# Patient Record
Sex: Female | Born: 1950
Health system: Southern US, Community
[De-identification: ages and names within clinical notes are randomized; demographics above are authoritative.]

## PROBLEM LIST (undated history)

## (undated) DIAGNOSIS — E039 Hypothyroidism, unspecified: Secondary | ICD-10-CM

## (undated) DIAGNOSIS — M5136 Other intervertebral disc degeneration, lumbar region: Secondary | ICD-10-CM

## (undated) DIAGNOSIS — I10 Essential (primary) hypertension: Secondary | ICD-10-CM

## (undated) DIAGNOSIS — K219 Gastro-esophageal reflux disease without esophagitis: Secondary | ICD-10-CM

## (undated) DIAGNOSIS — E079 Disorder of thyroid, unspecified: Secondary | ICD-10-CM

## (undated) DIAGNOSIS — T7840XA Allergy, unspecified, initial encounter: Secondary | ICD-10-CM

## (undated) DIAGNOSIS — M51369 Other intervertebral disc degeneration, lumbar region without mention of lumbar back pain or lower extremity pain: Secondary | ICD-10-CM

## (undated) DIAGNOSIS — IMO0002 Reserved for concepts with insufficient information to code with codable children: Secondary | ICD-10-CM

## (undated) DIAGNOSIS — J45909 Unspecified asthma, uncomplicated: Secondary | ICD-10-CM

## (undated) HISTORY — DX: Reserved for concepts with insufficient information to code with codable children: IMO0002

## (undated) HISTORY — PX: DILATION AND CURETTAGE OF UTERUS: SHX78

## (undated) HISTORY — DX: Allergy, unspecified, initial encounter: T78.40XA

## (undated) HISTORY — PX: ABDOMINAL HYSTERECTOMY: SHX81

---

## 2000-09-16 ENCOUNTER — Other Ambulatory Visit: Admission: RE | Admit: 2000-09-16 | Discharge: 2000-09-16 | Payer: Self-pay | Admitting: Obstetrics and Gynecology

## 2000-09-30 ENCOUNTER — Encounter: Payer: Self-pay | Admitting: Obstetrics and Gynecology

## 2000-09-30 ENCOUNTER — Ambulatory Visit (HOSPITAL_COMMUNITY): Admission: RE | Admit: 2000-09-30 | Discharge: 2000-09-30 | Payer: Self-pay | Admitting: Obstetrics and Gynecology

## 2000-12-17 ENCOUNTER — Encounter (INDEPENDENT_AMBULATORY_CARE_PROVIDER_SITE_OTHER): Payer: Self-pay

## 2000-12-17 ENCOUNTER — Observation Stay (HOSPITAL_COMMUNITY): Admission: RE | Admit: 2000-12-17 | Discharge: 2000-12-18 | Payer: Self-pay | Admitting: Obstetrics and Gynecology

## 2003-10-18 ENCOUNTER — Ambulatory Visit (HOSPITAL_COMMUNITY): Admission: RE | Admit: 2003-10-18 | Discharge: 2003-10-18 | Payer: Self-pay | Admitting: Family Medicine

## 2004-04-25 ENCOUNTER — Ambulatory Visit: Payer: Self-pay | Admitting: Family Medicine

## 2004-11-30 ENCOUNTER — Ambulatory Visit: Payer: Self-pay | Admitting: Family Medicine

## 2005-07-12 ENCOUNTER — Ambulatory Visit: Payer: Self-pay | Admitting: Family Medicine

## 2005-07-24 ENCOUNTER — Ambulatory Visit: Payer: Self-pay | Admitting: Family Medicine

## 2005-08-28 ENCOUNTER — Ambulatory Visit: Payer: Self-pay | Admitting: Family Medicine

## 2005-09-06 ENCOUNTER — Ambulatory Visit: Payer: Self-pay | Admitting: Family Medicine

## 2006-01-22 ENCOUNTER — Ambulatory Visit: Payer: Self-pay | Admitting: Family Medicine

## 2006-02-18 ENCOUNTER — Ambulatory Visit: Payer: Self-pay | Admitting: Family Medicine

## 2006-03-05 ENCOUNTER — Ambulatory Visit: Payer: Self-pay | Admitting: Family Medicine

## 2006-07-02 ENCOUNTER — Ambulatory Visit: Payer: Self-pay | Admitting: Family Medicine

## 2010-06-23 NOTE — Op Note (Signed)
[facility]  Patient:    Diane Grant, Diane Grant Visit Number: [id number] MRN: [id number]          Service Type: GYN Location: 9300 [id number] Attending Physician:  Leonard Schwartz Dictated by:   Diane Grant, M.D. Proc. Date: 12/17/00 Admit Date:  12/17/2000                             Operative Report  PREOPERATIVE DIAGNOSES:       1. Fibroid uterus.                               2. Menorrhagia.                               3. Dysmenorrhea.                               4. Dyspareunia.  POSTOPERATIVE DIAGNOSES:      1. Fibroid uterus.                               2. Menorrhagia.                               3. Dysmenorrhea.                               4. Dyspareunia.  PROCEDURE:                    1. Vaginal hysterectomy.                               2. Bilateral salpingo-oophorectomy.                               3. Cystoscopy.  SURGEON:                      Diane Grant, M.D.  FIRST ASSISTANT:              Elmira J. Lowell Guitar, PA-C  ANESTHESIA:                   General.  DISPOSITION:                  Ms. Diane Grant is a 60 year old female para 3-0-0-3 who presents with the above mentioned diagnoses.  She understands the indications for her procedure and she accepts the risks of, but not limited to, anesthetic complications, bleeding, infections, and possible damage to the surrounding organs.  FINDINGS:                     The uterus was upper limits normal size.  There were two small fibroids noted in the fundus of the uterus.  The fallopian tubes and the ovaries appeared normal.  Cystoscopy was performed at the end of our procedure.  The patient was noted to have dye that extruded from both ureteral orifices.  The bladder was inspected and there  was no evidence of damage.  PROCEDURE:                    The patient was taken to the operating room where a general anesthetic was given.  The patients abdomen, perineum,  and vagina were prepped with multiple layers of Betadine.  A Foley catheter was placed in the bladder.  Examination under anesthesia was performed.  The patient was sterilely draped.  A weighted speculum was placed in the posterior vagina.  The cervix was injected with 40 cc of a diluted solution of Pitressin and saline.  A circumferential incision was made around the cervix and the mucosa was advanced both anteriorly and posteriorly.  The anterior cul-de-sac was sharply entered.  The posterior cul-de-sac was sharply entered. Alternating from right to the left the uterosacral ligaments, paracervical tissues, parametrial tissues, and uterine arteries were clamped, cut, sutured, and tied securely.  The uterus was inverted through the posterior colpotomy. The upper pedicles were clamped and cut.  The uterus was removed from the operative field.  The upper pedicles were then carefully inspected.  The left ovary was brought into the operative field as was the left fallopian tube. The left infundibulopelvic ligament was identified and then clamped.  The fallopian tube and the ovary were cut and removed from the operative field. The left infundibulopelvic ligament was secured using a free tie and then a suture ligature.  An identical procedure was carried out on the opposite side. Hemostasis was achieved in the pelvis using figure-of-eight sutures.  The sutures attached to the uterosacral ligament were then brought out through the vaginal angles and tied securely.  A McCall culdoplasty suture was placed in the posterior cul-de-sac incorporating the uterosacral ligaments bilaterally and the posterior peritoneum.  A final check was made for hemostasis and hemostasis was noted to be adequate in the pelvis.  The vaginal cuff was closed using figure-of-eight sutures incorporating the anterior vaginal mucosa, the anterior peritoneum, the posterior peritoneum, and then the posterior vaginal mucosa.  The  McCall culdoplasty suture was tied securely and the apex of the vagina was noted to elevate into the mid pelvis.  Vicryl 0 was the suture material used throughout the procedure.  Sponge, needle, and instrument counts were correct on two occasions.  The patient was noted to drain clear yellow urine.  The estimated blood loss for the procedure was 150 cc.  We then gave the patient indigo carmine dye.  The diagnostic cystoscope was placed in the bladder and blue dye was noted to come from both ureteral orifices.  The dome of the bladder appeared normal.  The Foley catheter was then reinserted.  The patient was awakened from her general anesthetic and taken to the recovery room in stable condition. Dictated by:   Diane Grant, M.D. Attending Physician:  Leonard Schwartz DD:  12/17/00 TD:  12/17/00 Job: [id number]

## 2010-06-23 NOTE — Discharge Summary (Signed)
[facility]  Patient:    Diane Grant, Diane Grant Visit Number: [id number] MRN: [id number]          Service Type: GYN Location: 9300 [id number] Attending Physician:  Leonard Schwartz Dictated by:   Henreitta Leber, P.A.-C Admit Date:  12/17/2000 Discharge Date: 12/18/2000                             Discharge Summary  DISCHARGE DIAGNOSES: 1. Fibroid uterus. 2. Menorrhagia. 3. Dysmenorrhea. 4. Dyspareunia. 5. Hypothyroidism. 6. Anemia.  OPERATION:  On the date of admission the patient underwent a total vaginal hysterectomy with a bilateral salpingo-oophorectomy, followed by cystoscopy. The patient tolerated all procedures well.  HISTORY OF PRESENT ILLNESS:  Diane Grant is a 60 year old female, para 3-0-0-3, who presents for vaginal hysterectomy because of dysmenorrhea, menorrhagia, and fibroids.  Please see the patients dictated history and physical examination for details.  PHYSICAL EXAMINATION:  VITAL SIGNS:  Weight 151 pounds.  GENERAL:  Within normal limits.  PELVIC:  EGBUS is within normal limits.  Vagina is normal except for pelvic relaxation.  Uterus is 10 to 12 weeks size, irregular, and firm.  Adnexa without masses.  Rectovaginal examination confirms.  HOSPITAL COURSE:  On the date of admission the patient underwent the afore mentioned procedures, tolerating them all well.  Postoperative course was marked by a spike in temperature on postoperative day #1 to 100.6 degrees Fahrenheit orally, however, the patient quickly defervesced.  By the afternoon of postoperative day #1, the patient had resumed bowel and bladder function, and was deemed ready for discharge home.  Postoperative hemoglobin 10.5 (preoperative hemoglobin 12.8).  DISCHARGE MEDICATIONS: 1. Vicodin one or two tablets q.4-6h. p.r.n. pain. 2. Iron 325 mg one tablet b.i.d. x 6 weeks. 3. Ibuprofen 600 mg one tablet with food q.6h. x 5 days, then p.r.n. 4. Phenergan  12.5 mg one tablet q.i.d. p.r.n. for nausea. 5. Stool softeners 100 mg b.i.d. until bowel movements are regular. 6. Synthroid 100 mcg q.d.  DISCHARGE INSTRUCTIONS: 1. The patient was given a copy of [facility] Gynecology    postoperative instruction sheet. 2. She was further advised to avoid driving for 2 weeks, heavy lifting for 4    weeks, and intercourse for 6 weeks.  FOLLOWUP:  The patient is to call [facility] for a 6 week postoperative examination with Dr. Kirkland Hun.  FINAL PATHOLOGY: 1. Uterus:  Nonspecific chronic erosive endocervicitis, benign proliferative    endometrium associated with benign endometrial polyps (two), and benign    uterine leiomyoma. 2. Right ovary and fallopian tube:  Ovary and fallopian tube with no    pathologic abnormalities identified. 3. Left ovary and fallopian tube:  Tube and ovary with few fibrous adhesions. Dictated by:   Henreitta Leber, P.A.-C Attending Physician:  Leonard Schwartz DD:  12/31/00 TD:  12/31/00 Job: [id number]

## 2010-06-23 NOTE — H&P (Signed)
[facility]  Patient:    Diane Grant, Diane Grant Visit Number: [id number] MRN: [id number]          Service Type: Attending:  Janine Limbo, M.D. Dictated by:   Janine Limbo, M.D. Adm. Date:  12/17/00                           History and Physical  HISTORY OF PRESENT ILLNESS:   Ms. Romeo Apple is a 60 year old female, para 3-0-0-3, who presents for a vaginal hysterectomy because of dysmenorrhea, menorrhagia, and fibroids.  An endometrial biopsy was performed and it showed benign endometrial tissue.  Her most recent Pap smear was within normal limits.  Gonorrhea, chlamydia, and urine cultures were negative.  An ultrasound showed normal ovaries.  The uterus had two fibroids noted.  The patient has had a tubal ligation in the past.  She has tried hormonal management, as well as nonsteroidal anti-inflammatory agents.  These have not managed her discomfort.  She wants to proceed with definitive therapy.  PAST MEDICAL HISTORY:         The patient has a history of hypothyroidism and she currently takes Synthroid 100 mcg each day.  She had a broken leg as a child, but has no sequelae from this.  She has been told that her cholesterol was slightly high.  OBSTETRICAL HISTORY:          The patient has had three vaginal deliveries at term.  SOCIAL HISTORY:               The patient denies cigarette use, alcohol use, and recreational drug use.  DRUG ALLERGIES:               No known drug allergies.  REVIEW OF SYSTEMS:            The patient does complain of dyspareunia.  She has pelvic pressure symptoms and urinary incontinence.  FAMILY HISTORY:               Noncontributory.  PHYSICAL EXAMINATION:         Weight is 151 pounds.  HEENT:                        Within normal limits.  CHEST:                        Clear.  HEART:                        Regular rate and rhythm.  BREASTS:                      Without masses.  ABDOMEN:                       Nontender.  EXTREMITIES:                  Within normal limits.  NEUROLOGIC EXAMINATION:       Grossly normal.  PELVIC EXAMINATION:           External genitalia are normal.  Vagina is normal, except for pelvic relaxation.  Uterus is 10-12 week size, irregular, and firm.  Adnexa no masses and rectovaginal examination confirms.  ASSESSMENT:                   1. Fibroid uterus.  2. Menorrhagia.                               3. Dysmenorrhea.                               4. Dyspareunia.                               5. Hypothyroidism.  PLAN:                         The patient will undergo a vaginal hysterectomy. She understands the indications for her procedure and she accepts the risks of, but not limited to, anesthetic complications, bleeding, infection, and possible damage to the surrounding organs.  We discussed the merit of bilateral oophorectomy.  The patient declined.  We also discussed surgical repair of urinary incontinence and the patient declined. Dictated by:   Janine Limbo, M.D. Attending:  Janine Limbo, M.D. DD:  12/16/00 TD:  12/16/00 Job: [id number]

## 2013-06-26 DIAGNOSIS — E079 Disorder of thyroid, unspecified: Secondary | ICD-10-CM | POA: Insufficient documentation

## 2013-06-26 DIAGNOSIS — I10 Essential (primary) hypertension: Secondary | ICD-10-CM | POA: Insufficient documentation

## 2013-06-26 DIAGNOSIS — E039 Hypothyroidism, unspecified: Secondary | ICD-10-CM | POA: Insufficient documentation

## 2013-10-07 ENCOUNTER — Other Ambulatory Visit: Payer: Self-pay

## 2013-10-07 DIAGNOSIS — Z1231 Encounter for screening mammogram for malignant neoplasm of breast: Secondary | ICD-10-CM

## 2013-10-22 ENCOUNTER — Ambulatory Visit
Admission: RE | Admit: 2013-10-22 | Discharge: 2013-10-22 | Disposition: A | Discharge status: Home / Self Care | Payer: BC Managed Care – PPO | Source: Ambulatory Visit

## 2013-10-22 DIAGNOSIS — Z1231 Encounter for screening mammogram for malignant neoplasm of breast: Secondary | ICD-10-CM

## 2014-05-06 ENCOUNTER — Encounter (HOSPITAL_COMMUNITY): Payer: Self-pay | Admitting: *Deleted

## 2014-05-06 ENCOUNTER — Emergency Department (HOSPITAL_COMMUNITY)
Admission: EM | Admit: 2014-05-06 | Discharge: 2014-05-06 | Disposition: A | Discharge status: Home / Self Care | Payer: BLUE CROSS/BLUE SHIELD | Attending: Emergency Medicine | Admitting: Emergency Medicine

## 2014-05-06 ENCOUNTER — Emergency Department (HOSPITAL_COMMUNITY): Payer: BLUE CROSS/BLUE SHIELD

## 2014-05-06 DIAGNOSIS — I10 Essential (primary) hypertension: Secondary | ICD-10-CM | POA: Insufficient documentation

## 2014-05-06 DIAGNOSIS — Z79899 Other long term (current) drug therapy: Secondary | ICD-10-CM | POA: Insufficient documentation

## 2014-05-06 DIAGNOSIS — R2243 Localized swelling, mass and lump, lower limb, bilateral: Secondary | ICD-10-CM | POA: Insufficient documentation

## 2014-05-06 DIAGNOSIS — E039 Hypothyroidism, unspecified: Secondary | ICD-10-CM | POA: Diagnosis not present

## 2014-05-06 DIAGNOSIS — R0789 Other chest pain: Secondary | ICD-10-CM | POA: Insufficient documentation

## 2014-05-06 DIAGNOSIS — R079 Chest pain, unspecified: Secondary | ICD-10-CM | POA: Diagnosis present

## 2014-05-06 DIAGNOSIS — Z8739 Personal history of other diseases of the musculoskeletal system and connective tissue: Secondary | ICD-10-CM | POA: Insufficient documentation

## 2014-05-06 HISTORY — DX: Other intervertebral disc degeneration, lumbar region without mention of lumbar back pain or lower extremity pain: M51.369

## 2014-05-06 HISTORY — DX: Disorder of thyroid, unspecified: E07.9

## 2014-05-06 HISTORY — DX: Other intervertebral disc degeneration, lumbar region: M51.36

## 2014-05-06 HISTORY — DX: Essential (primary) hypertension: I10

## 2014-05-06 LAB — CBC
HEMATOCRIT: 42.9 % (ref 36.0–46.0)
HEMOGLOBIN: 15 g/dL (ref 12.0–15.0)
MCH: 31.1 pg (ref 26.0–34.0)
MCHC: 35 g/dL (ref 30.0–36.0)
MCV: 89 fL (ref 78.0–100.0)
Platelets: 271 10*3/uL (ref 150–400)
RBC: 4.82 MIL/uL (ref 3.87–5.11)
RDW: 12.6 % (ref 11.5–15.5)
WBC: 9.4 10*3/uL (ref 4.0–10.5)

## 2014-05-06 LAB — COMPREHENSIVE METABOLIC PANEL
ALK PHOS: 70 U/L (ref 39–117)
ALT: 20 U/L (ref 0–35)
AST: 28 U/L (ref 0–37)
Albumin: 4.1 g/dL (ref 3.5–5.2)
Anion gap: 12 (ref 5–15)
BUN: 9 mg/dL (ref 6–23)
CO2: 25 mmol/L (ref 19–32)
Calcium: 9.8 mg/dL (ref 8.4–10.5)
Chloride: 103 mmol/L (ref 96–112)
Creatinine, Ser: 0.69 mg/dL (ref 0.50–1.10)
GLUCOSE: 91 mg/dL (ref 70–99)
POTASSIUM: 3.9 mmol/L (ref 3.5–5.1)
Sodium: 140 mmol/L (ref 135–145)
Total Bilirubin: 0.5 mg/dL (ref 0.3–1.2)
Total Protein: 7.4 g/dL (ref 6.0–8.3)

## 2014-05-06 LAB — BRAIN NATRIURETIC PEPTIDE: B Natriuretic Peptide: 41.2 pg/mL (ref 0.0–100.0)

## 2014-05-06 LAB — I-STAT TROPONIN, ED: Troponin i, poc: 0 ng/mL (ref 0.00–0.08)

## 2014-05-06 NOTE — ED Provider Notes (Signed)
CSN: [id number]     Arrival date & time 05/06/14  1608 History   First MD Initiated Contact with Patient 05/06/14 1721     Chief Complaint  Patient presents with  . Chest Pain   Diane Grant is a 65 y.o. female with a history of hypertension, degenerative disc disease and hypothyroidism who presents the emergency department complaining of sharp left-sided chest pain that lasted seconds several hours ago. Now she reports having an uncomfortable feeling in her chest, and denies pain. The patient reports she was standing around 11 AM when she had sharp chest pain in the left side of her chest that resolved within seconds. She reports that since she just has an uncomfortable feeling in her chest. She denies any shortness of breath today or associated with this episode. Currently she rates her pain at 0 out of 10. She reports lots of belching today. The patient reports some bilateral ankle edema for many years that has not worsened or changed. The patient reports she came to the emergency department this evening because her primary care provider nurse encouraged her to do so. Patient denies having any pain on exertion. The patient denies history of MI. The patient does report a history of a brother with an MI in his 51s. The patient denies fevers, chills, cough, shortness of breath, palpitations, abdominal pain, nausea, vomiting, recent surgeries, exogenous estrogen use, smoking, or long travel. The patient denies personal or family history of DVTs or PEs. The patient denies personal or family history of blood clotting disorders such as factor V Leiden, protein C or S deficiency.  (Consider location/radiation/quality/duration/timing/severity/associated sxs/prior Treatment) HPI  Past Medical History  Diagnosis Date  . Thyroid disease     hypothyroidism  . Hypertension   . Degenerative disc disease, lumbar    Past Surgical History  Procedure Laterality Date  . Abdominal hysterectomy     No  family history on file. History  Substance Use Topics  . Smoking status: Never Smoker   . Smokeless tobacco: Not on file  . Alcohol Use: No   OB History    No data available     Review of Systems  Constitutional: Negative for fever and chills.  HENT: Negative for congestion, ear pain and sore throat.   Eyes: Negative for pain and visual disturbance.  Respiratory: Negative for cough, shortness of breath and wheezing.   Cardiovascular: Positive for chest pain. Negative for palpitations and leg swelling.  Gastrointestinal: Negative for nausea, vomiting, abdominal pain and diarrhea.  Genitourinary: Negative for dysuria.  Musculoskeletal: Negative for back pain and neck pain.  Skin: Negative for rash.  Neurological: Negative for dizziness, weakness, light-headedness, numbness and headaches.      Allergies  Review of patient's allergies indicates no known allergies.  Home Medications   Prior to Admission medications   Medication Sig Start Date End Date Taking? Authorizing Provider  levothyroxine (SYNTHROID, LEVOTHROID) 75 MCG tablet Take 75 mcg by mouth daily before breakfast.   Yes Historical Provider, MD  losartan-hydrochlorothiazide (HYZAAR) 100-25 MG per tablet Take 1 tablet by mouth daily.   Yes Historical Provider, MD   BP 121/74 mmHg  Pulse 90  Temp(Src) 98.3 F (36.8 C) (Oral)  Resp 24  Ht 5\' 3"  (1.6 m)  Wt 169 lb (76.658 kg)  BMI 29.94 kg/m2  SpO2 100% Physical Exam  Constitutional: She is oriented to person, place, and time. She appears well-developed and well-nourished. No distress.  Nontoxic appearing.  HENT:  Head: Normocephalic  and atraumatic.  Right Ear: External ear normal.  Left Ear: External ear normal.  Mouth/Throat: Oropharynx is clear and moist. No oropharyngeal exudate.  Eyes: Conjunctivae are normal. Pupils are equal, round, and reactive to light. Right eye exhibits no discharge. Left eye exhibits no discharge.  Neck: Neck supple. No JVD present.  No tracheal deviation present.  Cardiovascular: Normal rate, regular rhythm, normal heart sounds and intact distal pulses.  Exam reveals no gallop and no friction rub.   No murmur heard. Bilateral radial, posterior tibialis and dorsalis pedis pulses are intact.   Pulmonary/Chest: Effort normal and breath sounds normal. No respiratory distress. She has no wheezes. She has no rales. She exhibits no tenderness.  Abdominal: Soft. Bowel sounds are normal. She exhibits no distension. There is no tenderness.  Musculoskeletal: She exhibits edema. She exhibits no tenderness.  Very mild bilateral ankle edema. No calf edema or tenderness.  Lymphadenopathy:    She has no cervical adenopathy.  Neurological: She is alert and oriented to person, place, and time. Coordination normal.  Skin: Skin is warm and dry. No rash noted. She is not diaphoretic. No erythema. No pallor.  Psychiatric: She has a normal mood and affect. Her behavior is normal.  Nursing note and vitals reviewed.   ED Course  Procedures (including critical care time) Labs Review Labs Reviewed  CBC  BRAIN NATRIURETIC PEPTIDE  COMPREHENSIVE METABOLIC PANEL  I-STAT TROPOININ, ED    Imaging Review Dg Chest 2 View  05/06/2014   CLINICAL DATA:  64 year old female with left-sided chest pain and posterior left shoulder pain with some associated shortness of breath for 1 day.  EXAM: CHEST  2 VIEW  COMPARISON:  Chest x-ray 03/24/2009.  FINDINGS: Lung volumes are normal. No consolidative airspace disease. No pleural effusions. No pneumothorax. No pulmonary nodule or mass noted. Pulmonary vasculature and the cardiomediastinal silhouette are within normal limits.  IMPRESSION: No radiographic evidence of acute cardiopulmonary disease.   Electronically Signed   By: Vinnie Langton M.D.   On: 05/06/2014 17:08     EKG Interpretation   Date/Time:  Thursday May 06 2014 16:29:10 EDT Ventricular Rate:  84 PR Interval:  150 QRS Duration: 78 QT  Interval:  360 QTC Calculation: 425 R Axis:   55 Text Interpretation:  Normal sinus rhythm with sinus arrhythmia Normal ECG  No previous tracing Confirmed by ZACKOWSKI  MD, SCOTT ([id number]) on 05/06/2014  7:04:15 PM      Filed Vitals:   05/06/14 1630 05/06/14 1800 05/06/14 1815 05/06/14 1830  BP:  103/71 107/73 121/74  Pulse:  78 77 90  Temp:      TempSrc:      Resp:  19 10 24   Height: 5\' 3"  (1.6 m)     Weight: 169 lb (76.658 kg)     SpO2:  98% 98% 100%     MDM   Meds given in ED:  Medications - No data to display  New Prescriptions   No medications on file    Final diagnoses:  Atypical chest pain   This is a 64 y.o. female with a history of hypertension, degenerative disc disease and hypothyroidism who presents the emergency department complaining of sharp left-sided chest pain that lasted seconds several hours ago. Now she reports having an uncomfortable feeling in her chest, and denies pain she denies any shortness of breath. Patient has chronic ankle edema that is not worsened or changed. Patient is afebrile and nontoxic appearing. She has a HEART score of 2.  Patient is to be discharged with recommendation to follow up with PCP in regards to today's hospital visit. Chest pain is not likely of cardiac or pulmonary etiology d/t presentation, VSS, no tracheal deviation, no JVD or new murmur, RRR, breath sounds equal bilaterally, EKG without acute abnormalities, negative troponin, and negative CXR. Pt has been advised to return to the ED if CP becomes exertional, associated with diaphoresis or nausea, radiates to left jaw/arm, worsens or becomes concerning in any way. Pt appears reliable for follow up and is agreeable to discharge. I advised the patient to follow-up with their primary care provider this week. I advised the patient to return to the emergency department with new or worsening symptoms or new concerns. The patient verbalized understanding and agreement with plan.     This patient was discussed with Dr. Rogene Houston who agrees with assessment and plan.   Waynetta Pean, PA-C 05/06/14 1915  Fredia Sorrow, MD 05/11/14 [phone]

## 2014-05-06 NOTE — ED Notes (Addendum)
Pt states pain from L scapula to L shoulder and pain sharp pain behind L breast that is sharp and takes her breath away.  States hx of chronic back pain, but this does not feel the same.  Pt also c/o LE swelling and sob (which she attributed to allergies).

## 2014-05-06 NOTE — ED Notes (Signed)
Pt reports she was standing talking to her husband this afternoon when she had a sudden sharp pain right behind her left breast.  Pt reports the pain took her breath away and lasted aprox. 1 minute.  Pt denies any n/v/d, dizziness or diaphoresis.  Pt denies any pain at this time.

## 2014-05-06 NOTE — Discharge Instructions (Signed)

## 2015-06-15 ENCOUNTER — Other Ambulatory Visit (HOSPITAL_COMMUNITY): Payer: Self-pay | Admitting: Physician Assistant

## 2015-06-15 DIAGNOSIS — Z1231 Encounter for screening mammogram for malignant neoplasm of breast: Secondary | ICD-10-CM

## 2015-09-16 ENCOUNTER — Ambulatory Visit (HOSPITAL_COMMUNITY)
Admission: RE | Admit: 2015-09-16 | Discharge: 2015-09-16 | Disposition: A | Discharge status: Home / Self Care | Payer: Medicare Other | Source: Ambulatory Visit | Attending: Physician Assistant | Admitting: Physician Assistant

## 2015-09-16 DIAGNOSIS — Z1231 Encounter for screening mammogram for malignant neoplasm of breast: Secondary | ICD-10-CM | POA: Insufficient documentation

## 2015-10-26 ENCOUNTER — Ambulatory Visit (HOSPITAL_COMMUNITY)
Admission: RE | Admit: 2015-10-26 | Discharge: 2015-10-26 | Disposition: A | Discharge status: Home / Self Care | Payer: Medicare Other | Source: Ambulatory Visit | Attending: Family Medicine | Admitting: Family Medicine

## 2015-10-26 ENCOUNTER — Other Ambulatory Visit (HOSPITAL_COMMUNITY): Payer: Self-pay | Admitting: Family Medicine

## 2015-10-26 DIAGNOSIS — M12811 Other specific arthropathies, not elsewhere classified, right shoulder: Secondary | ICD-10-CM | POA: Diagnosis not present

## 2015-10-26 DIAGNOSIS — M719 Bursopathy, unspecified: Secondary | ICD-10-CM

## 2015-11-15 ENCOUNTER — Other Ambulatory Visit (HOSPITAL_COMMUNITY): Payer: Self-pay | Admitting: Family Medicine

## 2015-11-15 DIAGNOSIS — M719 Bursopathy, unspecified: Secondary | ICD-10-CM

## 2015-11-21 ENCOUNTER — Ambulatory Visit (HOSPITAL_COMMUNITY)
Admission: RE | Admit: 2015-11-21 | Discharge: 2015-11-21 | Disposition: A | Discharge status: Home / Self Care | Payer: Medicare Other | Source: Ambulatory Visit | Attending: Family Medicine | Admitting: Family Medicine

## 2015-11-21 DIAGNOSIS — M719 Bursopathy, unspecified: Secondary | ICD-10-CM | POA: Diagnosis present

## 2015-11-21 DIAGNOSIS — M62521 Muscle wasting and atrophy, not elsewhere classified, right upper arm: Secondary | ICD-10-CM | POA: Diagnosis not present

## 2015-11-21 DIAGNOSIS — M75111 Incomplete rotator cuff tear or rupture of right shoulder, not specified as traumatic: Secondary | ICD-10-CM | POA: Diagnosis not present

## 2015-11-30 HISTORY — PX: ROTATOR CUFF REPAIR: SHX139

## 2016-03-28 ENCOUNTER — Other Ambulatory Visit (HOSPITAL_COMMUNITY): Payer: Self-pay | Admitting: Physician Assistant

## 2016-03-28 DIAGNOSIS — Z780 Asymptomatic menopausal state: Secondary | ICD-10-CM

## 2016-04-04 ENCOUNTER — Ambulatory Visit (HOSPITAL_COMMUNITY)
Admission: RE | Admit: 2016-04-04 | Discharge: 2016-04-04 | Disposition: A | Discharge status: Home / Self Care | Payer: Medicare Other | Source: Ambulatory Visit | Attending: Physician Assistant | Admitting: Physician Assistant

## 2016-04-04 DIAGNOSIS — Z780 Asymptomatic menopausal state: Secondary | ICD-10-CM | POA: Insufficient documentation

## 2017-03-01 NOTE — Progress Notes (Signed)
Subjective: GU:[id number] care, hypothyroidism HPI: Diane Grant is a 67 y.o. female presenting to clinic today for:  1. Hypothyroidism Patient reports that she was diagnosed with hypothyroidism about 15 years ago.  She reports that this was discovered on blood labs.  She denies h/o thyroid surgery or exposure to radiation.  No known medications that may have decreased thyroid function.  She reports compliance with synthroid.  She reports energy is fair.  She notes occasional constipation.  She reports that over the last 3-6 months she has been having changes in swallowing and deepening of voice.  She is concerned that this may be related to her thyroid.  Family history is negative for thyroid disorders and negative for thyroid cancer.  2.  Chronic low back pain Patient reports that she has degenerative disc disease in the lumbar spine.  She notes that this was found on MRI previously.  She actually has been seen by orthopedist in the past who did an injection of her back.  She notes that this did not help.  She is gone through physical therapy as well.  She reports that she intermittently uses meloxicam 7.5 mg and Robaxin 500 mg for flares.  She notes that she seldom has a flare but when she does it usually severe.  Denies saddle anesthesia, fecal incontinence or urinary retention.  Occasionally feels like the left lower extremity is weaker than the right, she notes that this is where the predominant pain tends to radiate.  Denies falls or previous injury.  Past Medical History:  Diagnosis Date  . Degenerative disc disease, lumbar   . Hypertension   . Thyroid disease    hypothyroidism   Past Surgical History:  Procedure Laterality Date  . ABDOMINAL HYSTERECTOMY    . ROTATOR CUFF REPAIR  11/30/2015   Right shoulder   Social History   Socioeconomic History  . Marital status: Married    Spouse name: Not on file  . Number of children: Not on file  . Years of education: Not on file   . Highest education level: Not on file  Social Needs  . Financial resource strain: Not on file  . Food insecurity - worry: Not on file  . Food insecurity - inability: Not on file  . Transportation needs - medical: Not on file  . Transportation needs - non-medical: Not on file  Occupational History  . Not on file  Tobacco Use  . Smoking status: Never Smoker  . Smokeless tobacco: Never Used  Substance and Sexual Activity  . Alcohol use: No  . Drug use: No  . Sexual activity: Not on file  Other Topics Concern  . Not on file  Social History Narrative  . Not on file   Current Meds  Medication Sig  . levothyroxine (SYNTHROID, LEVOTHROID) 75 MCG tablet Take 75 mcg by mouth daily before breakfast.  . losartan-hydrochlorothiazide (HYZAAR) 100-25 MG per tablet Take 1 tablet by mouth daily.   Family History  Problem Relation Age of Onset  . Arthritis Mother   . Heart attack Father   . Asthma Sister   . Asthma Maternal Grandmother   . Heart attack Maternal Grandmother    Allergies  Allergen Reactions  . Anesthesia S-I-40 [Propofol]     Per patient need to be careful when giving this    ROS: Per HPI  Objective: Office vital signs reviewed. BP 125/76   Pulse 73   Temp (!) 97.1 F (36.2 C) (Oral)   Ht  5\' 3"  (1.6 m)   Wt 162 lb (73.5 kg)   BMI 28.70 kg/m   Physical Examination:  General: Awake, alert, well nourished, No acute distress HEENT: Normal    Neck: No masses palpated. No lymphadenopathy; thyroid not enlarged.  No palpable nodules.    Eyes: PERRLA, extraocular movement in tact, sclera white, no exophthalmos    Throat: moist mucus membranes, no erythema, no tonsillar exudate.  Airway is patent.  No visible masses. Cardio: regular rate and rhythm, S1S2 heard, no murmurs appreciated Pulm: clear to auscultation bilaterally, no wheezes, rhonchi or rales; normal work of breathing on room air Extremities: warm, well perfused, No edema, cyanosis or clubbing; + 2 pulses  bilaterally MSK: Normal gait and normal station Skin: dry; intact; no rashes or lesions; normal temperature Neuro: No focal neurologic deficits.  No tremor noted.  Assessment/ Plan: 67 y.o. female   1. Acquired hypothyroidism Given globus sensation and change in voice, will obtain an ultrasound of the thyroid.  May need to consider a CT of the neck.  Check TSH.  For now, continue current dose of Synthroid.  Will consider referring to ear nose and throat versus endocrinology pending ultrasound results.  Strict return precautions reviewed with the patient.  She was good understanding. - TSH - US THYROID; Future  2. Establishing care with new doctor, encounter for We will need to obtain colonoscopy results from GI provider at some point.  She notes history of polyps.  3. Essential hypertension Well-controlled.  No refills needed today.  4. Globus sensation See above - US THYROID; Future  5. Change in voice See above - US THYROID; Future  6. Degenerative disc disease, lumbar Not currently in flare.  She has Robaxin and meloxicam at home if needed.  I did recommend that she consider X strength Tylenol for maintenance rather than depending on muscle relaxer and NSAID if possible.   [name], DO [place] [phone]

## 2017-03-08 ENCOUNTER — Encounter: Payer: Self-pay | Admitting: Family Medicine

## 2017-03-08 ENCOUNTER — Ambulatory Visit (INDEPENDENT_AMBULATORY_CARE_PROVIDER_SITE_OTHER): Payer: Medicare Other | Admitting: Family Medicine

## 2017-03-08 VITALS — BP 125/76 | HR 73 | Temp 97.1°F | Ht 63.0 in | Wt 162.0 lb

## 2017-03-08 DIAGNOSIS — R0989 Other specified symptoms and signs involving the circulatory and respiratory systems: Secondary | ICD-10-CM

## 2017-03-08 DIAGNOSIS — Z7689 Persons encountering health services in other specified circumstances: Secondary | ICD-10-CM

## 2017-03-08 DIAGNOSIS — I10 Essential (primary) hypertension: Secondary | ICD-10-CM

## 2017-03-08 DIAGNOSIS — E039 Hypothyroidism, unspecified: Secondary | ICD-10-CM

## 2017-03-08 DIAGNOSIS — F458 Other somatoform disorders: Secondary | ICD-10-CM

## 2017-03-08 DIAGNOSIS — M5136 Other intervertebral disc degeneration, lumbar region: Secondary | ICD-10-CM

## 2017-03-08 DIAGNOSIS — R499 Unspecified voice and resonance disorder: Secondary | ICD-10-CM | POA: Diagnosis not present

## 2017-03-08 NOTE — Patient Instructions (Addendum)
It was a pleasure seeing you today, Diane Grant.  Please schedule an Annual Wellness Visit.  This will be due 03/28/2017.  You are up to date on labs except thyroid testing until 06/13/2017.  We can repeat your cholesterol, etc at that time.  If you have not had colon cancer screening, please do so.  Schedule your mammogram up front when you check out.  I have ordered an ultrasound of your thyroid.  You will be contacted to schedule this.

## 2017-03-09 LAB — TSH: TSH: 4.16 u[IU]/mL (ref 0.450–4.500)

## 2017-03-13 ENCOUNTER — Ambulatory Visit (HOSPITAL_COMMUNITY)
Admission: RE | Admit: 2017-03-13 | Discharge: 2017-03-13 | Disposition: A | Discharge status: Home / Self Care | Payer: Medicare Other | Source: Ambulatory Visit | Attending: Family Medicine | Admitting: Family Medicine

## 2017-03-13 DIAGNOSIS — F458 Other somatoform disorders: Secondary | ICD-10-CM | POA: Insufficient documentation

## 2017-03-13 DIAGNOSIS — R0989 Other specified symptoms and signs involving the circulatory and respiratory systems: Secondary | ICD-10-CM

## 2017-03-13 DIAGNOSIS — R499 Unspecified voice and resonance disorder: Secondary | ICD-10-CM | POA: Diagnosis not present

## 2017-03-13 DIAGNOSIS — E039 Hypothyroidism, unspecified: Secondary | ICD-10-CM | POA: Insufficient documentation

## 2017-03-13 DIAGNOSIS — E034 Atrophy of thyroid (acquired): Secondary | ICD-10-CM | POA: Diagnosis not present

## 2017-04-01 ENCOUNTER — Encounter: Payer: Self-pay | Admitting: Family Medicine

## 2017-04-01 DIAGNOSIS — Z1231 Encounter for screening mammogram for malignant neoplasm of breast: Secondary | ICD-10-CM | POA: Diagnosis not present

## 2017-04-10 ENCOUNTER — Other Ambulatory Visit: Payer: Self-pay | Admitting: *Deleted

## 2017-04-10 MED ORDER — LEVOTHYROXINE SODIUM 75 MCG PO TABS: 75.0000 ug | ORAL_TABLET | Freq: Every day | ORAL | 3 refills | Status: DC

## 2017-07-18 ENCOUNTER — Other Ambulatory Visit: Payer: Self-pay | Admitting: *Deleted

## 2017-07-18 MED ORDER — LOSARTAN POTASSIUM-HCTZ 100-25 MG PO TABS: 1.0000 | ORAL_TABLET | Freq: Every day | ORAL | 0 refills | Status: DC

## 2017-07-25 ENCOUNTER — Other Ambulatory Visit: Payer: Self-pay

## 2017-07-25 MED ORDER — METHOCARBAMOL 500 MG PO TABS: 500.0000 mg | ORAL_TABLET | Freq: Four times a day (QID) | ORAL | 0 refills | Status: DC | PRN

## 2017-07-25 NOTE — Telephone Encounter (Signed)
Last seen 2/19  Dr Darnell Level

## 2017-09-26 ENCOUNTER — Ambulatory Visit: Payer: Medicare Other | Admitting: Family Medicine

## 2017-10-30 ENCOUNTER — Telehealth: Payer: Self-pay | Admitting: Family Medicine

## 2017-10-30 ENCOUNTER — Ambulatory Visit (INDEPENDENT_AMBULATORY_CARE_PROVIDER_SITE_OTHER): Payer: Medicare Other

## 2017-10-30 ENCOUNTER — Ambulatory Visit (INDEPENDENT_AMBULATORY_CARE_PROVIDER_SITE_OTHER): Payer: Medicare Other | Admitting: Family Medicine

## 2017-10-30 ENCOUNTER — Encounter: Payer: Self-pay | Admitting: Family Medicine

## 2017-10-30 VITALS — BP 110/81 | HR 66 | Temp 97.8°F | Ht 63.0 in | Wt 159.0 lb

## 2017-10-30 DIAGNOSIS — G8929 Other chronic pain: Secondary | ICD-10-CM

## 2017-10-30 DIAGNOSIS — M545 Low back pain: Secondary | ICD-10-CM | POA: Diagnosis not present

## 2017-10-30 DIAGNOSIS — M5136 Other intervertebral disc degeneration, lumbar region: Secondary | ICD-10-CM

## 2017-10-30 DIAGNOSIS — M25512 Pain in left shoulder: Secondary | ICD-10-CM

## 2017-10-30 DIAGNOSIS — M19012 Primary osteoarthritis, left shoulder: Secondary | ICD-10-CM | POA: Diagnosis not present

## 2017-10-30 MED ORDER — PREDNISONE 10 MG (21) PO TBPK: ORAL_TABLET | ORAL | 0 refills | Status: DC

## 2017-10-30 NOTE — Telephone Encounter (Signed)
Pt aware of both xray results

## 2017-10-30 NOTE — Progress Notes (Signed)
Subjective: CC: Back pain PCP: [name], DO PYK:DXIPJAS Diane Grant is a 67 y.o. female presenting to clinic today for:  1.  Chronic low back pain Patient with reports of degenerative disc disease in the lumbar spine.  She was last seen for this issue back in February.  She has been using intermittent meloxicam and Robaxin for flares.  Flares occur typically rarely but she has been having quite a bit of difficulty with low back pain recently.  She has had occasional left lower extremity weakness compared to the right and cites that this is where the pain tends to radiate.  She is been using her meloxicam and Robaxin but states that this has not been helping as much it has previously.  She has had a history of back injection x1 but reports that it was not especially helpful.  2.  Left shoulder pain Patient reports this is been ongoing for several months but she has been trying to ignore it.  She has been taking meloxicam as above with little improvement in symptoms.  She does report some weakness, particularly with lifting.  Pain also seems to be worse with lifting.  Is constantly achy but occasionally sharp.  She does report intermittent numbness and tingling in the pinky and ring finger on the left hand as well.  Denies any preceding injury but states that symptoms feel very similar to when she injured her rotator cuff and required surgical repair on the right previously.  She was being seen by Raliegh Ip for this.   ROS: Per HPI  Allergies  Allergen Reactions  . Anesthesia S-I-40 [Propofol]     Per patient need to be careful when giving this   Past Medical History:  Diagnosis Date  . Degenerative disc disease, lumbar   . Hypertension   . Thyroid disease    hypothyroidism    Current Outpatient Medications:  .  albuterol (PROVENTIL HFA;VENTOLIN HFA) 108 (90 Base) MCG/ACT inhaler, Inhale into the lungs every 4 (four) hours as needed for wheezing or shortness of breath.,  Disp: , Rfl:  .  hydrOXYzine (ATARAX/VISTARIL) 25 MG tablet, Take 25 mg by mouth 3 (three) times daily as needed., Disp: , Rfl:  .  levothyroxine (SYNTHROID, LEVOTHROID) 75 MCG tablet, Take 1 tablet (75 mcg total) by mouth daily before breakfast., Disp: 90 tablet, Rfl: 3 .  losartan-hydrochlorothiazide (HYZAAR) 100-25 MG tablet, Take 1 tablet by mouth daily., Disp: 90 tablet, Rfl: 0 .  meclizine (ANTIVERT) 25 MG tablet, Take 25 mg by mouth 3 (three) times daily as needed for dizziness., Disp: , Rfl:  .  meloxicam (MOBIC) 7.5 MG tablet, Take 7.5 mg by mouth 2 (two) times daily as needed for pain., Disp: , Rfl:  .  methocarbamol (ROBAXIN) 500 MG tablet, Take 1 tablet (500 mg total) by mouth 4 (four) times daily as needed for muscle spasms., Disp: 40 tablet, Rfl: 0 .  omeprazole (PRILOSEC) 20 MG capsule, Take by mouth., Disp: , Rfl:  Social History   Socioeconomic History  . Marital status: Married    Spouse name: Not on file  . Number of children: Not on file  . Years of education: Not on file  . Highest education level: Not on file  Occupational History  . Not on file  Social Needs  . Financial resource strain: Not on file  . Food insecurity:    Worry: Not on file    Inability: Not on file  . Transportation needs:  Medical: Not on file    Non-medical: Not on file  Tobacco Use  . Smoking status: Never Smoker  . Smokeless tobacco: Never Used  Substance and Sexual Activity  . Alcohol use: No  . Drug use: No  . Sexual activity: Not on file  Lifestyle  . Physical activity:    Days per week: Not on file    Minutes per session: Not on file  . Stress: Not on file  Relationships  . Social connections:    Talks on phone: Not on file    Gets together: Not on file    Attends religious service: Not on file    Active member of club or organization: Not on file    Attends meetings of clubs or organizations: Not on file    Relationship status: Not on file  . Intimate partner violence:      Fear of current or ex partner: Not on file    Emotionally abused: Not on file    Physically abused: Not on file    Forced sexual activity: Not on file  Other Topics Concern  . Not on file  Social History Narrative  . Not on file   Family History  Problem Relation Age of Onset  . Arthritis Mother   . Heart attack Father   . Asthma Sister   . Asthma Maternal Grandmother   . Heart attack Maternal Grandmother   . Heart disease Sister   . Healthy Daughter   . Healthy Daughter   . Healthy Daughter   . Healthy Daughter   . Healthy Daughter   . Thyroid cancer Neg Hx   . Colon cancer Neg Hx   . Breast cancer Neg Hx   . Prostate cancer Neg Hx   . Ovarian cancer Neg Hx     Objective: Office vital signs reviewed. BP 110/81   Pulse 66   Temp 97.8 F (36.6 C) (Oral)   Ht 5\' 3"  (1.6 m)   Wt 159 lb (72.1 kg)   BMI 28.17 kg/m   Physical Examination:  General: Awake, alert, well nourished, No acute distress Extremities: warm, well perfused, No edema, cyanosis or clubbing; +2 pulses bilaterally MSK: normal gait and station  Left shoulder: Patient has fairly preserved active range of motion except for in external rotation of the shoulder.  She has about a 5 to 10 degree loss.  She does have tenderness to palpation to the entire rotator cuff.  There are no palpable deformities or abnormalities.  She has pain with Hawkins test and with empty can.  Lumbar spine: Active range of motion is preserved.  She has no midline tenderness palpation but she has quite exquisite paraspinal muscle tenderness to palpation, particularly over the lumbosacral junction and along the SI joints bilaterally.  There are no palpable bony abnormalities in these regions. Skin: dry; intact; no rashes or lesions Neuro: 4/5 UE Strength.  UE and LE light touch sensation grossly intact  No results found.  Assessment/ Plan: 67 y.o. female   1. Degenerative disc disease, lumbar We will obtain lumbar imaging to  further evaluate lumbar spine.  She will likely need repeat MRI at some point, I believe her last one was sometime in 2008.  I placed a referral back to Raliegh Ip for further evaluation and management since her symptoms seem to be progressing beyond conservative therapies at this time.  I have gone ahead and placed her on a steroid Dosepak to see if this might help relieve  some of her discomfort.  She may continue the methocarbamol if she finds it helpful. - DG Lumbar Spine 2-3 Views; Future - Ambulatory referral to Orthopedic Surgery  2. Chronic left shoulder pain I suspect that she has rotator cuff injury given her exam.  Per her request, we have obtained an x-ray of the shoulder to look for arthritic changes.  Her exam was notable for positive Hawkins and positive empty can.  She would probably be better evaluated under ultrasound or MRI.  I have placed a referral back to her orthopedist as above.  Hopefully they can provide a little bit more insight as to what is going on with her shoulder. - DG Shoulder Left; Future - Ambulatory referral to Orthopedic Surgery   Orders Placed This Encounter  Procedures  . DG Lumbar Spine 2-3 Views    Standing Status:   Future    Number of Occurrences:   1    Standing Expiration Date:   12/30/2018    Order Specific Question:   Reason for Exam (SYMPTOM  OR DIAGNOSIS REQUIRED)    Answer:   back pain    Order Specific Question:   Preferred imaging location?    Answer:   Internal  . DG Shoulder Left    Standing Status:   Future    Number of Occurrences:   1    Standing Expiration Date:   12/31/2018    Order Specific Question:   Reason for Exam (SYMPTOM  OR DIAGNOSIS REQUIRED)    Answer:   several months left shoulder pain.    Order Specific Question:   Preferred imaging location?    Answer:   Internal    Order Specific Question:   Radiology Contrast Protocol - do NOT remove file path    Answer:    [email or web address].pdf  . Ambulatory referral to Orthopedic Surgery    Referral Priority:   Routine    Referral Type:   Surgical    Referral Reason:   Specialty Services Required    Requested Specialty:   Orthopedic Surgery    Number of Visits Requested:   1   Meds ordered this encounter  Medications  . predniSONE (STERAPRED UNI-PAK 21 TAB) 10 MG (21) TBPK tablet    Sig: As directed x 6 days    Dispense:  21 tablet    Refill:  0     Macy Lingenfelter Windell Moulding, DO [place] [phone]

## 2017-10-30 NOTE — Patient Instructions (Addendum)
I have sent in a prednisone Dosepak.  You may start this today.  You may continue to use your muscle relaxer, the methocarbamol, for low back pain while taking the prednisone if you would like.  Do not use the meloxicam while using prednisone.  Make sure that you take the prednisone with food and plenty of water.  I have placed a referral back to Raliegh Ip for your shoulder and back.  I will call you with results of your x-rays later on today. Back Pain, Adult Back pain is very common. The pain often gets better over time. The cause of back pain is usually not dangerous. Most people can learn to manage their back pain on their own. Follow these instructions at home: Watch your back pain for any changes. The following actions may help to lessen any pain you are feeling:  Stay active. Start with short walks on flat ground if you can. Try to walk farther each day.  Exercise regularly as told by your doctor. Exercise helps your back heal faster. It also helps avoid future injury by keeping your muscles strong and flexible.  Do not sit, drive, or stand in one place for more than 30 minutes.  Do not stay in bed. Resting more than 1-2 days can slow down your recovery.  Be careful when you bend or lift an object. Use good form when lifting: ? Bend at your knees. ? Keep the object close to your body. ? Do not twist.  Sleep on a firm mattress. Lie on your side, and bend your knees. If you lie on your back, put a pillow under your knees.  Take medicines only as told by your doctor.  Put ice on the injured area. ? Put ice in a plastic bag. ? Place a towel between your skin and the bag. ? Leave the ice on for 20 minutes, 2-3 times a day for the first 2-3 days. After that, you can switch between ice and heat packs.  Avoid feeling anxious or stressed. Find good ways to deal with stress, such as exercise.  Maintain a healthy weight. Extra weight puts stress on your back.  Contact a doctor  if:  You have pain that does not go away with rest or medicine.  You have worsening pain that goes down into your legs or buttocks.  You have pain that does not get better in one week.  You have pain at night.  You lose weight.  You have a fever or chills. Get help right away if:  You cannot control when you poop (bowel movement) or pee (urinate).  Your arms or legs feel weak.  Your arms or legs lose feeling (numbness).  You feel sick to your stomach (nauseous) or throw up (vomit).  You have belly (abdominal) pain.  You feel like you may pass out (faint). This information is not intended to replace advice given to you by your health care provider. Make sure you discuss any questions you have with your health care provider. Document Released: 07/11/2007 Document Revised: 06/30/2015 Document Reviewed: 05/26/2013 Elsevier Interactive Patient Education  [employer].

## 2017-12-02 ENCOUNTER — Other Ambulatory Visit: Payer: Self-pay

## 2017-12-02 MED ORDER — MELOXICAM 7.5 MG PO TABS: 7.5000 mg | ORAL_TABLET | Freq: Two times a day (BID) | ORAL | 0 refills | Status: DC | PRN

## 2017-12-04 ENCOUNTER — Ambulatory Visit (INDEPENDENT_AMBULATORY_CARE_PROVIDER_SITE_OTHER): Payer: Medicare Other

## 2017-12-04 DIAGNOSIS — Z23 Encounter for immunization: Secondary | ICD-10-CM | POA: Diagnosis not present

## 2017-12-19 DIAGNOSIS — M545 Low back pain: Secondary | ICD-10-CM | POA: Diagnosis not present

## 2017-12-26 DIAGNOSIS — M545 Low back pain: Secondary | ICD-10-CM | POA: Diagnosis not present

## 2017-12-30 DIAGNOSIS — M545 Low back pain: Secondary | ICD-10-CM | POA: Diagnosis not present

## 2018-01-13 ENCOUNTER — Telehealth: Payer: Self-pay

## 2018-01-13 ENCOUNTER — Other Ambulatory Visit: Payer: Self-pay | Admitting: Family Medicine

## 2018-01-13 MED ORDER — HYDROCHLOROTHIAZIDE 25 MG PO TABS: 25.0000 mg | ORAL_TABLET | Freq: Every day | ORAL | 3 refills | Status: DC

## 2018-01-13 MED ORDER — LOSARTAN POTASSIUM 100 MG PO TABS: 100.0000 mg | ORAL_TABLET | Freq: Every day | ORAL | 3 refills | Status: DC

## 2018-01-13 NOTE — Telephone Encounter (Signed)
Yes, will send in separately

## 2018-01-13 NOTE — Telephone Encounter (Signed)
Losartan HCTZ on back order   Can you split in two RX's?

## 2018-01-14 ENCOUNTER — Telehealth: Payer: Self-pay | Admitting: *Deleted

## 2018-01-14 NOTE — Telephone Encounter (Signed)
Fax from [facility] 100-25 mg is on backorder If appropriate, please send in new Rx for separate Losartan 100 mg QD & HCTZ 25 mg QD

## 2018-01-14 NOTE — Telephone Encounter (Signed)
This was already done yesterday 

## 2018-03-17 ENCOUNTER — Encounter: Payer: Self-pay | Admitting: Physician Assistant

## 2018-03-17 ENCOUNTER — Ambulatory Visit (INDEPENDENT_AMBULATORY_CARE_PROVIDER_SITE_OTHER): Payer: Medicare Other | Admitting: Physician Assistant

## 2018-03-17 ENCOUNTER — Other Ambulatory Visit: Payer: Self-pay | Admitting: Family Medicine

## 2018-03-17 VITALS — BP 109/78 | HR 91 | Temp 98.0°F | Ht 63.0 in | Wt 156.8 lb

## 2018-03-17 DIAGNOSIS — J0110 Acute frontal sinusitis, unspecified: Secondary | ICD-10-CM | POA: Diagnosis not present

## 2018-03-17 DIAGNOSIS — Z1211 Encounter for screening for malignant neoplasm of colon: Secondary | ICD-10-CM

## 2018-03-17 DIAGNOSIS — R059 Cough, unspecified: Secondary | ICD-10-CM

## 2018-03-17 DIAGNOSIS — R05 Cough: Secondary | ICD-10-CM

## 2018-03-17 DIAGNOSIS — R42 Dizziness and giddiness: Secondary | ICD-10-CM | POA: Diagnosis not present

## 2018-03-17 DIAGNOSIS — M791 Myalgia, unspecified site: Secondary | ICD-10-CM | POA: Diagnosis not present

## 2018-03-17 DIAGNOSIS — J111 Influenza due to unidentified influenza virus with other respiratory manifestations: Secondary | ICD-10-CM

## 2018-03-17 LAB — VERITOR FLU A/B WAIVED
INFLUENZA B: NEGATIVE
Influenza A: POSITIVE — AB

## 2018-03-17 MED ORDER — MECLIZINE HCL 25 MG PO TABS: 25.0000 mg | ORAL_TABLET | Freq: Three times a day (TID) | ORAL | 0 refills | Status: DC | PRN

## 2018-03-17 MED ORDER — AMOXICILLIN 500 MG PO CAPS: 500.0000 mg | ORAL_CAPSULE | Freq: Three times a day (TID) | ORAL | 0 refills | Status: DC

## 2018-03-17 NOTE — Progress Notes (Addendum)
BP 109/78   Pulse 91   Temp 98 F (36.7 C) (Oral)   Ht 5\' 3"  (1.6 m)   Wt 156 lb 12.8 oz (71.1 kg)   BMI 27.78 kg/m    Subjective:    Patient ID: Diane Grant, female    DOB: 1950-12-16, 68 y.o.   MRN: [id number]  HPI: Diane Grant is a 68 y.o. female presenting on 03/17/2018 for Chills; Urticaria; Cough; Generalized Body Aches; and Dizziness  This patient has had many days of sore throat and postnasal drainage, headache at times and sinus pressure. There is copious drainage at times. Denies any fever at this time. There has been a history of sinus infections in the past.  There is cough at night. It has become more prevalent in recent days.  Also with vertigo symptoms, dizziness when turning and standing. Has history of vertigo and had old bottles of meclizine. Has nausea without vomiting.  Past Medical History:  Diagnosis Date  . Degenerative disc disease, lumbar   . Hypertension   . Thyroid disease    hypothyroidism   Relevant past medical, surgical, family and social history reviewed and updated as indicated. Interim medical history since our last visit reviewed. Allergies and medications reviewed and updated. DATA REVIEWED: CHART IN EPIC  Family History reviewed for pertinent findings.  Review of Systems  Constitutional: Positive for chills and fatigue. Negative for activity change, appetite change and fever.  HENT: Positive for congestion, postnasal drip, sinus pressure, sinus pain and sore throat.   Eyes: Negative.   Respiratory: Positive for cough. Negative for shortness of breath and wheezing.   Cardiovascular: Negative.  Negative for chest pain, palpitations and leg swelling.  Gastrointestinal: Negative.   Genitourinary: Negative.   Musculoskeletal: Negative.   Skin: Negative.   Neurological: Positive for dizziness and headaches.    Allergies as of 03/17/2018      Reactions   Anesthesia S-i-40 [propofol]    Per patient need to be careful when  giving this      Medication List       Accurate as of March 17, 2018  9:52 AM. Always use your most recent med list.        albuterol 108 (90 Base) MCG/ACT inhaler Commonly known as:  PROVENTIL HFA;VENTOLIN HFA Inhale into the lungs every 4 (four) hours as needed for wheezing or shortness of breath.   amoxicillin 500 MG capsule Commonly known as:  AMOXIL Take 1 capsule (500 mg total) by mouth 3 (three) times daily.   hydrochlorothiazide 25 MG tablet Commonly known as:  HYDRODIURIL Take 1 tablet (25 mg total) by mouth daily.   levothyroxine 75 MCG tablet Commonly known as:  SYNTHROID, LEVOTHROID Take 1 tablet (75 mcg total) by mouth daily before breakfast.   losartan 100 MG tablet Commonly known as:  COZAAR Take 1 tablet (100 mg total) by mouth daily.   meclizine 25 MG tablet Commonly known as:  ANTIVERT Take 1 tablet (25 mg total) by mouth 3 (three) times daily as needed for dizziness.   meloxicam 7.5 MG tablet Commonly known as:  MOBIC Take 1 tablet (7.5 mg total) by mouth 2 (two) times daily as needed for pain.   methocarbamol 500 MG tablet Commonly known as:  ROBAXIN Take 1 tablet (500 mg total) by mouth 4 (four) times daily as needed for muscle spasms.          Objective:    BP 109/78   Pulse 91  Temp 98 F (36.7 C) (Oral)   Ht 5\' 3"  (1.6 m)   Wt 156 lb 12.8 oz (71.1 kg)   BMI 27.78 kg/m   Allergies  Allergen Reactions  . Anesthesia S-I-40 [Propofol]     Per patient need to be careful when giving this    Wt Readings from Last 3 Encounters:  03/17/18 156 lb 12.8 oz (71.1 kg)  10/30/17 159 lb (72.1 kg)  03/08/17 162 lb (73.5 kg)    Physical Exam Constitutional:      Appearance: She is well-developed.  HENT:     Head: Normocephalic and atraumatic.     Right Ear: Tympanic membrane and external ear normal. No middle ear effusion.     Left Ear: Tympanic membrane and external ear normal.  No middle ear effusion.     Nose: Mucosal edema and  rhinorrhea present.     Right Sinus: No maxillary sinus tenderness.     Left Sinus: No maxillary sinus tenderness.     Mouth/Throat:     Pharynx: Uvula midline. Posterior oropharyngeal erythema present.  Eyes:     General:        Right eye: No discharge.        Left eye: No discharge.     Conjunctiva/sclera: Conjunctivae normal.     Pupils: Pupils are equal, round, and reactive to light.  Neck:     Musculoskeletal: Normal range of motion.  Cardiovascular:     Rate and Rhythm: Normal rate and regular rhythm.     Heart sounds: Normal heart sounds.  Pulmonary:     Effort: Pulmonary effort is normal. No respiratory distress.     Breath sounds: Normal breath sounds. No wheezing.  Abdominal:     Palpations: Abdomen is soft.  Lymphadenopathy:     Cervical: No cervical adenopathy.  Skin:    General: Skin is warm and dry.  Neurological:     Mental Status: She is alert and oriented to person, place, and time.     Results for orders placed or performed in visit on 03/08/17  TSH  Result Value Ref Range   TSH 4.160 0.450 - 4.500 uIU/mL      Assessment & Plan:   1. Cough - Veritor Flu A/B Waived  2. Myalgia - Veritor Flu A/B Waived  3. Vertigo - meclizine (ANTIVERT) 25 MG tablet; Take 1 tablet (25 mg total) by mouth 3 (three) times daily as needed for dizziness.  Dispense: 30 tablet; Refill: 0  4. Acute non-recurrent frontal sinusitis - amoxicillin (AMOXIL) 500 MG capsule; Take 1 capsule (500 mg total) by mouth 3 (three) times daily.  Dispense: 30 capsule; Refill: 0  5. Influenza supportive care Past window of tamiflu treatment   Continue all other maintenance medications as listed above.  Follow up plan: No follow-ups on file.  Educational handout given for [place] PA-C [place] [address]  [place], [place] [address] [phone]   03/17/2018, 9:53 AM

## 2018-03-17 NOTE — Patient Instructions (Addendum)
In a few days you may receive a survey in the mail or online from [employer] regarding your visit with us today. Please take a moment to fill this out. Your feedback is very important to our whole office. It can help us better understand your needs as well as improve your experience and satisfaction. Thank you for taking your time to complete it. We care about you.  Maire Govan, PA-C  

## 2018-03-18 ENCOUNTER — Telehealth: Payer: Self-pay | Admitting: Family Medicine

## 2018-03-18 ENCOUNTER — Other Ambulatory Visit: Payer: Self-pay | Admitting: Physician Assistant

## 2018-03-18 MED ORDER — HYDROXYZINE HCL 10 MG PO TABS: 10.0000 mg | ORAL_TABLET | Freq: Three times a day (TID) | ORAL | 0 refills | Status: DC | PRN

## 2018-03-18 NOTE — Telephone Encounter (Signed)
Patient seen [facility] yesterday - please advise

## 2018-03-18 NOTE — Telephone Encounter (Signed)
sent 

## 2018-03-18 NOTE — Telephone Encounter (Signed)
Is she able to take prednisone?

## 2018-03-18 NOTE — Telephone Encounter (Signed)
Patient states she does not remember ever taking prednisone. States she has been on hydroxyzine and it helps with her hives.

## 2018-03-18 NOTE — Telephone Encounter (Signed)
Patient aware and verbalizes understanding. 

## 2018-04-07 ENCOUNTER — Encounter: Payer: Self-pay | Admitting: Nurse Practitioner

## 2018-04-07 ENCOUNTER — Ambulatory Visit (INDEPENDENT_AMBULATORY_CARE_PROVIDER_SITE_OTHER): Payer: Medicare Other | Admitting: Nurse Practitioner

## 2018-04-07 VITALS — BP 115/71 | HR 74 | Temp 97.2°F | Ht 63.0 in | Wt 158.0 lb

## 2018-04-07 DIAGNOSIS — R0602 Shortness of breath: Secondary | ICD-10-CM | POA: Diagnosis not present

## 2018-04-07 MED ORDER — AZITHROMYCIN 250 MG PO TABS: ORAL_TABLET | ORAL | 0 refills | Status: DC

## 2018-04-07 NOTE — Patient Instructions (Signed)

## 2018-04-07 NOTE — Addendum Note (Signed)
Addended by: Chevis Pretty on: 04/07/2018 06:11 PM   Modules accepted: Orders

## 2018-04-07 NOTE — Progress Notes (Signed)
   Subjective:    Patient ID: Diane Grant, female    DOB: 07-01-1950, 68 y.o.   MRN: [id number]   Chief Complaint: Chills (slight cough, wheezing ) and Hoarse   HPI Patient comes in c/o chills every since she was diagnosed with flu on 03/17/18.she says she is having SOB and feels like hard to take a deep breath.    Review of Systems  Constitutional: Positive for chills and fatigue. Negative for fever.  HENT: Positive for congestion. Negative for sinus pain and sore throat.   Respiratory: Negative for cough.   Cardiovascular: Negative.   Genitourinary: Negative.   Musculoskeletal: Negative for myalgias.  Neurological: Positive for dizziness. Negative for headaches.  Psychiatric/Behavioral: Negative.   All other systems reviewed and are negative.      Objective:   Physical Exam Constitutional:      General: She is in acute distress (mild).     Appearance: She is normal weight.  HENT:     Right Ear: Tympanic membrane, ear canal and external ear normal.     Left Ear: Tympanic membrane, ear canal and external ear normal.     Nose: Nose normal.  Neurological:     Mental Status: She is alert.    BP 115/71 (BP Location: Right Arm)   Pulse 74   Temp (!) 97.2 F (36.2 C) (Oral)   Ht 5\' 3"  (1.6 m)   Wt 158 lb (71.7 kg)   SpO2 100%   BMI 27.99 kg/m   ekg- nsr      Assessment & Plan:  LAURYN LIZARDI in today with chief complaint of Chills (slight cough, wheezing ) and Hoarse   1. SOB (shortness of breath) 1. Take meds as prescribed 2. Use a cool mist humidifier especially during the winter months and when heat has been humid. 3. Use saline nose sprays frequently 4. Saline irrigations of the nose can be very helpful if done frequently.  * 4X daily for 1 week*  * Use of a nettie pot can be helpful with this. Follow directions with this* 5. Drink plenty of fluids 6. Keep thermostat turn down low 7.For any cough or congestion  Use plain Mucinex- regular  strength or max strength is fine   * Children- consult with Pharmacist for dosing 8. For fever or aces or pains- take tylenol or ibuprofen appropriate for age and weight.  * for fevers greater than 101 orally you may alternate ibuprofen and tylenol every  3 hours.    - EKG 12-Lead - azithromycin (ZITHROMAX Z-PAK) 250 MG tablet; As directed  Dispense: 6 tablet; Refill: 0  Mary-Margaret Hassell Done, FNP

## 2018-04-09 ENCOUNTER — Telehealth: Payer: Self-pay | Admitting: Family Medicine

## 2018-04-09 DIAGNOSIS — D709 Neutropenia, unspecified: Secondary | ICD-10-CM

## 2018-04-09 LAB — CMP14+EGFR
ALT: 17 IU/L (ref 0–32)
AST: 27 IU/L (ref 0–40)
Albumin/Globulin Ratio: 1.7 (ref 1.2–2.2)
Albumin: 4.5 g/dL (ref 3.8–4.8)
Alkaline Phosphatase: 80 IU/L (ref 39–117)
BUN/Creatinine Ratio: 14 (ref 12–28)
BUN: 10 mg/dL (ref 8–27)
Bilirubin Total: 0.2 mg/dL (ref 0.0–1.2)
CALCIUM: 9.8 mg/dL (ref 8.7–10.3)
CO2: 23 mmol/L (ref 20–29)
Chloride: 101 mmol/L (ref 96–106)
Creatinine, Ser: 0.69 mg/dL (ref 0.57–1.00)
GFR calc Af Amer: 104 mL/min/{1.73_m2} (ref >59)
GFR calc non Af Amer: 90 mL/min/{1.73_m2} (ref >59)
Globulin, Total: 2.7 g/dL (ref 1.5–4.5)
Glucose: 83 mg/dL (ref 65–99)
Potassium: 4.5 mmol/L (ref 3.5–5.2)
Sodium: 140 mmol/L (ref 134–144)
Total Protein: 7.2 g/dL (ref 6.0–8.5)

## 2018-04-09 LAB — CBC WITH DIFFERENTIAL/PLATELET
Basophils Absolute: 0.1 10*3/uL (ref 0.0–0.2)
Basos: 1 %
EOS (ABSOLUTE): 0.1 10*3/uL (ref 0.0–0.4)
Eos: 2 %
Hematocrit: 42 % (ref 34.0–46.6)
Hemoglobin: 14.9 g/dL (ref 11.1–15.9)
Lymphocytes Absolute: 4.1 10*3/uL — ABNORMAL HIGH (ref 0.7–3.1)
Lymphs: 78 %
MCH: 30.2 pg (ref 26.6–33.0)
MCHC: 35.5 g/dL (ref 31.5–35.7)
MCV: 85 fL (ref 79–97)
MONOS ABS: 0.9 10*3/uL (ref 0.1–0.9)
Monocytes: 17 %
NEUTROS PCT: 2 %
NRBC: 2 % — ABNORMAL HIGH (ref 0–0)
Neutrophils Absolute: 0.1 10*3/uL — CL (ref 1.4–7.0)
Platelets: 284 10*3/uL (ref 150–450)
RBC: 4.94 x10E6/uL (ref 3.77–5.28)
RDW: 12.7 % (ref 11.7–15.4)
WBC: 5.3 10*3/uL (ref 3.4–10.8)

## 2018-04-09 NOTE — Telephone Encounter (Signed)
Pt aware and cbc order placed

## 2018-04-09 NOTE — Addendum Note (Signed)
Addended byCarrolyn Leigh on: 04/09/2018 09:59 AM   Modules accepted: Orders

## 2018-04-09 NOTE — Telephone Encounter (Signed)
Patient seen by MM yesterday.  Labs obtained which showed neutropenia.  WBC was overall normal.  I would like to repeat her CBC w/ diff in a few days.  If persistently abnormal, recommend referral to Hematology for further evaluation.  Diane Grant M. Lajuana Ripple, [facility]

## 2018-04-11 ENCOUNTER — Other Ambulatory Visit: Payer: Medicare Other

## 2018-04-11 DIAGNOSIS — D709 Neutropenia, unspecified: Secondary | ICD-10-CM | POA: Diagnosis not present

## 2018-04-12 LAB — CBC WITH DIFFERENTIAL/PLATELET
Basophils Absolute: 0 10*3/uL (ref 0.0–0.2)
Basos: 0 %
EOS (ABSOLUTE): 0.2 10*3/uL (ref 0.0–0.4)
EOS: 2 %
HEMATOCRIT: 41.3 % (ref 34.0–46.6)
Hemoglobin: 14.4 g/dL (ref 11.1–15.9)
Immature Grans (Abs): 0 10*3/uL (ref 0.0–0.1)
Immature Granulocytes: 0 %
Lymphocytes Absolute: 2.7 10*3/uL (ref 0.7–3.1)
Lymphs: 28 %
MCH: 29.8 pg (ref 26.6–33.0)
MCHC: 34.9 g/dL (ref 31.5–35.7)
MCV: 85 fL (ref 79–97)
Monocytes Absolute: 0.7 10*3/uL (ref 0.1–0.9)
Monocytes: 7 %
Neutrophils Absolute: 5.9 10*3/uL (ref 1.4–7.0)
Neutrophils: 63 %
Platelets: 298 10*3/uL (ref 150–450)
RBC: 4.84 x10E6/uL (ref 3.77–5.28)
RDW: 12.7 % (ref 11.7–15.4)
WBC: 9.5 10*3/uL (ref 3.4–10.8)

## 2018-04-13 ENCOUNTER — Other Ambulatory Visit: Payer: Self-pay | Admitting: Family Medicine

## 2018-04-14 NOTE — Telephone Encounter (Signed)
Please make sure patient has appt scheduled for full physical with fasting labs. Synthroid sent to pharmacy

## 2018-04-14 NOTE — Telephone Encounter (Signed)
Last thyroid 2/19

## 2018-04-30 ENCOUNTER — Ambulatory Visit: Payer: Medicare Other | Admitting: *Deleted

## 2018-05-24 ENCOUNTER — Telehealth: Payer: Self-pay | Admitting: Family Medicine

## 2018-05-24 DIAGNOSIS — J9801 Acute bronchospasm: Secondary | ICD-10-CM | POA: Diagnosis not present

## 2018-05-24 DIAGNOSIS — J9809 Other diseases of bronchus, not elsewhere classified: Secondary | ICD-10-CM

## 2018-05-24 MED ORDER — ALBUTEROL SULFATE HFA 108 (90 BASE) MCG/ACT IN AERS: 2.0000 | INHALATION_SPRAY | Freq: Four times a day (QID) | RESPIRATORY_TRACT | 0 refills | Status: DC | PRN

## 2018-05-24 MED ORDER — BUDESONIDE-FORMOTEROL FUMARATE 80-4.5 MCG/ACT IN AERO: 2.0000 | INHALATION_SPRAY | Freq: Two times a day (BID) | RESPIRATORY_TRACT | 2 refills | Status: DC

## 2018-05-24 NOTE — Telephone Encounter (Signed)
Telephone visit  Subjective: CC: shortness of breath PCP: [name], DO [id number] Diane Grant is a 68 y.o. female calls for telephone consult today. Patient provides verbal consent for consult held via phone.  Location of patient: home Location of provider: [facility] Others present for call: none  1. Shortness of breath Patient reports a several week history of intermittent shortness of breath that seems to be triggered by pollen.  She reports some dyspnea on exertion when these episodes occur.  Denies any hemoptysis, fevers.  She reports that shortness of breath is relieved by the albuterol that was prescribed last month but she has since run out of it.  She is using it pretty regularly during the pollen season.  She is also using a daily antihistamine but does not find this especially helpful.  She was treated with antibiotics x2 over the last couple of months.  ROS: Per HPI  Allergies  Allergen Reactions  . Anesthesia S-I-40 [Propofol]     Per patient need to be careful when giving this   Past Medical History:  Diagnosis Date  . Degenerative disc disease, lumbar   . Hypertension   . Thyroid disease    hypothyroidism    Current Outpatient Medications:  .  albuterol (VENTOLIN HFA) 108 (90 Base) MCG/ACT inhaler, Inhale 2 puffs into the lungs every 6 (six) hours as needed for wheezing or shortness of breath., Disp: 1 Inhaler, Rfl: 0 .  budesonide-formoterol (SYMBICORT) 80-4.5 MCG/ACT inhaler, Inhale 2 puffs into the lungs 2 (two) times a day. (rinse mouth after use), Disp: 1 Inhaler, Rfl: 2 .  hydrochlorothiazide (HYDRODIURIL) 25 MG tablet, Take 1 tablet (25 mg total) by mouth daily., Disp: 90 tablet, Rfl: 3 .  hydrOXYzine (ATARAX/VISTARIL) 10 MG tablet, Take 1 tablet (10 mg total) by mouth 3 (three) times daily as needed for itching., Disp: 40 tablet, Rfl: 0 .  levothyroxine (SYNTHROID, LEVOTHROID) 75 MCG tablet, TAKE 1 TABLET BY MOUTH ONCE DAILY BEFORE BREAKFAST, Disp: 90  tablet, Rfl: 0 .  losartan (COZAAR) 100 MG tablet, Take 1 tablet (100 mg total) by mouth daily., Disp: 90 tablet, Rfl: 3 .  meclizine (ANTIVERT) 25 MG tablet, Take 1 tablet (25 mg total) by mouth 3 (three) times daily as needed for dizziness. (Patient not taking: Reported on 04/07/2018), Disp: 30 tablet, Rfl: 0 .  meloxicam (MOBIC) 7.5 MG tablet, Take 1 tablet (7.5 mg total) by mouth 2 (two) times daily as needed for pain. (Patient not taking: Reported on 04/07/2018), Disp: 180 tablet, Rfl: 0 .  methocarbamol (ROBAXIN) 500 MG tablet, Take 1 tablet (500 mg total) by mouth 4 (four) times daily as needed for muscle spasms. (Patient not taking: Reported on 04/07/2018), Disp: 40 tablet, Rfl: 0  Assessment/ Plan: 68 y.o. female   1. Bronchospastic airway disease Likely triggered by allergies.  At this time, she is not reporting any red flag symptoms or signs but low threshold to perform x-ray and pulmonary function testing if ongoing symptoms despite use of daily inhaled corticosteroid.  I discussed with her to rinse her mouth out after each use.  I have also sent a renewal of the rescue inhaler to the pharmacy.  If she has persistent symptoms I have advised her to seek reevaluation.  She voiced good understanding will follow-up PRN - budesonide-formoterol (SYMBICORT) 80-4.5 MCG/ACT inhaler; Inhale 2 puffs into the lungs 2 (two) times a day. (rinse mouth after use)  Dispense: 1 Inhaler; Refill: 2 - albuterol (VENTOLIN HFA) 108 (90 Base)  MCG/ACT inhaler; Inhale 2 puffs into the lungs every 6 (six) hours as needed for wheezing or shortness of breath.  Dispense: 1 Inhaler; Refill: 0   Start time: 3:18pm End time: 3:25pm  Total time spent on patient care (including telephone call/ virtual visit): 15 minutes  [place], [place] [phone]

## 2018-05-26 ENCOUNTER — Telehealth: Payer: Self-pay | Admitting: *Deleted

## 2018-05-26 NOTE — Telephone Encounter (Signed)
Patient states she is concerned about using Symbicort because she read on the package insert that it is for asthma and COPD, and she has never been diagnosed with these.  Advised her that according to Dr. Marjean Donna note from 05/23/2018 that pollen was causing asthma like symptoms and that the Symbicort and Albuterol would help with these.  Patient sounded short of breath on the phone.  Advised start inhalers today - 2 puffs of the symbicort twice daily and 2 puffs of albuterol in between if needed.  Advised her if she felt her shortness of breath was worsening or she felt it was an emergency she should call 911 - patient agreeable.

## 2018-05-26 NOTE — Telephone Encounter (Signed)
Agreed -

## 2018-05-28 ENCOUNTER — Ambulatory Visit
Admission: RE | Admit: 2018-05-28 | Discharge: 2018-05-28 | Disposition: A | Discharge status: Home / Self Care | Payer: Medicare Other | Source: Ambulatory Visit | Attending: Family Medicine | Admitting: Family Medicine

## 2018-05-28 ENCOUNTER — Other Ambulatory Visit: Payer: Self-pay

## 2018-05-28 ENCOUNTER — Telehealth: Payer: Self-pay | Admitting: Family Medicine

## 2018-05-28 ENCOUNTER — Ambulatory Visit (INDEPENDENT_AMBULATORY_CARE_PROVIDER_SITE_OTHER): Payer: Medicare Other | Admitting: Family Medicine

## 2018-05-28 DIAGNOSIS — R0789 Other chest pain: Secondary | ICD-10-CM | POA: Diagnosis not present

## 2018-05-28 DIAGNOSIS — R0609 Other forms of dyspnea: Secondary | ICD-10-CM

## 2018-05-28 DIAGNOSIS — R002 Palpitations: Secondary | ICD-10-CM | POA: Diagnosis not present

## 2018-05-28 DIAGNOSIS — R079 Chest pain, unspecified: Secondary | ICD-10-CM | POA: Diagnosis not present

## 2018-05-28 NOTE — Telephone Encounter (Signed)
Please advise on CXR question.

## 2018-05-28 NOTE — Progress Notes (Addendum)
Telephone visit  Subjective: CC: dyspnea on exertion PCP: [name], DO Diane Grant is a 68 y.o. female calls for telephone consult today. Patient provides verbal consent for consult held via phone.  Location of patient: home Location of provider: [facility] Others present for call: husband  1. Dyspnea on exertion Patient reports ongoing dyspnea with exertion.  She notes that she has shortness of breath with things like going to the mailbox.  Symptoms started after she was diagnosed with a flulike illness a few months ago.  She has been seen twice in office for cough and shortness of breath.  She had an EKG that showed some flattening in V2 and V3 of T waves in March.  She now reports some left-sided chest pressure during dyspneic spells.  She "overall does not feel right."  During her last visit 05/24/2018, she thought shortness of breath was triggered by pollen.  She endorses no infectious symptoms and notes that the shortness of breath was relieved by albuterol but had run out.  She was prescribed Symbicort and albuterol but states that she has not had a great deal of change in her breathing.  She reports heart palpitations during episodes   ROS: Per HPI  Allergies  Allergen Reactions  . Anesthesia S-I-40 [Propofol]     Per patient need to be careful when giving this   Past Medical History:  Diagnosis Date  . Degenerative disc disease, lumbar   . Hypertension   . Thyroid disease    hypothyroidism    Current Outpatient Medications:  .  albuterol (VENTOLIN HFA) 108 (90 Base) MCG/ACT inhaler, Inhale 2 puffs into the lungs every 6 (six) hours as needed for wheezing or shortness of breath., Disp: 1 Inhaler, Rfl: 0 .  budesonide-formoterol (SYMBICORT) 80-4.5 MCG/ACT inhaler, Inhale 2 puffs into the lungs 2 (two) times a day. (rinse mouth after use), Disp: 1 Inhaler, Rfl: 2 .  hydrochlorothiazide (HYDRODIURIL) 25 MG tablet, Take 1 tablet (25 mg total) by mouth daily., Disp:  90 tablet, Rfl: 3 .  hydrOXYzine (ATARAX/VISTARIL) 10 MG tablet, Take 1 tablet (10 mg total) by mouth 3 (three) times daily as needed for itching., Disp: 40 tablet, Rfl: 0 .  levothyroxine (SYNTHROID, LEVOTHROID) 75 MCG tablet, TAKE 1 TABLET BY MOUTH ONCE DAILY BEFORE BREAKFAST, Disp: 90 tablet, Rfl: 0 .  losartan (COZAAR) 100 MG tablet, Take 1 tablet (100 mg total) by mouth daily., Disp: 90 tablet, Rfl: 3 .  meclizine (ANTIVERT) 25 MG tablet, Take 1 tablet (25 mg total) by mouth 3 (three) times daily as needed for dizziness. (Patient not taking: Reported on 04/07/2018), Disp: 30 tablet, Rfl: 0 .  meloxicam (MOBIC) 7.5 MG tablet, Take 1 tablet (7.5 mg total) by mouth 2 (two) times daily as needed for pain. (Patient not taking: Reported on 04/07/2018), Disp: 180 tablet, Rfl: 0 .  methocarbamol (ROBAXIN) 500 MG tablet, Take 1 tablet (500 mg total) by mouth 4 (four) times daily as needed for muscle spasms. (Patient not taking: Reported on 04/07/2018), Disp: 40 tablet, Rfl: 0  Assessment/ Plan: 68 y.o. female   1. Dyspnea on exertion I reviewed her March 2020 EKG which upon personal review showed flattening of T waves in leads V2 and V3 I have recommended that she consider being seen in the emergency department to rule out ischemic changes.  She did not seem amenable to this at this time.  She would be agreeable to seeing a cardiologist and I placed a referral for evaluation  and further work-up.  We discussed that if symptoms were to worsen for any reason that she is to seek immediate medical attention emergency department as this could be life-threatening.  She was good understanding. - Ambulatory referral to Cardiology  2. Atypical chest pain - Ambulatory referral to Cardiology  3. Heart palpitations - Ambulatory referral to Cardiology  Orders Placed This Encounter  Procedures  . DG Chest 2 View  . Ambulatory referral to Cardiology    Start time: 8:08a End time: 8:13a  Total time spent on  patient care (including telephone call/ virtual visit): 15 minutes  [place], [place] [phone]

## 2018-05-28 NOTE — Telephone Encounter (Signed)
Pt instructed to go to GI and call from her car for registration and further instructions; Contacted [facility]) to let them know she was on her way.

## 2018-05-28 NOTE — Addendum Note (Signed)
Addended by: [name] on: 05/28/2018 11:12 AM   Modules accepted: Orders

## 2018-05-28 NOTE — Telephone Encounter (Signed)
I can order this and Diane Grant will let her know where she can go.

## 2018-05-28 NOTE — Telephone Encounter (Signed)
Please review

## 2018-06-02 ENCOUNTER — Ambulatory Visit (INDEPENDENT_AMBULATORY_CARE_PROVIDER_SITE_OTHER): Payer: Medicare Other | Admitting: *Deleted

## 2018-06-02 ENCOUNTER — Other Ambulatory Visit: Payer: Self-pay

## 2018-06-02 DIAGNOSIS — Z0000 Encounter for general adult medical examination without abnormal findings: Secondary | ICD-10-CM

## 2018-06-02 DIAGNOSIS — Z1211 Encounter for screening for malignant neoplasm of colon: Secondary | ICD-10-CM

## 2018-06-02 NOTE — Progress Notes (Addendum)
MEDICARE ANNUAL WELLNESS VISIT  06/02/2018  Telephone Visit Disclaimer This Medicare AWV was conducted by telephone due to national recommendations for restrictions regarding the COVID-19 Pandemic (e.g. social distancing).  I verified, using two identifiers, that I am speaking with Diane Grant or their authorized healthcare agent. I discussed the limitations, risks, security, and privacy concerns of performing an evaluation and management service by telephone and the potential availability of an in-person appointment in the future. The patient expressed understanding and agreed to proceed.   Subjective:   Diane Grant is a 68 y.o. female patient of [name], DO who had a Medicare Annual Wellness Visit today via telephone. She lives at home with her husband Jeneen Rinks and they have 5 adult daughters. 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  She enjoys singing in the choir at her church. She does not have any pets in the home. Diane Grant states that she is still really sob, and experiencing flutters. Patient states Dr. Lajuana Ripple placed referral for cardiology on 04/22. Patient has been checking bp daily since 4/22.   BP readings 4/22 136/76  p 75 4/23  98/68 P 62 4/24 102/50 P 69  4/25 100/61 P 74 4/26 91/61 P 61 4/27 96/67 P 65    Patient Care Team: [name], DO as PCP - General ([place])  Hospital Utilization Over the Past 12 Months: # of hospitalizations or ER visits: 0 # of surgeries: 0  Review of Systems    Patient reports that her overall health is worse compared to last year.  Review of Systems Positive For: Respiratory ROS: positive for - shortness of breath Cardiovascular ROS: positive for - Flutter  Muscle cramps in left leg.   All other systems negative.      Current Medications (verified) Allergies as of 06/02/2018      Reactions   Anesthesia S-i-40 [propofol]    Per patient need to be  careful when giving this      Medication List       Accurate as of June 02, 2018  9:26 AM. Always use your most recent med list.        albuterol 108 (90 Base) MCG/ACT inhaler Commonly known as:  VENTOLIN HFA Inhale 2 puffs into the lungs every 6 (six) hours as needed for wheezing or shortness of breath.   budesonide-formoterol 80-4.5 MCG/ACT inhaler Commonly known as:  Symbicort Inhale 2 puffs into the lungs 2 (two) times a day. (rinse mouth after use)   hydrochlorothiazide 25 MG tablet Commonly known as:  HYDRODIURIL Take 1 tablet (25 mg total) by mouth daily.   hydrOXYzine 10 MG tablet Commonly known as:  ATARAX/VISTARIL Take 1 tablet (10 mg total) by mouth 3 (three) times daily as needed for itching.   levothyroxine 75 MCG tablet Commonly known as:  SYNTHROID TAKE 1 TABLET BY MOUTH ONCE DAILY BEFORE BREAKFAST   losartan 100 MG tablet Commonly known as:  COZAAR Take 1 tablet (100 mg total) by mouth daily.   meclizine 25 MG tablet Commonly known as:  ANTIVERT Take 1 tablet (25 mg total) by mouth 3 (three) times daily as needed for dizziness.   meloxicam 7.5 MG tablet Commonly known as:  MOBIC Take 1 tablet (7.5 mg total) by mouth 2 (two) times daily as needed for pain.   methocarbamol 500 MG tablet Commonly known as:  ROBAXIN Take 1 tablet (500 mg total) by mouth 4 (  four) times daily as needed for muscle spasms.   omeprazole 20 MG capsule Commonly known as:  PRILOSEC Take 20 mg by mouth as needed.       Allergies (verified) Anesthesia s-i-40 [propofol]   History (reviewed): Past Medical History:  Diagnosis Date  . Degenerative disc disease, lumbar   . Hypertension   . Thyroid disease    hypothyroidism   Past Surgical History:  Procedure Laterality Date  . ABDOMINAL HYSTERECTOMY    . ROTATOR CUFF REPAIR  11/30/2015   Right shoulder   Family History  Problem Relation Age of Onset  . Arthritis Mother   . Heart attack Father   . Heart disease  Father   . Asthma Sister   . Heart disease Brother   . Asthma Maternal Grandmother   . Heart attack Maternal Grandmother   . Heart disease Sister   . Cancer Brother        liver  . Healthy Daughter   . Healthy Daughter   . Healthy Daughter   . Healthy Daughter   . Healthy Daughter   . Thyroid cancer Neg Hx   . Colon cancer Neg Hx   . Breast cancer Neg Hx   . Prostate cancer Neg Hx   . Ovarian cancer Neg Hx    Social History   Socioeconomic History  . Marital status: Married    Spouse name: Jeneen Rinks  . Number of children: 5  . Years of education: 20  . Highest education level: High school graduate  Occupational History  . Not on file  Social Needs  . Financial resource strain: Not hard at all  . Food insecurity:    Worry: Never true    Inability: Never true  . Transportation needs:    Medical: No    Non-medical: No  Tobacco Use  . Smoking status: Never Smoker  . Smokeless tobacco: Never Used  Substance and Sexual Activity  . Alcohol use: No  . Drug use: No  . Sexual activity: Not on file  Lifestyle  . Physical activity:    Days per week: 3 days    Minutes per session: 30 min  . Stress: Not at all  Relationships  . Social connections:    Talks on phone: More than three times a week    Gets together: More than three times a week    Attends religious service: More than 4 times per year    Active member of club or organization: Yes    Attends meetings of clubs or organizations: More than 4 times per year    Relationship status: Married  Other Topics Concern  . Not on file  Social History Narrative  . Not on file    Activities of Daily Living In your present state of health, do you have any difficulty performing the following activities: 06/02/2018  Hearing? Y  Comment Patient feels like there is a decrease in left ear  Vision? N  Comment Reading glasses   Difficulty concentrating or making decisions? N  Walking or climbing stairs? N  Dressing or bathing?  N  Doing errands, shopping? N  Preparing Food and eating ? N  Using the Toilet? N  In the past six months, have you accidently leaked urine? Y  Comment Patient states she does have urine leakage at times   Do you have problems with loss of bowel control? N  Managing your Medications? N  Managing your Finances? N  Housekeeping or managing your Housekeeping? N  Some recent data might be hidden     Exercise Current Exercise Habits: Home exercise routine, Type of exercise: walking, Time (Minutes): 30, Frequency (Times/Week): 4, Weekly Exercise (Minutes/Week): 120, Intensity: Mild, Exercise limited by: respiratory conditions(s)  Diet Patient reports consuming 2 meals a day and 2 snacks a day Patient reports that primary diet is: Low Sodium  Depression Screen PHQ 2/9 Scores 06/02/2018 04/07/2018 03/17/2018 10/30/2017 03/08/2017  PHQ - 2 Score 0 0 0 0 0     Fall Risk Fall Risk  06/02/2018 04/07/2018 03/17/2018 10/30/2017 03/08/2017  Falls in the past year? 0 0 0 No No     Objective:      Last 3 BP Readings Last Weight Last BMI  BP Readings from Last 3 Encounters:  04/07/18 115/71  03/17/18 109/78  10/30/17 110/81    Wt Readings from Last 3 Encounters:  04/07/18 158 lb (71.7 kg)  03/17/18 156 lb 12.8 oz (71.1 kg)  10/30/17 159 lb (72.1 kg)      BMI Readings from Last 1 Encounters:  04/07/18 27.99 kg/m    *Unable to obtain current vital signs, weight, and BMI due to telephone visit type  Diane Grant seemed alert and oriented and participated appropriately during our telephone visit.  Advanced Directives 06/02/2018  Does Patient Have a Medical Advance Directive? No  Would patient like information on creating a medical advance directive? Yes (MAU/Ambulatory/Procedural Areas - Information given)    Hearing/Vision  . Diane Grant did not seem to have difficulty with hearing/understanding during the telephone conversation . Reports that she has not had a formal eye exam by an eye  care professional within the past year . Reports that she has not had a forma hearing evaluation within the past year *Unable to fully assess hearing and vision during telephone visit type  Cognitive Function: 6 CIT Screening  6CIT Screen 06/02/2018  What Year? 0 points  What month? 0 points  What time? 0 points  Count back from 20 0 points  Months in reverse 4 points  Repeat phrase 0 points  Total Score 4   (Normal:0-7, Significant for Dysfunction: >8) Normal Cognitive Function Screening: Yes   Immunizations and Health Maintenance Immunization History  Administered Date(s) Administered  . Influenza, High Dose Seasonal PF 11/15/2015, 02/24/2017, 12/04/2017  . Influenza,inj,Quad PF,6+ Mos 02/19/2017  . Pneumococcal Polysaccharide-23 03/28/2016  . Tdap 06/15/2015   Health Maintenance Due  Topic Date Due  . COLONOSCOPY  06/16/2000  . PNA vac Low Risk Adult (2 of 2 - PCV13) 03/28/2017   Health Maintenance  Topic Date Due  . COLONOSCOPY  06/16/2000  . PNA vac Low Risk Adult (2 of 2 - PCV13) 03/28/2017  . INFLUENZA VACCINE  09/06/2018  . MAMMOGRAM  04/02/2019  . TETANUS/TDAP  06/14/2025  . DEXA SCAN  Completed  . Hepatitis C Screening  Completed       Assessment:   This is a routine wellness examination for Diane Grant.    Plan:   Patient to keep appointment with Cardiology Patient aware if symptoms of SOB or Flutter get worse to immediately call 911.  Patient will follow up with Dr. Lajuana Ripple as planned. Patient will get pneumonia vaccine when she is able to come into office.    Goals    . awv goals     06/02/2018 AWV Goal: Keep All Scheduled Appointments  Over the next year, patient will attend all scheduled appointments with their PCP and any specialists that they see.  06/02/2018 AWV Goal: Exercise for General Health   Patient will verbalize understanding of the benefits of increased physical activity:  Exercising regularly is important. It will  improve your overall fitness, flexibility, and endurance.  Regular exercise also will improve your overall health. It can help you control your weight, reduce stress, and improve your bone density.  Over the next year, patient will increase physical activity as tolerated with a goal of at least 150 minutes of moderate physical activity per week.   You can tell that you are exercising at a moderate intensity if your heart starts beating faster and you start breathing faster but can still hold a conversation.  Moderate-intensity exercise ideas include:  Walking 1 mile (1.6 km) in about 15 minutes  Biking  Hiking  Golfing  Dancing  Water aerobics  Patient will verbalize understanding of everyday activities that increase physical activity by providing examples like the following: ? Yard work, such as: ? Pushing a Conservation officer, nature ? Raking and bagging leaves ? Washing your car ? Pushing a stroller ? Shoveling snow ? Gardening ? Washing windows or floors  Patient will be able to explain general safety guidelines for exercising:   Before you start a new exercise program, talk with your health care provider.  Do not exercise so much that you hurt yourself, feel dizzy, or get very short of breath.  Wear comfortable clothes and wear shoes with good support.  Drink plenty of water while you exercise to prevent dehydration or heat stroke.  Work out until your breathing and your heartbeat get faster.           Personalized Health Maintenance & Screening Recommendations  Pneumococcal vaccine  Colorectal cancer screening Advanced directives: has NO advanced directive  - add't info requested. Referral to SW: no  Lung Cancer Screening: Low Dose CT Chest recommended if Age 103-80 years, 30 pack-year currently smoking OR have quit w/in 15years. Patient does not qualify. Hepatitis C Screening recommended: yes HIV Screening recommended: yes  Referrals & Orders Placed: Referral placed  for colonoscopy     I have personally reviewed and noted the following in the patient's chart:   . Medical and social history . Use of alcohol, tobacco or illicit drugs  . Current medications and supplements . Functional ability and status . Nutritional status . Physical activity . Advanced directives . List of other physicians . Hospitalizations, surgeries, and ER visits in previous 12 months . Vitals . Screenings to include cognitive, depression, and falls . Referrals and appointments  In addition, I have reviewed and discussed with Diane Grant certain preventive protocols, quality metrics, and best practice recommendations. A written personalized care plan for preventive services as well as general preventive health recommendations is available and can be mailed to the patient at her request.       Lynnea Ferrier, LPN  2/70/7867  I have reviewed and agree with the above AWV documentation.   Evelina Dun, FNP

## 2018-06-02 NOTE — Patient Instructions (Signed)
  Diane Grant , Thank you for taking time to come for your Medicare Wellness Visit. I appreciate your ongoing commitment to your health goals. Please review the following plan we discussed and let me know if I can assist you in the future.   These are the goals we discussed: Goals    . awv goals     06/02/2018 AWV Goal: Keep All Scheduled Appointments  Over the next year, patient will attend all scheduled appointments with their PCP and any specialists that they see.  06/02/2018 AWV Goal: Exercise for General Health   Patient will verbalize understanding of the benefits of increased physical activity:  Exercising regularly is important. It will improve your overall fitness, flexibility, and endurance.  Regular exercise also will improve your overall health. It can help you control your weight, reduce stress, and improve your bone density.  Over the next year, patient will increase physical activity as tolerated with a goal of at least 150 minutes of moderate physical activity per week.   You can tell that you are exercising at a moderate intensity if your heart starts beating faster and you start breathing faster but can still hold a conversation.  Moderate-intensity exercise ideas include:  Walking 1 mile (1.6 km) in about 15 minutes  Biking  Hiking  Golfing  Dancing  Water aerobics  Patient will verbalize understanding of everyday activities that increase physical activity by providing examples like the following: ? Yard work, such as: ? Pushing a Conservation officer, nature ? Raking and bagging leaves ? Washing your car ? Pushing a stroller ? Shoveling snow ? Gardening ? Washing windows or floors  Patient will be able to explain general safety guidelines for exercising:   Before you start a new exercise program, talk with your health care provider.  Do not exercise so much that you hurt yourself, feel dizzy, or get very short of breath.  Wear comfortable clothes and wear shoes  with good support.  Drink plenty of water while you exercise to prevent dehydration or heat stroke.  Work out until your breathing and your heartbeat get faster.        This is a list of the screening recommended for you and due dates:  Health Maintenance  Topic Date Due  . Colon Cancer Screening  06/16/2000  . Pneumonia vaccines (2 of 2 - PCV13) 03/28/2017  . Flu Shot  09/06/2018  . Mammogram  04/02/2019  . Tetanus Vaccine  06/14/2025  . DEXA scan (bone density measurement)  Completed  .  Hepatitis C: One time screening is recommended by Center for Disease Control  (CDC) for  adults born from [birth year] through 1965.   Completed

## 2018-06-05 ENCOUNTER — Telehealth: Payer: Self-pay | Admitting: Cardiology

## 2018-06-05 NOTE — Telephone Encounter (Signed)
Patient gave consent ° ° °YOUR CARDIOLOGY TEAM HAS ARRANGED FOR AN E-VISIT FOR YOUR APPOINTMENT - PLEASE REVIEW IMPORTANT INFORMATION BELOW SEVERAL DAYS PRIOR TO YOUR APPOINTMENT ° °Due to the recent COVID-19 pandemic, we are transitioning in-person office visits to tele-medicine visits in an effort to decrease unnecessary exposure to our patients and staff. Medicare and most insurances are covering these visits without a copay needed. We also encourage you to sign up for MyChart if you have not already done so. You will need a smartphone if possible. For patients that do not have this, we can still complete the visit using a regular telephone but do prefer a smartphone to enable video when possible. You may have a close family member that lives with you that can help. If possible, we also ask that you have a blood pressure cuff and scale at home to measure your blood pressure, heart rate and weight prior to your scheduled appointment. Patients with clinical needs that need an in-person evaluation and testing will still be able to come to the office if absolutely necessary. If you have any questions, feel free to call our office. ° ° ° °IF YOU HAVE A SMARTPHONE, PLEASE DOWNLOAD THE WEBEX APP TO YOUR SMARTPHONE ° °- If Apple, go to App Store and type in WebEx in the search bar. Download [employer] Webex Meetings, the blue/green circle. The app is free but as with any other app download, your phone may require you to verify saved payment information or Apple password. You do NOT have to create a WebEx account. ° °- If Android, go to Google Play Store and type in WebEx in the search bar. Download [employer] Webex Meetings, the blue/green circle. The app is free but as with any other app download, your phone may require you to verify saved payment information or Android password. You do NOT have to create a WebEx account. ° °It is very helpful to have this downloaded before your visit. ° ° ° °2-3 DAYS BEFORE YOUR  APPOINTMENT ° °You will receive a telephone call from one of our [facility] team members - your caller ID may say "Unknown caller." If this is a video visit, we will confirm that you have been able to download the WebEx app. We will remind you check your blood pressure, heart rate and weight prior to your scheduled appointment. If you have an Apple Watch or Kardia, please upload any pertinent ECG strips the day before or morning of your appointment to MyChart. Our staff will also make sure you have reviewed the consent and agree to move forward with your scheduled tele-health visit.  ° ° ° °THE DAY OF YOUR APPOINTMENT ° °Approximately 15 minutes prior to your scheduled appointment, you will receive a telephone call from one of [facility] team - your caller ID may say "Unknown caller."  Our staff will confirm medications, vital signs for the day and any symptoms you may be experiencing. Please have this information available prior to the time of visit start. It may also be helpful for you to have a pad of paper and pen handy for any instructions given during your visit. They will also walk you through joining the WebEx smartphone meeting if this is a video visit. ° ° ° °CONSENT FOR TELE-HEALTH VISIT - PLEASE REVIEW ° °I hereby voluntarily request, consent and authorize [facility] and its employed or contracted physicians, physician assistants, nurse practitioners or other licensed health care professionals (the Practitioner), to provide me with telemedicine health   care services (the “Services") as deemed necessary by the treating Practitioner. I acknowledge and consent to receive the Services by the Practitioner via telemedicine. I understand that the telemedicine visit will involve communicating with the Practitioner through live audiovisual communication technology and the disclosure of certain medical information by electronic transmission. I acknowledge that I have been given the opportunity to request an  in-person assessment or other available alternative prior to the telemedicine visit and am voluntarily participating in the telemedicine visit. ° °I understand that I have the right to withhold or withdraw my consent to the use of telemedicine in the course of my care at any time, without affecting my right to future care or treatment, and that the Practitioner or I may terminate the telemedicine visit at any time. I understand that I have the right to inspect all information obtained and/or recorded in the course of the telemedicine visit and may receive copies of available information for a reasonable fee.  I understand that some of the potential risks of receiving the Services via telemedicine include:  °• Delay or interruption in medical evaluation due to technological equipment failure or disruption; °• Information transmitted may not be sufficient (e.g. poor resolution of images) to allow for appropriate medical decision making by the Practitioner; and/or  °• In rare instances, security protocols could fail, causing a breach of personal health information. ° °Furthermore, I acknowledge that it is my responsibility to provide information about my medical history, conditions and care that is complete and accurate to the best of my ability. I acknowledge that Practitioner's advice, recommendations, and/or decision may be based on factors not within their control, such as incomplete or inaccurate data provided by me or distortions of diagnostic images or specimens that may result from electronic transmissions. I understand that the practice of medicine is not an exact science and that Practitioner makes no warranties or guarantees regarding treatment outcomes. I acknowledge that I will receive a copy of this consent concurrently upon execution via email to the email address I last provided but may also request a printed copy by calling the office of [facility].   ° °I understand that my insurance will be  billed for this visit.  ° °I have read or had this consent read to me. °• I understand the contents of this consent, which adequately explains the benefits and risks of the Services being provided via telemedicine.  °• I have been provided ample opportunity to ask questions regarding this consent and the Services and have had my questions answered to my satisfaction. °• I give my informed consent for the services to be provided through the use of telemedicine in my medical care ° °By participating in this telemedicine visit I agree to the above. ° °

## 2018-06-15 NOTE — Progress Notes (Signed)
Virtual Visit via Telephone Note   This visit type was conducted due to national recommendations for restrictions regarding the COVID-19 Pandemic (e.g. social distancing) in an effort to limit this patient's exposure and mitigate transmission in our community.  Due to her co-morbid illnesses, this patient is at least at moderate risk for complications without adequate follow up.  This format is felt to be most appropriate for this patient at this time.  The patient did not have access to video technology/had technical difficulties with video requiring transitioning to audio format only (telephone).  All issues noted in this document were discussed and addressed.  No physical exam could be performed with this format.  Please refer to the patient's chart for her  consent to telehealth for [facility].   Date:  06/16/2018   ID:  Diane Grant, DOB 1950-12-06, MRN [id number]  Patient Location: Home Provider Location: Home  PCP:  [name], DO  Cardiologist:  Minus Breeding, MD  Electrophysiologist:  None   Evaluation Performed:  New Patient Evaluation  Chief Complaint:  SOB  History of Present Illness:    Diane Grant is a 68 y.o. female who was referred by [name], DO for evaluation of dyspnea and chest pain.    The patient has no past cardiac history other than treadmill years ago.  Last summer she had an episode of acute shortness of breath.  This lasted for a couple of minutes.  She was getting out of the car and she was outside.  She just felt like she could not breathe.  It passed after a few minutes but it was very frightening to her.  She had a similar episode this summer.  She says that in was diagnosed as the flu.  She has a lot of cough and wheezing and shortness of breath with this.  She does not think she ever actually recovered.  She has less breath to do activities such as minimal housework than she had before.  She is being treated for  bronchospasm with Symbicort for the last month or so and she has not really noticed a difference.  She has a little leg swelling but her weights have been stable.  She is not describing new PND or orthopnea.  She is not describing palpitations, presyncope or syncope.  She is having some occasional left-sided chest discomfort that is under her left breast ever since her flu.  This is sharp and 2 out of 10 in intensity.  It is kind of just there probably waxing and waning.  She cannot make it come on.  Does not radiate.  Is not associated with other symptoms..  He has no fevers or chills.  However, she has somewhat of a nonproductive cough.  Apparently she and her husband have just been tested for coronavirus because of the continuing complaints.  This was at the insistence of her daughters.  I do note that she had a chest x-ray last month that was unremarkable.  The patient does not have symptoms concerning for COVID-19 infection (fever, chills, cough, or new shortness of breath).    Past Medical History:  Diagnosis Date   Degenerative disc disease, lumbar    Hypertension    Thyroid disease    hypothyroidism   Past Surgical History:  Procedure Laterality Date   ABDOMINAL HYSTERECTOMY     ROTATOR CUFF REPAIR  11/30/2015   Right shoulder     Prior to Admission medications   Medication  Sig Start Date End Date Taking? Authorizing Provider  albuterol (VENTOLIN HFA) 108 (90 Base) MCG/ACT inhaler Inhale 2 puffs into the lungs every 6 (six) hours as needed for wheezing or shortness of breath. 05/24/18  Yes Gottschalk, Ashly M, DO  budesonide-formoterol (SYMBICORT) 80-4.5 MCG/ACT inhaler Inhale 2 puffs into the lungs 2 (two) times a day. (rinse mouth after use) 05/24/18  Yes Gottschalk, Ashly M, DO  hydrochlorothiazide (HYDRODIURIL) 25 MG tablet Take 1 tablet (25 mg total) by mouth daily. 01/13/18  Yes Ronnie Doss M, DO  hydrOXYzine (ATARAX/VISTARIL) 10 MG tablet Take 1 tablet (10 mg total) by  mouth 3 (three) times daily as needed for itching. 03/18/18  Yes Terald Sleeper, PA-C  levothyroxine (SYNTHROID, LEVOTHROID) 75 MCG tablet TAKE 1 TABLET BY MOUTH ONCE DAILY BEFORE BREAKFAST 04/14/18  Yes Gottschalk, Ashly M, DO  losartan (COZAAR) 100 MG tablet Take 1 tablet (100 mg total) by mouth daily. 01/13/18  Yes Gottschalk, Leatrice Jewels M, DO  meclizine (ANTIVERT) 25 MG tablet Take 1 tablet (25 mg total) by mouth 3 (three) times daily as needed for dizziness. 03/17/18  Yes Terald Sleeper, PA-C  meloxicam (MOBIC) 7.5 MG tablet Take 1 tablet (7.5 mg total) by mouth 2 (two) times daily as needed for pain. 12/02/17  Yes Gottschalk, Leatrice Jewels M, DO  methocarbamol (ROBAXIN) 500 MG tablet Take 1 tablet (500 mg total) by mouth 4 (four) times daily as needed for muscle spasms. 07/25/17  Yes Gottschalk, Ashly M, DO  omeprazole (PRILOSEC) 20 MG capsule Take 20 mg by mouth as needed.   Yes [provider]     Allergies:   Anesthesia s-i-40 [propofol]   Social History   Tobacco Use   Smoking status: Never Smoker   Smokeless tobacco: Never Used  Substance Use Topics   Alcohol use: No   Drug use: No     Family Hx: The patient's family history includes Arthritis in her mother; Asthma in her maternal grandmother and sister; Cancer in her brother; Healthy in her daughter, daughter, daughter, daughter, and daughter; Heart attack in her father and maternal grandmother; Heart disease in her brother and father. There is no history of Thyroid cancer, Colon cancer, Breast cancer, Prostate cancer, or Ovarian cancer.  ROS:   Please see the history of present illness.    As stated in the HPI and negative for all other systems.   Prior CV studies:   The following studies were reviewed today:    Labs/Other Tests and Data Reviewed:    EKG:  04/07/18  NSR, rate 72, axis WNL, intervals WNL, no acute ST T wave changes.    Recent Labs: 04/07/2018: ALT 17; BUN 10; Creatinine, Ser 0.69; Potassium 4.5; Sodium  140 04/11/2018: Hemoglobin 14.4; Platelets 298   Recent Lipid Panel No results found for: CHOL, TRIG, HDL, CHOLHDL, LDLCALC, LDLDIRECT  Wt Readings from Last 3 Encounters:  06/16/18 155 lb (70.3 kg)  04/07/18 158 lb (71.7 kg)  03/17/18 156 lb 12.8 oz (71.1 kg)     Objective:    Vital Signs:  Ht 5\' 3"  (1.6 m)    Wt 155 lb (70.3 kg)    BMI 27.46 kg/m    VITAL SIGNS:  reviewed  ASSESSMENT & PLAN:    DOE:    I am going to have to see the patient back in the office to further assess.  I suspect this is noncardiac but would need to examine her, check a BNP level and potentially order an echocardiogram  based on these findings.  For now I am not to make any changes to her regimen.  CHEST PAIN: She has some atypical and infrequent chest pain.  I do not have a strong suspicion for obstructive coronary disease with a normal EKG and the description of her symptoms but will further evaluate as appropriate after I seen her.  PALPITATIONS: These are infrequent and not causing any problems.  No further testing for these in particular is indicated.  COVID-19 Education: The signs and symptoms of COVID-19 were discussed with the patient and how to seek care for testing (follow up with PCP or arrange E-visit).  The importance of social distancing was discussed today.  Time:   Today, I have spent 25 minutes with the patient with telehealth technology discussing the above problems.     Medication Adjustments/Labs and Tests Ordered: Current medicines are reviewed at length with the patient today.  Concerns regarding medicines are outlined above.   Tests Ordered: No orders of the defined types were placed in this encounter.   Medication Changes: No orders of the defined types were placed in this encounter.   Disposition:  Follow up in the office the next time I am there.   Signed, Minus Breeding, MD  06/16/2018 2:09 PM    [place]

## 2018-06-16 ENCOUNTER — Encounter: Payer: Self-pay | Admitting: Cardiology

## 2018-06-16 ENCOUNTER — Telehealth (INDEPENDENT_AMBULATORY_CARE_PROVIDER_SITE_OTHER): Payer: Medicare Other | Admitting: Cardiology

## 2018-06-16 VITALS — Ht 63.0 in | Wt 155.0 lb

## 2018-06-16 DIAGNOSIS — R079 Chest pain, unspecified: Secondary | ICD-10-CM | POA: Insufficient documentation

## 2018-06-16 DIAGNOSIS — R0602 Shortness of breath: Secondary | ICD-10-CM | POA: Insufficient documentation

## 2018-06-16 DIAGNOSIS — Z7189 Other specified counseling: Secondary | ICD-10-CM

## 2018-06-16 DIAGNOSIS — R002 Palpitations: Secondary | ICD-10-CM

## 2018-06-16 NOTE — Patient Instructions (Signed)
Your physician recommends that you schedule a follow-up appointment in person office visit with Dr Percival Spanish in [place]  Your physician recommends that you continue on your current medications as directed. Please refer to the Current Medication list given to you today.  Thank you for choosing [facility]!!

## 2018-06-18 ENCOUNTER — Encounter: Payer: Self-pay | Admitting: Family Medicine

## 2018-06-18 ENCOUNTER — Ambulatory Visit (INDEPENDENT_AMBULATORY_CARE_PROVIDER_SITE_OTHER): Payer: Medicare Other | Admitting: Family Medicine

## 2018-06-18 ENCOUNTER — Other Ambulatory Visit: Payer: Self-pay

## 2018-06-18 VITALS — BP 130/70 | HR 84

## 2018-06-18 DIAGNOSIS — R0602 Shortness of breath: Secondary | ICD-10-CM

## 2018-06-18 NOTE — Progress Notes (Signed)
Telephone visit  Subjective: CC: shortness of breath PCP: [name], DO WYO:Diane WESLIE RASMUS is a 68 y.o. female calls for telephone consult today. Patient provides verbal consent for consult held via phone.  Location of patient: home Location of provider: [facility] Others present for call: none  1. Shortness of breath She reports that this morning she woke up short of breath and that things are getting worse.  She reports feeling like her heart flutters and that it radiates to her right chest.  Reports cough is getting better.  She reports chills.  No measures fevers.  She reports rhinorrhea but it improves with allergy pill.  She reports hoarseness.  She had a COVID-19 test done recently which was negative.  She denies any hemoptysis.  She is not using the albuterol regularly but when she does use it it does seem to help some with the shortness of breath.  She reports compliance with Symbicort.  No chills, unplanned weight loss.  Her mother was recently diagnosed with metastatic lung cancer.  Patient is a non-smoker.   ROS: Per HPI  Allergies  Allergen Reactions  . Anesthesia S-I-40 [Propofol]     Per patient need to be careful when giving this   Past Medical History:  Diagnosis Date  . Degenerative disc disease, lumbar   . Hypertension   . Thyroid disease    hypothyroidism    Current Outpatient Medications:  .  albuterol (VENTOLIN HFA) 108 (90 Base) MCG/ACT inhaler, Inhale 2 puffs into the lungs every 6 (six) hours as needed for wheezing or shortness of breath., Disp: 1 Inhaler, Rfl: 0 .  budesonide-formoterol (SYMBICORT) 80-4.5 MCG/ACT inhaler, Inhale 2 puffs into the lungs 2 (two) times a day. (rinse mouth after use), Disp: 1 Inhaler, Rfl: 2 .  hydrochlorothiazide (HYDRODIURIL) 25 MG tablet, Take 1 tablet (25 mg total) by mouth daily., Disp: 90 tablet, Rfl: 3 .  hydrOXYzine (ATARAX/VISTARIL) 10 MG tablet, Take 1 tablet (10 mg total) by mouth 3 (three) times daily as  needed for itching., Disp: 40 tablet, Rfl: 0 .  levothyroxine (SYNTHROID, LEVOTHROID) 75 MCG tablet, TAKE 1 TABLET BY MOUTH ONCE DAILY BEFORE BREAKFAST, Disp: 90 tablet, Rfl: 0 .  losartan (COZAAR) 100 MG tablet, Take 1 tablet (100 mg total) by mouth daily., Disp: 90 tablet, Rfl: 3 .  meclizine (ANTIVERT) 25 MG tablet, Take 1 tablet (25 mg total) by mouth 3 (three) times daily as needed for dizziness., Disp: 30 tablet, Rfl: 0 .  meloxicam (MOBIC) 7.5 MG tablet, Take 1 tablet (7.5 mg total) by mouth 2 (two) times daily as needed for pain., Disp: 180 tablet, Rfl: 0 .  methocarbamol (ROBAXIN) 500 MG tablet, Take 1 tablet (500 mg total) by mouth 4 (four) times daily as needed for muscle spasms., Disp: 40 tablet, Rfl: 0 .  omeprazole (PRILOSEC) 20 MG capsule, Take 20 mg by mouth as needed., Disp: , Rfl:   Blood pressure 130/70, pulse 84.  Assessment/ Plan: 68 y.o. female    1. Shortness of breath Continues to have shortness of breath with intermittent worsening.  I am concerned about her breathing and I am unsure if this is related to a possible COVID-19 infection.  She was recently tested for active virus which was negative.  She would warrant antibody testing if this is available.  I have advised her to use the albuterol more frequently for the next day or so and to continue the Symbicort as directed.  She has plans for cardiac  work-up with cardiology soon.  I did advise her that she should consider evaluation emergency department but she declined this today.  She "promises" to seek evaluation if symptoms get worse though.   Start time: 1:06am End time: 1:14am  Total time spent on patient care (including telephone call/ virtual visit): 15 minutes  [place], [place] [phone]

## 2018-06-19 ENCOUNTER — Telehealth: Payer: Self-pay

## 2018-06-19 NOTE — Telephone Encounter (Signed)
Receive call from patient daughter, Diane Grant, worried her mother needs to see to be seen in person for SOB. She stated that last night the SOB got so bad that her mom used her sisters nebulizer for a breathing treatment and it seemed to help some. She informed me that her mother is scheduled for Pulm. Consult tomorrow 5/15. I educated her that that is a good idea to have that consult and to have a cardiology appointment to make rule out any heart problems. She agreed and very appreciative for the call. Pt is schedule to see DOD on Monday 5/18 @ 10:40p.  Daughter educated that she nor other family will not be able to come in visit with her but can have family on speaker phone to be a part of her care.

## 2018-06-19 NOTE — Telephone Encounter (Signed)
Thank you for handling this.

## 2018-06-20 ENCOUNTER — Ambulatory Visit (INDEPENDENT_AMBULATORY_CARE_PROVIDER_SITE_OTHER): Payer: Medicare Other | Admitting: Pulmonary Disease

## 2018-06-20 ENCOUNTER — Encounter: Payer: Self-pay | Admitting: Pulmonary Disease

## 2018-06-20 ENCOUNTER — Other Ambulatory Visit: Payer: Self-pay

## 2018-06-20 ENCOUNTER — Other Ambulatory Visit: Payer: Medicare Other

## 2018-06-20 VITALS — BP 118/72 | HR 86 | Ht 63.0 in | Wt 157.8 lb

## 2018-06-20 DIAGNOSIS — R0602 Shortness of breath: Secondary | ICD-10-CM | POA: Diagnosis not present

## 2018-06-20 DIAGNOSIS — E039 Hypothyroidism, unspecified: Secondary | ICD-10-CM | POA: Diagnosis not present

## 2018-06-20 NOTE — Patient Instructions (Addendum)
We will check some blood test today including comprehensive metabolic panel, CBC with differential, IgE levels, troponin, BNP and TSH Continue using your inhalers as prescribed We can consider steroids but will await the cardiology evaluation before prescribing this Please monitor your symptoms and go to emergency room if there is any worsening I will follow back with you in 1 to 2 weeks

## 2018-06-20 NOTE — Progress Notes (Signed)
Diane Grant    [id number]    1950/07/26  Primary Care Physician:Gottschalk, Koleen Distance, DO  Referring Physician: [name], DO [place], [place] [address]  Chief complaint: Consult for dyspnea  HPI: 68 year old with history of hypertension, hypothyroidism Complains of dyspnea since February 2020.  Symptoms started after confirmed episode of flu a infection.  Has dyspnea on exertion and at rest, no cough.  Occasional wheezing.  Also complains of left chest pain with occasional radiation to the right.  No palpitations Has symptoms of allergic rhinitis exacerbated by pollen.  Reports a couple of episodes of acute shortness of breath last summer on exposure to pollen. Denies any heartburn, acid reflux  Started on Symbicort and albuterol inhaler by her primary care physician few months ago but she has noticed any difference.  She is due to see Dr. Ellyn Hack, cardiology on 5/18 for evaluation She was recently tested for COVID-19 at community testing site at [facility] on 5/7.  As per the patient the test was negative.  Denies any fevers, chills, sick contacts or recent travel.  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   budesonide-formoterol (SYMBICORT) 80-4.5 MCG/ACT inhaler Inhale 2 puffs into the lungs 2 (two) times a day. (rinse mouth after use)   hydrochlorothiazide (HYDRODIURIL) 25 MG tablet Take 1 tablet (25 mg total) by mouth daily.   hydrOXYzine  (ATARAX/VISTARIL) 10 MG tablet Take 1 tablet (10 mg total) by mouth 3 (three) times daily as needed for itching.   levothyroxine (SYNTHROID, LEVOTHROID) 75 MCG tablet TAKE 1 TABLET BY MOUTH ONCE DAILY BEFORE BREAKFAST   losartan (COZAAR) 100 MG tablet Take 1 tablet (100 mg total) by mouth daily.   meclizine (ANTIVERT) 25 MG tablet Take 1 tablet (25 mg total) by mouth 3 (three) times daily as needed for dizziness.   meloxicam (MOBIC) 7.5 MG tablet Take 1 tablet (7.5 mg total) by mouth 2 (two) times daily as needed for pain.   methocarbamol (ROBAXIN) 500 MG tablet Take 1 tablet (500 mg total) by mouth 4 (four) times daily as needed for muscle spasms.   omeprazole (PRILOSEC) 20 MG capsule Take 20 mg by mouth as needed.   No facility-administered encounter medications on file as of 06/20/2018.     Allergies as of 06/20/2018 - Review Complete 06/20/2018  Allergen Reaction Noted   Anesthesia s-i-40 [propofol]  03/08/2017    Past Medical History:  Diagnosis Date   Degenerative disc disease, lumbar    Hypertension    Thyroid disease    hypothyroidism    Past Surgical History:  Procedure Laterality Date   ABDOMINAL HYSTERECTOMY     ROTATOR CUFF REPAIR  11/30/2015   Right shoulder    Family History  Problem Relation Age of Onset   Arthritis Mother    Heart attack Father        28s MI   Heart disease Father    Asthma Sister    Heart disease Brother  Asthma Maternal Grandmother    Heart attack Maternal Grandmother    Cancer Brother        liver   Healthy Daughter    Healthy Daughter    Healthy Daughter    Healthy Daughter    Healthy Daughter    Thyroid cancer Neg Hx    Colon cancer Neg Hx    Breast cancer Neg Hx    Prostate cancer Neg Hx    Ovarian cancer Neg Hx     Social History   Socioeconomic History   Marital status: Married    Spouse name: Jeneen Rinks   Number of children: 5   Years of education: 12   Highest education level: High  school graduate  Occupational History   Not on file  Social Needs   Financial resource strain: Not hard at all   Food insecurity:    Worry: Never true    Inability: Never true   Transportation needs:    Medical: No    Non-medical: No  Tobacco Use   Smoking status: Never Smoker   Smokeless tobacco: Never Used  Substance and Sexual Activity   Alcohol use: No   Drug use: No   Sexual activity: Not on file  Lifestyle   Physical activity:    Days per week: 3 days    Minutes per session: 30 min   Stress: Not at all  Relationships   Social connections:    Talks on phone: More than three times a week    Gets together: More than three times a week    Attends religious service: More than 4 times per year    Active member of club or organization: Yes    Attends meetings of clubs or organizations: More than 4 times per year    Relationship status: Married   Intimate partner violence:    Fear of current or ex partner: No    Emotionally abused: No    Physically abused: No    Forced sexual activity: No  Other Topics Concern   Not on file  Social History Narrative   Lives with husband.      Review of systems: Review of Systems  Constitutional: Negative for fever and chills.  HENT: Negative.   Eyes: Negative for blurred vision.  Respiratory: as per HPI  Cardiovascular: Negative for chest pain and palpitations.  Gastrointestinal: Negative for vomiting, diarrhea, blood per rectum. Genitourinary: Negative for dysuria, urgency, frequency and hematuria.  Musculoskeletal: Negative for myalgias, back pain and joint pain.  Skin: Negative for itching and rash.  Neurological: Negative for dizziness, tremors, focal weakness, seizures and loss of consciousness.  Endo/Heme/Allergies: Negative for environmental allergies.  Psychiatric/Behavioral: Negative for depression, suicidal ideas and hallucinations.  All other systems reviewed and are negative.  Physical Exam: Blood  pressure 118/72, pulse 86, height 5\' 3"  (1.6 m), weight 157 lb 12.8 oz (71.6 kg), SpO2 99 %. Gen:      No acute distress HEENT:  EOMI, sclera anicteric Neck:     No masses; no thyromegaly Lungs:    Clear to auscultation bilaterally; normal respiratory effort CV:         Regular rate and rhythm; no murmurs Abd:      + bowel sounds; soft, non-tender; no palpable masses, no distension Ext:    No edema; adequate peripheral perfusion Skin:      Warm and dry; no rash Neuro: alert and oriented x 3 Psych: normal mood and affect  Data Reviewed: Imaging: Chest x-ray  05/28/2018- clear lungs.  No active cardiopulmonary disease.  Labs: CBC 04/11/2018-WBC 9.5, hemoglobin 14.4, platelets 298, eos 2%, absolute eosinophil count 190  EKG 04/07/2018- normal sinus rhythm, no acute ST-T changes, heart rate 72  Assessment:  Evaluation for dyspnea Low suspicion for COPD as she is a non-smoker.  It is possible that she may have asthma due to history of allergies and family history of asthma but no wheezing noted on examination today She is currently on Symbicort inhaler.  Unfortunately we are unable to do PFTs or FENO for the time being due to COVID restrictions on aerosolizing procedures. Discussed empiric short course of prednisone to see if it will help with her symptoms but she prefers to wait Agree with cardiology evaluation We will check some labs today including CBC with differential to evaluate peripheral eosinophilia and IgE.  We will also check TSH, BNP and troponin  Follow-up in 1 to 2 weeks Advised her to seek care at the emergency room if there is worsening of symptoms.   Plan/Recommendations: - Continue Symbicort, albuterol as needed - Cardiology eval pending - Check labs including CMP, CBC with differential, IgE, troponin, BNP and TSH.  Marshell Garfinkel MD  Pulmonary and Critical Care 06/20/2018, 3:59 PM  CC: [name], DO

## 2018-06-23 ENCOUNTER — Other Ambulatory Visit: Payer: Self-pay

## 2018-06-23 ENCOUNTER — Ambulatory Visit (INDEPENDENT_AMBULATORY_CARE_PROVIDER_SITE_OTHER): Payer: Medicare Other | Admitting: Cardiology

## 2018-06-23 VITALS — BP 114/60 | HR 67 | Temp 97.7°F | Ht 63.0 in | Wt 157.4 lb

## 2018-06-23 DIAGNOSIS — R002 Palpitations: Secondary | ICD-10-CM | POA: Diagnosis not present

## 2018-06-23 DIAGNOSIS — I10 Essential (primary) hypertension: Secondary | ICD-10-CM

## 2018-06-23 DIAGNOSIS — R0789 Other chest pain: Secondary | ICD-10-CM | POA: Diagnosis not present

## 2018-06-23 DIAGNOSIS — R0602 Shortness of breath: Secondary | ICD-10-CM

## 2018-06-23 DIAGNOSIS — R079 Chest pain, unspecified: Secondary | ICD-10-CM

## 2018-06-23 LAB — IGE: IgE (Immunoglobulin E), Serum: 740 kU/L — ABNORMAL HIGH (ref <=114)

## 2018-06-23 LAB — CBC WITH DIFFERENTIAL/PLATELET
Absolute Monocytes: 546 cells/uL (ref 200–950)
Basophils Absolute: 11 cells/uL (ref 0–200)
Basophils Relative: 0.1 %
Eosinophils Absolute: 74 cells/uL (ref 15–500)
Eosinophils Relative: 0.7 %
HCT: 43.4 % (ref 35.0–45.0)
Hemoglobin: 15.2 g/dL (ref 11.7–15.5)
Lymphs Abs: 2321 cells/uL (ref 850–3900)
MCH: 30.8 pg (ref 27.0–33.0)
MCHC: 35 g/dL (ref 32.0–36.0)
MCV: 87.9 fL (ref 80.0–100.0)
MPV: 10.3 fL (ref 7.5–12.5)
Monocytes Relative: 5.2 %
Neutro Abs: 7550 cells/uL (ref 1500–7800)
Neutrophils Relative %: 71.9 %
Platelets: 324 10*3/uL (ref 140–400)
RBC: 4.94 10*6/uL (ref 3.80–5.10)
RDW: 12.8 % (ref 11.0–15.0)
Total Lymphocyte: 22.1 %
WBC: 10.5 10*3/uL (ref 3.8–10.8)

## 2018-06-23 LAB — COMPREHENSIVE METABOLIC PANEL
AG Ratio: 1.6 (calc) (ref 1.0–2.5)
ALT: 16 U/L (ref 6–29)
AST: 28 U/L (ref 10–35)
Albumin: 4.6 g/dL (ref 3.6–5.1)
Alkaline phosphatase (APISO): 62 U/L (ref 37–153)
BUN: 16 mg/dL (ref 7–25)
CO2: 26 mmol/L (ref 20–32)
Calcium: 10.5 mg/dL — ABNORMAL HIGH (ref 8.6–10.4)
Chloride: 102 mmol/L (ref 98–110)
Creat: 0.73 mg/dL (ref 0.50–0.99)
Globulin: 2.8 g/dL (calc) (ref 1.9–3.7)
Glucose, Bld: 90 mg/dL (ref 65–99)
Potassium: 3.8 mmol/L (ref 3.5–5.3)
Sodium: 140 mmol/L (ref 135–146)
Total Bilirubin: 0.7 mg/dL (ref 0.2–1.2)
Total Protein: 7.4 g/dL (ref 6.1–8.1)

## 2018-06-23 LAB — TSH: TSH: 1.76 mIU/L (ref 0.40–4.50)

## 2018-06-23 LAB — TROPONIN I

## 2018-06-23 LAB — BRAIN NATRIURETIC PEPTIDE: Brain Natriuretic Peptide: 10 pg/mL (ref <100)

## 2018-06-23 NOTE — Patient Instructions (Addendum)
Medication Instructions:  Your physician recommends that you continue on your current medications as directed. Please refer to the Current Medication list given to you today.  If you need a refill on your cardiac medications before your next appointment, please call your pharmacy.   Lab work: NONE   Testing/Procedures: Your physician has requested that you have an echocardiogram. Echocardiography is a painless test that uses sound waves to create images of your heart. It provides your doctor with information about the size and shape of your heart and how well your heart's chambers and valves are working. This procedure takes approximately one hour. There are no restrictions for this procedure. [place] [address]   Your physician has requested that you have a lexiscan myoview. For further information please visit [email or web address]. Please follow instruction sheet, as given.  Follow-Up: At [facility], you and your health needs are our priority.  As part of our continuing mission to provide you with exceptional heart care, we have created designated Provider Care Teams.  These Care Teams include your primary Cardiologist (physician) and Advanced Practice Providers (APPs -  Physician Assistants and Nurse Practitioners) who all work together to provide you with the care you need, when you need it.  WITH DR [facility] AFTER TESTING COMPLETED   THE OFFICE WILL CALL YOU TO SCHEDULE YOUR APPOINTMENTS   Echocardiogram An echocardiogram is a procedure that uses painless sound waves (ultrasound) to produce an image of the heart. Images from an echocardiogram can provide important information about:  Signs of coronary artery disease (CAD).  Aneurysm detection. An aneurysm is a weak or damaged part of an artery wall that bulges out from the normal force of blood pumping through the body.  Heart size and shape. Changes in the size or shape of the heart can be associated  with certain conditions, including heart failure, aneurysm, and CAD.  Heart muscle function.  Heart valve function.  Signs of a past heart attack.  Fluid buildup around the heart.  Thickening of the heart muscle.  A tumor or infectious growth around the heart valves. Tell a health care provider about:  Any allergies you have.  All medicines you are taking, including vitamins, herbs, eye drops, creams, and over-the-counter medicines.  Any blood disorders you have.  Any surgeries you have had.  Any medical conditions you have.  Whether you are pregnant or may be pregnant. What are the risks? Generally, this is a safe procedure. However, problems may occur, including:  Allergic reaction to dye (contrast) that may be used during the procedure. What happens before the procedure? No specific preparation is needed. You may eat and drink normally. What happens during the procedure?   An IV tube may be inserted into one of your veins.  You may receive contrast through this tube. A contrast is an injection that improves the quality of the pictures from your heart.  A gel will be applied to your chest.  A wand-like tool (transducer) will be moved over your chest. The gel will help to transmit the sound waves from the transducer.  The sound waves will harmlessly bounce off of your heart to allow the heart images to be captured in real-time motion. The images will be recorded on a computer. The procedure may vary among health care providers and hospitals. What happens after the procedure?  You may return to your normal, everyday life, including diet, activities, and medicines, unless your health care provider tells you  not to do that. Summary  An echocardiogram is a procedure that uses painless sound waves (ultrasound) to produce an image of the heart.  Images from an echocardiogram can provide important information about the size and shape of your heart, heart muscle function,  heart valve function, and fluid buildup around your heart.  You do not need to do anything to prepare before this procedure. You may eat and drink normally.  After the echocardiogram is completed, you may return to your normal, everyday life, unless your health care provider tells you not to do that. This information is not intended to replace advice given to you by your health care provider. Make sure you discuss any questions you have with your health care provider. Document Released: 01/20/2000 Document Revised: 02/25/2016 Document Reviewed: 02/25/2016 Elsevier Interactive Patient Education  2019 [place].   Cardiac Nuclear Scan A cardiac nuclear scan is a test that is done to check the flow of blood to your heart. It is done when you are resting and when you are exercising. The test looks for problems such as:  Not enough blood reaching a portion of the heart.  The heart muscle not working as it should. You may need this test if:  You have heart disease.  You have had lab results that are not normal.  You have had heart surgery or a balloon procedure to open up blocked arteries (angioplasty).  You have chest pain.  You have shortness of breath. In this test, a special dye (tracer) is put into your bloodstream. The tracer will travel to your heart. A camera will then take pictures of your heart to see how the tracer moves through your heart. This test is usually done at a hospital and takes 2-4 hours. Tell a doctor about:  Any allergies you have.  All medicines you are taking, including vitamins, herbs, eye drops, creams, and over-the-counter medicines.  Any problems you or family members have had with anesthetic medicines.  Any blood disorders you have.  Any surgeries you have had.  Any medical conditions you have.  Whether you are pregnant or may be pregnant. What are the risks? Generally, this is a safe test. However, problems may occur, such as:  Serious  chest pain and heart attack. This is only a risk if the stress portion of the test is done.  Rapid heartbeat.  A feeling of warmth in your chest. This feeling usually does not last long.  Allergic reaction to the tracer. What happens before the test?  Ask your doctor about changing or stopping your normal medicines. This is important.  Follow instructions from your doctor about what you cannot eat or drink.  Remove your jewelry on the day of the test. What happens during the test?  An IV tube will be inserted into one of your veins.  Your doctor will give you a small amount of tracer through the IV tube.  You will wait for 20-40 minutes while the tracer moves through your bloodstream.  Your heart will be monitored with an electrocardiogram (ECG).  You will lie down on an exam table.  Pictures of your heart will be taken for about 15-20 minutes.  You may also have a stress test. For this test, one of these things may be done: ? You will be asked to exercise on a treadmill or a stationary bike. ? You will be given medicines that will make your heart work harder. This is done if you are unable to  exercise.  When blood flow to your heart has peaked, a tracer will again be given through the IV tube.  After 20-40 minutes, you will get back on the exam table. More pictures will be taken of your heart.  Depending on the tracer that is used, more pictures may need to be taken 3-4 hours later.  Your IV tube will be removed when the test is over. The test may vary among doctors and hospitals. What happens after the test?  Ask your doctor: ? Whether you can return to your normal schedule, including diet, activities, and medicines. ? Whether you should drink more fluids. This will help to remove the tracer from your body. Drink enough fluid to keep your pee (urine) pale yellow.  Ask your doctor, or the department that is doing the test: ? When will my results be ready? ? How will I  get my results? Summary  A cardiac nuclear scan is a test that is done to check the flow of blood to your heart.  Tell your doctor whether you are pregnant or may be pregnant.  Before the test, ask your doctor about changing or stopping your normal medicines. This is important.  Ask your doctor whether you can return to your normal activities. You may be asked to drink more fluids. This information is not intended to replace advice given to you by your health care provider. Make sure you discuss any questions you have with your health care provider. Document Released: 07/08/2017 Document Revised: 07/08/2017 Document Reviewed: 07/08/2017 Elsevier Interactive Patient Education  2019 [employer].

## 2018-06-23 NOTE — Progress Notes (Signed)
PCP: [name], DO  Cardiologist: Dr. Percival Spanish Pulmonologist: Dr. Marshell Garfinkel  Clinic Note: Chief Complaint  Patient presents with   Shortness of Breath    Required in person evaluation per Dr. Percival Spanish   Cough   Chest Pain    Off and on, worse with cough    HPI:  Diane Grant is a 68 y.o. female who is being seen today for the evaluation of shortness of breath at the request of Dr. Percival Spanish.  Diane Grant initially evaluated via telehealth medicine by Dr. Percival Spanish as part of initial consultation at the request of Diane Doss M, DO. -->  This episode noted that several years ago the patient was evaluated with a treadmill stress test that was negative.  Last summer she had several episodes of acute onset dyspnea.  She then had several episodes this summer as well.  Symptoms seem to have begun since an episode of "influenza" back in February.  She had lots of coughing, wheezing and dyspnea associated.  She feels as though she never recovered from this.  She is continued to have coughing but is now short of breath with doing just any minimal activity.  She also noted having some left-sided sharp chest discomfort ever since having the flu -these symptoms wax and wane. Dr. Percival Spanish felt that we needed to have physical examination and an in person evaluation, so she was placed on my schedule today.  He did not feel that the chest discomfort was probably cardiac in nature, however did feel that we need to exclude a cardiac etiology for the dyspnea.  Since her visit with Dr. Percival Spanish, she has been seen again by her PCP as well as Dr. Vaughan Browner from pulmonary medicine. PCP noted that dyspnea seems to be getting worse as are the fluttering sensations and chest discomfort.  Noted that a COVID test was negative.  She really has not noticed any benefit from using Symbicort. ->  Dr. Vaughan Browner felt that this was low likely to be related to COPD, but could potentially be  asthma/allergy related.  Decided to hold off on empiric dose of steroids until cardiac evaluation.  Recent Hospitalizations: None  Studies Personally Reviewed - (if available, images/films reviewed: From Epic Chart or Care Everywhere)  Chest x-ray from April 22: No active CP disease.  Interval History:  I am seeing Lajuanda today, for in person evaluation based on telephone call last week where she apparently was getting much worse.  The plan was initially for her to be seen by Dr. Percival Spanish, but the appointment was pushed up because of concerns for worsening symptoms.  Today she reiterates a lot of the symptoms noted above.  She tells me that she is pretty much been short of breath ever since the flu episode back in February.  Is also been coughing.  She says that a lot of it chest discomfort that she is feeling is with coughing and with certain positioning.  She really has not been doing any activity to notice if there is any exacerbation with activity.  She does however note that she gets short of breath with pretty much doing anything.  His baseline dyspneic, but certainly walking back to the clinic room today made her short of breath. She has not had any fevers since the febrile episode, but has had chills and feels cold all the time.  She has not felt these fluttering in her chest episodes off and on: 8-10 episodes lasting 1 minute in  the last month; episodes are not long enough to have any syncope or near syncope, but she does have some dizziness and shortness of breath associated with the longer spells. Chest discomfort that is really along the lower rib cage.  Worse with coughing.  However she also has had some discomfort in other areas of her chest.  Has not really been all that active and therefore would be difficult to tell if it is made worse with activity.  Has noted both resting and exertional dyspnea.  Not active enough necessarily to note exertional chest pain. No PND, orthopnea with  maybe mild left leg versus right swelling.   No syncope/near syncope orTIA/amaurosis fugax symptoms. No melena, hematochezia, hematuria, or epstaxis. No claudication.  ROS: A comprehensive was performed. Review of Systems  Constitutional: Positive for chills (Stays cold.  No Rigors) and malaise/fatigue. Negative for weight loss.       No fevers or chills since her "flu "in February.  HENT: Positive for congestion. Negative for nosebleeds.        Rhinorrhea that improved with allergy pill.  Respiratory: Positive for cough (Persistent since February.) and shortness of breath (As noted above). Negative for sputum production.   Cardiovascular: Positive for chest pain (Worse with coughing.), palpitations (.) and leg swelling (Left greater than right-relatively chronic since her back pain started).  Gastrointestinal: Negative for blood in stool and melena.  Genitourinary: Negative for dysuria and hematuria.  Musculoskeletal: Positive for back pain, joint pain and myalgias. Negative for falls (Almost fell, but did not fall a few weeks ago).  Neurological: Positive for dizziness, weakness (Generalized) and headaches. Negative for focal weakness.  Psychiatric/Behavioral: Positive for depression (Some anhedonia, poor sleeping and poor diet). Negative for memory loss. The patient is nervous/anxious and has insomnia (Not sleeping well.).   All other systems reviewed and are negative.  I have reviewed and (if needed) personally updated the patient's problem list, medications, allergies, past medical and surgical history, social and family history.   Past Medical History:  Diagnosis Date   Degenerative disc disease, lumbar    Hypertension    Thyroid disease    hypothyroidism    Past Surgical History:  Procedure Laterality Date   ABDOMINAL HYSTERECTOMY     ROTATOR CUFF REPAIR  11/30/2015   Right shoulder    Current Meds  Medication Sig   albuterol (VENTOLIN HFA) 108 (90 Base) MCG/ACT  inhaler Inhale 2 puffs into the lungs every 6 (six) hours as needed for wheezing or shortness of breath.   budesonide-formoterol (SYMBICORT) 80-4.5 MCG/ACT inhaler Inhale 2 puffs into the lungs 2 (two) times a day. (rinse mouth after use)   hydrochlorothiazide (HYDRODIURIL) 25 MG tablet Take 1 tablet (25 mg total) by mouth daily.   hydrOXYzine (ATARAX/VISTARIL) 10 MG tablet Take 1 tablet (10 mg total) by mouth 3 (three) times daily as needed for itching.   levothyroxine (SYNTHROID, LEVOTHROID) 75 MCG tablet TAKE 1 TABLET BY MOUTH ONCE DAILY BEFORE BREAKFAST   losartan (COZAAR) 100 MG tablet Take 1 tablet (100 mg total) by mouth daily.   meclizine (ANTIVERT) 25 MG tablet Take 1 tablet (25 mg total) by mouth 3 (three) times daily as needed for dizziness.   meloxicam (MOBIC) 7.5 MG tablet Take 1 tablet (7.5 mg total) by mouth 2 (two) times daily as needed for pain.   methocarbamol (ROBAXIN) 500 MG tablet Take 1 tablet (500 mg total) by mouth 4 (four) times daily as needed for muscle spasms.   omeprazole (  PRILOSEC) 20 MG capsule Take 20 mg by mouth as needed.    Allergies  Allergen Reactions   Anesthesia S-I-40 [Propofol]     Per patient need to be careful when giving this    Social History   Tobacco Use   Smoking status: Never Smoker   Smokeless tobacco: Never Used  Substance Use Topics   Alcohol use: No   Drug use: No   Social History   Social History Narrative   Lives with husband.      family history includes Arthritis in her mother; Asthma in her maternal grandmother and sister; Cancer in her brother; Healthy in her daughter, daughter, daughter, daughter, and daughter; Heart attack in her father and maternal grandmother; Heart disease in her brother and father.  Wt Readings from Last 3 Encounters:  06/23/18 157 lb 6.4 oz (71.4 kg)  06/20/18 157 lb 12.8 oz (71.6 kg)  06/16/18 155 lb (70.3 kg)    PHYSICAL EXAM BP 114/60    Pulse 67    Temp 97.7 F (36.5 C)     Ht 5\' 3"  (1.6 Grant)    Wt 157 lb 6.4 oz (71.4 kg)    SpO2 97%    BMI 27.88 kg/Grant  Physical Exam  Constitutional: She is oriented to person, place, and time. No distress (No acute distress).  Somewhat ill-appearing (chronic) woman who appears older than stated age.  She seems to be just uncomfortable and tired.  HENT:  Head: Normocephalic and atraumatic.  Wearing mask  Eyes: Conjunctivae and EOM are normal.  Neck: Normal range of motion. Neck supple. No JVD present.  Cardiovascular: Normal rate, regular rhythm and intact distal pulses. Exam reveals no gallop and no friction rub.  No murmur heard. Pulmonary/Chest: Breath sounds normal. No respiratory distress. She has no wheezes. She has no rales. She exhibits tenderness (Mild tenderness with palpation; more epigastric).  Abdominal: Soft. Bowel sounds are normal. She exhibits no distension. There is no rebound.  Somewhat firm, but not tympanitic.  Mild tenderness.  Musculoskeletal: Normal range of motion.        General: Edema (Trivial left greater than right puffy swelling.) present.  Neurological: She is alert and oriented to person, place, and time. No cranial nerve deficit.  Skin: Skin is warm and dry.  Psychiatric: She has a normal mood and affect. Her behavior is normal. Judgment and thought content normal.  Somewhat slowed affect  Vitals reviewed.   Adult ECG Report  Rate: 67 ;  Rhythm: normal sinus rhythm and Normal ST of T wave segments.  Normal axis, intervals durations.;   Narrative Interpretation: Normal EKG   Other studies Reviewed: Additional studies/ records that were reviewed today include:  Recent Labs:   No results found for: CHOL, HDL, LDLCALC, LDLDIRECT, TRIG, CHOLHDL Lab Results  Component Value Date   CREATININE 0.73 06/20/2018   BUN 16 06/20/2018   NA 140 06/20/2018   K 3.8 06/20/2018   CL 102 06/20/2018   CO2 26 06/20/2018    ASSESSMENT / PLAN: Problem List Items Addressed This Visit    Chest pain of  uncertain etiology   Relevant Orders   MYOCARDIAL PERFUSION IMAGING   ECHOCARDIOGRAM COMPLETE   Essential hypertension (Chronic)   Relevant Orders   MYOCARDIAL PERFUSION IMAGING   ECHOCARDIOGRAM COMPLETE   Palpitations   Relevant Orders   MYOCARDIAL PERFUSION IMAGING   ECHOCARDIOGRAM COMPLETE   SOB (shortness of breath) - Primary   Relevant Orders   MYOCARDIAL PERFUSION IMAGING  ECHOCARDIOGRAM COMPLETE     Kalissa is here today, pretty much exasperated with her symptoms.  She is having chest discomfort although it does sound to be somewhat atypical in nature, with the amount of her exertional dyspnea, I do think we need to exclude for a cardiac etiology. The best course of action is to proceed with echocardiogram to evaluate EF and for any wall motion normality.  I do not hear any murmurs, but need to exclude wall motion normality and evaluate pulmonary pressures. Although chest discomfort does not sound anginal in nature, her exertional dyspnea does, as such, I think is not unreasonable for sake of completion to evaluate for ischemia.  Based on COVID-19 restrictions, it is probably easier to get a Lexiscan Myoview done sooner than a coronary CT angiogram.  I think being able to evaluate for ischemia would help better exclude a cardiac etiology.  At present, the palpitations do not seem to be all that worrisome, however where they to persist, and other evaluations are negative, we could consider an event monitor.  Would defer to Dr. Percival Spanish in follow-up.  Plan: 2D echocardiogram and Lexiscan Myoview for complete cardiac evaluation. No medication changes for now.  Defer monitor evaluation to Dr. Percival Spanish on return visit.   I spent a total of 31minutes with the patient and chart review. >  50% of the time was spent in direct patient consultation.   Current medicines are reviewed at length with the patient today.  (+/- concerns) none The following changes have been made:   None  Patient Instructions  Medication Instructions:  Your physician recommends that you continue on your current medications as directed. Please refer to the Current Medication list given to you today.  If you need a refill on your cardiac medications before your next appointment, please call your pharmacy.   Lab work: NONE   Testing/Procedures: Your physician has requested that you have an echocardiogram. Echocardiography is a painless test that uses sound waves to create images of your heart. It provides your doctor with information about the size and shape of your heart and how well your hearts chambers and valves are working. This procedure takes approximately one hour. There are no restrictions for this procedure. [place] [address]   Your physician has requested that you have a lexiscan myoview. For further information please visit [email or web address]. Please follow instruction sheet, as given.  Follow-Up: At [facility], you and your health needs are our priority.  As part of our continuing mission to provide you with exceptional heart care, we have created designated Provider Care Teams.  These Care Teams include your primary Cardiologist (physician) and Advanced Practice Providers (APPs -  Physician Assistants and Nurse Practitioners) who all work together to provide you with the care you need, when you need it.  WITH DR [facility] AFTER TESTING COMPLETED   THE OFFICE WILL CALL YOU TO SCHEDULE YOUR APPOINTMENTS  Studies Ordered:   Orders Placed This Encounter  Procedures   MYOCARDIAL PERFUSION IMAGING   ECHOCARDIOGRAM COMPLETE      Diane Grant, Grant.D., Grant.S. Interventional Cardiologist   Pager # [phone] Phone # [phone] [address] [place], [place] [address]   Thank you for choosing Heartcare at [facility]!!

## 2018-06-24 ENCOUNTER — Encounter: Payer: Self-pay | Admitting: Cardiology

## 2018-06-24 NOTE — Addendum Note (Signed)
Addended by: Zebedee Iba on: 06/24/2018 03:12 PM   Modules accepted: Orders

## 2018-06-25 ENCOUNTER — Telehealth: Payer: Self-pay | Admitting: Cardiovascular Disease

## 2018-06-25 NOTE — Telephone Encounter (Signed)
Error

## 2018-06-26 ENCOUNTER — Telehealth: Payer: Self-pay

## 2018-06-26 NOTE — Telephone Encounter (Signed)
Contacted by pt daughter, Jeani Hawking, on DPR to discuss options as she has not heard back from Pulmonology and is worried about doing the testing ordered by Dr. Ellyn Hack on Monday visit.  Pulmonology office recommended an asthma test but they are not doing those right now because of COVID.  Encouraged her to stay in contact with Pulmonology office get ashtma test completed. In addition, to get the Echo and Lexi scheduled with our office to definitively rule out cardiology, recommended getting these done before her visit with Dr. Percival Spanish at the end of June. Verbalized understanding no additional questions at this time.

## 2018-07-02 ENCOUNTER — Telehealth: Payer: Medicare Other | Admitting: Cardiology

## 2018-07-03 ENCOUNTER — Telehealth: Payer: Self-pay

## 2018-07-03 ENCOUNTER — Telehealth: Payer: Self-pay | Admitting: Pulmonary Disease

## 2018-07-03 DIAGNOSIS — R0602 Shortness of breath: Secondary | ICD-10-CM

## 2018-07-03 MED ORDER — PREDNISONE 10 MG PO TABS: ORAL_TABLET | ORAL | 0 refills | Status: DC

## 2018-07-03 NOTE — Telephone Encounter (Signed)
Pt saw Dr. Vaughan Browner on 5/15 she has been evaluated new consult.  She still has sob. Sx are about the same.  Per his evaluation a short course of prednisone was offered and patient wanted to wait. She has decided she is ready now for the prednisone. Pt pharmacy [facility] [place].  Assessment:  Evaluation for dyspnea Low suspicion for COPD as she is a non-smoker.  It is possible that she may have asthma due to history of allergies and family history of asthma but no wheezing noted on examination today She is currently on Symbicort inhaler.  Unfortunately we are unable to do PFTs or FENO for the time being due to COVID restrictions on aerosolizing procedures. Discussed empiric short course of prednisone to see if it will help with her symptoms but she prefers to wait Agree with cardiology evaluation We will check some labs today including CBC with differential to evaluate peripheral eosinophilia and IgE.  We will also check TSH, BNP and troponin  Follow-up in 1 to 2 weeks Advised her to seek care at the emergency room if there is worsening of symptoms.

## 2018-07-03 NOTE — Telephone Encounter (Signed)
Okay can offer:Prednisone 10mg  tablet  >>>4 tabs for 2 days, then 3 tabs for 2 days, 2 tabs for 2 days, then 1 tab for 2 days, then stop >>>take with food  >>>take in the morning    Please place the order.If patient symptoms do not improve or symptoms worsen she needs to contact our office.  If patient shortness of breath worsens acutely she will need to present to the emergency room.  Wyn Quaker NP

## 2018-07-03 NOTE — Telephone Encounter (Signed)
New message   Call the patient today per active request from Dr. Ellyn Hack to set up an echo and nuclear test.   The patient decline test at this time, the patient voiced " she will not be doing the test".

## 2018-07-03 NOTE — Telephone Encounter (Signed)
Called pt and advised message from the provider. Pt understood and verbalized understanding. Nothing further is needed.   Rx sent to the pharmacy.  

## 2018-07-08 ENCOUNTER — Other Ambulatory Visit: Payer: Self-pay | Admitting: Family Medicine

## 2018-07-14 ENCOUNTER — Ambulatory Visit: Payer: Medicare Other | Admitting: *Deleted

## 2018-07-14 NOTE — Telephone Encounter (Signed)
ATC patient.  Left detailed message for Patient to call and schedule a my chart video visit with Dr Vaughan Browner later this month.

## 2018-07-14 NOTE — Telephone Encounter (Signed)
Please make follow up appointment with me this month. Video visit preferred

## 2018-07-15 NOTE — Telephone Encounter (Signed)
ATC Patient. Phone rings, no answer, no VM. Unable to leave message today.  Will try again at a later time.  Per Dr Vaughan Browner- Note    Please make follow up appointment with me this month. Video visit preferred

## 2018-07-16 NOTE — Telephone Encounter (Signed)
Called and spoke with Patient.  Patient stated she does not have a tablet or smart phone. Offered in office visit. Patient stated she was going to speak with her Daughter about possible video visit.  Patient stated she would call office back. Explained that if she is unable to do video visit, she can get regular office visit scheduled for follow up with Dr. Vaughan Browner. Will leave message open for follow up.

## 2018-07-18 NOTE — Telephone Encounter (Signed)
INFORMATION RECEIVED  AND PLACED ON NOTES  FOR UPCOMING FOLLOW UP VISIT WITH DR Percival Spanish

## 2018-07-22 NOTE — Telephone Encounter (Signed)
Called and spoke with Diane Grant.  Dr Matilde Bash recommendations given to Diane Grant.  Diane Grant stated she does not want a follow up visit or video visit.  Diane Grant stated at last OV, PFT was discussed,and that is what she is requesting.  Explained that PFT's are being done, but only 2 per day.  Explained PFT's are backed up at this time.   Diane Grant does not have PFT ordered.  Message routed to Dr Vaughan Browner

## 2018-07-25 NOTE — Telephone Encounter (Signed)
PFT has been ordered. Will send to [facility] to schedule PFT appt.

## 2018-07-25 NOTE — Telephone Encounter (Signed)
Ok to order PFTs and put on list for scheduling.

## 2018-07-25 NOTE — Addendum Note (Signed)
Addended by: Collier Salina on: 07/25/2018 03:37 PM   Modules accepted: Orders

## 2018-08-04 ENCOUNTER — Ambulatory Visit: Payer: Medicare Other | Admitting: Cardiology

## 2018-08-06 ENCOUNTER — Telehealth: Payer: Medicare Other | Admitting: Cardiology

## 2018-08-07 NOTE — Telephone Encounter (Signed)
Called and scheduled patient for PFT on 09/17/2018 -pr

## 2018-08-11 ENCOUNTER — Ambulatory Visit (INDEPENDENT_AMBULATORY_CARE_PROVIDER_SITE_OTHER): Payer: Medicare Other | Admitting: Family

## 2018-08-11 ENCOUNTER — Telehealth: Payer: Self-pay | Admitting: *Deleted

## 2018-08-11 ENCOUNTER — Other Ambulatory Visit: Payer: Self-pay

## 2018-08-11 ENCOUNTER — Other Ambulatory Visit: Payer: Medicare Other

## 2018-08-11 ENCOUNTER — Encounter: Payer: Self-pay | Admitting: Family

## 2018-08-11 DIAGNOSIS — Z20822 Contact with and (suspected) exposure to covid-19: Secondary | ICD-10-CM

## 2018-08-11 DIAGNOSIS — R059 Cough, unspecified: Secondary | ICD-10-CM

## 2018-08-11 DIAGNOSIS — Z20828 Contact with and (suspected) exposure to other viral communicable diseases: Secondary | ICD-10-CM | POA: Diagnosis not present

## 2018-08-11 DIAGNOSIS — R05 Cough: Secondary | ICD-10-CM | POA: Diagnosis not present

## 2018-08-11 DIAGNOSIS — R6889 Other general symptoms and signs: Secondary | ICD-10-CM | POA: Diagnosis not present

## 2018-08-11 NOTE — Telephone Encounter (Signed)
Scheduled patient for COVID 19 test today at 11:45 am at [facility].  Testing protocol reviewed with patient.

## 2018-08-11 NOTE — Progress Notes (Signed)
   Virtual Visit via telephone Note  I connected with Diane Grant on 08/11/18 at 10:02 AM by telephone and verified that I am speaking with the correct person using two identifiers. Diane Grant is currently located at home and husband is currently with her during visit. The provider, Evelina Dun, FNP is located in their office at time of visit.  I discussed the limitations, risks, security and privacy concerns of performing an evaluation and management service by telephone and the availability of in person appointments. I also discussed with the patient that there may be a patient responsible charge related to this service. The patient expressed understanding and agreed to proceed.   History and Present Illness:  Diane Grant calls the office today to discuss COVID. She states she went to a family dinner this Saturday with Granddaughter. Today her granddaughter was told that someone she works with tested positive for COVID 19.   Diane Grant states she does have an intermittent cough that starts after she goes outside. She states her husband had COPD and worries about him getting COVID 19 and would like to be tested.  Cough This is a new problem. The current episode started in the past 7 days. The problem occurs every few minutes. The cough is non-productive. Associated symptoms include rhinorrhea. Pertinent negatives include no chills, ear congestion, ear pain, fever, nasal congestion, postnasal drip, shortness of breath, sweats or wheezing. The symptoms are aggravated by pollens.      Review of Systems  Constitutional: Negative for chills and fever.  HENT: Positive for rhinorrhea. Negative for ear pain and postnasal drip.   Respiratory: Positive for cough. Negative for shortness of breath and wheezing.      Observations/Objective: No SOB or distress noted  Assessment and Plan: Diane Grant comes in today with chief complaint of No chief complaint on file.   Diagnosis and orders  addressed:  1. Exposure to Covid-19 Virus  2. Cough  Will route note to our [employer] site Continue self quarantine until results return Rest Force fluids Tylenol as needed       I discussed the assessment and treatment plan with the patient. The patient was provided an opportunity to ask questions and all were answered. The patient agreed with the plan and demonstrated an understanding of the instructions.   The patient was advised to call back or seek an in-person evaluation if the symptoms worsen or if the condition fails to improve as anticipated.  The above assessment and management plan was discussed with the patient. The patient verbalized understanding of and has agreed to the management plan. Patient is aware to call the clinic if symptoms persist or worsen. Patient is aware when to return to the clinic for a follow-up visit. Patient educated on when it is appropriate to go to the emergency department.   Time call ended:  10:12 AM  I provided 10 minutes of non-face-to-face time during this encounter.    Evelina Dun, FNP

## 2018-08-16 LAB — NOVEL CORONAVIRUS, NAA: SARS-CoV-2, NAA: NOT DETECTED

## 2018-08-19 ENCOUNTER — Telehealth: Payer: Self-pay | Admitting: *Deleted

## 2018-08-19 MED ORDER — LEVOTHYROXINE SODIUM 75 MCG PO TABS: ORAL_TABLET | ORAL | 3 refills | Status: DC

## 2018-08-19 MED ORDER — LOSARTAN POTASSIUM 100 MG PO TABS: 100.0000 mg | ORAL_TABLET | Freq: Every day | ORAL | 3 refills | Status: DC

## 2018-08-19 MED ORDER — HYDROCHLOROTHIAZIDE 25 MG PO TABS: 25.0000 mg | ORAL_TABLET | Freq: Every day | ORAL | 3 refills | Status: DC

## 2018-08-19 NOTE — Telephone Encounter (Signed)
Rx sent to pharmacy per patient request.  Patient aware

## 2018-09-03 ENCOUNTER — Encounter: Payer: Self-pay | Admitting: Family

## 2018-09-03 ENCOUNTER — Other Ambulatory Visit: Payer: Self-pay

## 2018-09-03 ENCOUNTER — Ambulatory Visit (INDEPENDENT_AMBULATORY_CARE_PROVIDER_SITE_OTHER): Payer: Medicare Other | Admitting: Family

## 2018-09-03 ENCOUNTER — Other Ambulatory Visit: Payer: Self-pay | Admitting: Pulmonary Disease

## 2018-09-03 DIAGNOSIS — R1032 Left lower quadrant pain: Secondary | ICD-10-CM

## 2018-09-03 DIAGNOSIS — K5792 Diverticulitis of intestine, part unspecified, without perforation or abscess without bleeding: Secondary | ICD-10-CM | POA: Diagnosis not present

## 2018-09-03 MED ORDER — METRONIDAZOLE 500 MG PO TABS: 500.0000 mg | ORAL_TABLET | Freq: Three times a day (TID) | ORAL | 0 refills | Status: DC

## 2018-09-03 MED ORDER — CIPROFLOXACIN HCL 500 MG PO TABS: 500.0000 mg | ORAL_TABLET | Freq: Two times a day (BID) | ORAL | 0 refills | Status: DC

## 2018-09-03 NOTE — Progress Notes (Signed)
Virtual Visit via telephone Note Due to COVID-19 pandemic this visit was conducted virtually. This visit type was conducted due to national recommendations for restrictions regarding the COVID-19 Pandemic (e.g. social distancing, sheltering in place) in an effort to limit this patient's exposure and mitigate transmission in our community. All issues noted in this document were discussed and addressed.  A physical exam was not performed with this format.  I connected with Diane Grant on 09/03/18 at 4:41 pm by telephone and verified that I am speaking with the correct person using two identifiers. Diane Grant is currently located at home and no one is currently with her during visit. The provider, Evelina Dun, FNP is located in their office at time of visit.  I discussed the limitations, risks, security and privacy concerns of performing an evaluation and management service by telephone and the availability of in person appointments. I also discussed with the patient that there may be a patient responsible charge related to this service. The patient expressed understanding and agreed to proceed.   History and Present Illness:  Abdominal Pain This is a new problem. The current episode started 1 to 4 weeks ago. The onset quality is gradual. The problem occurs constantly. The problem has been gradually worsening. The pain is located in the LLQ. The pain is at a severity of 3/10. The pain is moderate. The quality of the pain is dull. The abdominal pain radiates to the left flank. Associated symptoms include belching, constipation and flatus. Pertinent negatives include no diarrhea, dysuria, fever, frequency, hematochezia, hematuria, myalgias, nausea or vomiting. The pain is aggravated by certain positions. The pain is relieved by movement and palpation. She has tried nothing for the symptoms. The treatment provided no relief.      Review of Systems  Constitutional: Negative for fever.   Gastrointestinal: Positive for abdominal pain, constipation and flatus. Negative for diarrhea, hematochezia, nausea and vomiting.  Genitourinary: Negative for dysuria, frequency and hematuria.  Musculoskeletal: Negative for myalgias.  All other systems reviewed and are negative.    Observations/Objective: No SOB or distress   Assessment and Plan: 1. Left lower quadrant abdominal pain - CT Abdomen Pelvis W Contrast; Future  2. Diverticulitis Rest Clear liquid diet If abdominal pain worsens go to ED CT scan pending RTO if symptoms worsen or do not improve  - ciprofloxacin (CIPRO) 500 MG tablet; Take 1 tablet (500 mg total) by mouth 2 (two) times daily.  Dispense: 14 tablet; Refill: 0 - metroNIDAZOLE (FLAGYL) 500 MG tablet; Take 1 tablet (500 mg total) by mouth 3 (three) times daily.  Dispense: 21 tablet; Refill: 0 - CT Abdomen Pelvis W Contrast; Future      I discussed the assessment and treatment plan with the patient. The patient was provided an opportunity to ask questions and all were answered. The patient agreed with the plan and demonstrated an understanding of the instructions.   The patient was advised to call back or seek an in-person evaluation if the symptoms worsen or if the condition fails to improve as anticipated.  The above assessment and management plan was discussed with the patient. The patient verbalized understanding of and has agreed to the management plan. Patient is aware to call the clinic if symptoms persist or worsen. Patient is aware when to return to the clinic for a follow-up visit. Patient educated on when it is appropriate to go to the emergency department.   Time call ended:  4:55 pm  I provided 14  minutes of non-face-to-face time during this encounter.    Evelina Dun, FNP

## 2018-09-05 ENCOUNTER — Ambulatory Visit (HOSPITAL_COMMUNITY)
Admission: RE | Admit: 2018-09-05 | Discharge: 2018-09-05 | Disposition: A | Discharge status: Home / Self Care | Payer: Medicare Other | Source: Ambulatory Visit | Attending: Family | Admitting: Family

## 2018-09-05 ENCOUNTER — Other Ambulatory Visit: Payer: Self-pay

## 2018-09-05 DIAGNOSIS — R1032 Left lower quadrant pain: Secondary | ICD-10-CM | POA: Diagnosis not present

## 2018-09-05 DIAGNOSIS — K5792 Diverticulitis of intestine, part unspecified, without perforation or abscess without bleeding: Secondary | ICD-10-CM

## 2018-09-05 DIAGNOSIS — K5732 Diverticulitis of large intestine without perforation or abscess without bleeding: Secondary | ICD-10-CM | POA: Diagnosis not present

## 2018-09-05 LAB — POCT I-STAT CREATININE: Creatinine, Ser: 0.7 mg/dL (ref 0.44–1.00)

## 2018-09-05 MED ORDER — IOHEXOL 300 MG/ML  SOLN: 100.0000 mL | Freq: Once | INTRAMUSCULAR | Status: DC | PRN

## 2018-09-05 MED ORDER — IOHEXOL 300 MG/ML  SOLN
100.0000 mL | Freq: Once | INTRAMUSCULAR | Status: AC | PRN
  Administered 2018-09-05: 100 mL via INTRAVENOUS

## 2018-09-08 ENCOUNTER — Other Ambulatory Visit: Payer: Self-pay | Admitting: Family

## 2018-09-08 ENCOUNTER — Telehealth: Payer: Self-pay | Admitting: Family Medicine

## 2018-09-08 DIAGNOSIS — K5792 Diverticulitis of intestine, part unspecified, without perforation or abscess without bleeding: Secondary | ICD-10-CM

## 2018-09-08 NOTE — Telephone Encounter (Signed)
Patient aware.

## 2018-09-09 ENCOUNTER — Emergency Department (HOSPITAL_COMMUNITY)
Admission: EM | Admit: 2018-09-09 | Discharge: 2018-09-09 | Disposition: A | Discharge status: Home / Self Care | Payer: Medicare Other | Attending: Emergency Medicine | Admitting: Emergency Medicine

## 2018-09-09 ENCOUNTER — Other Ambulatory Visit: Payer: Self-pay

## 2018-09-09 ENCOUNTER — Encounter: Payer: Self-pay | Admitting: Internal Medicine

## 2018-09-09 ENCOUNTER — Encounter (HOSPITAL_COMMUNITY): Payer: Self-pay

## 2018-09-09 ENCOUNTER — Encounter: Payer: Self-pay | Admitting: Family Medicine

## 2018-09-09 ENCOUNTER — Emergency Department (HOSPITAL_COMMUNITY): Payer: Medicare Other

## 2018-09-09 ENCOUNTER — Telehealth: Payer: Self-pay | Admitting: Family Medicine

## 2018-09-09 ENCOUNTER — Ambulatory Visit (INDEPENDENT_AMBULATORY_CARE_PROVIDER_SITE_OTHER): Payer: Medicare Other | Admitting: Family Medicine

## 2018-09-09 DIAGNOSIS — I10 Essential (primary) hypertension: Secondary | ICD-10-CM | POA: Insufficient documentation

## 2018-09-09 DIAGNOSIS — K5792 Diverticulitis of intestine, part unspecified, without perforation or abscess without bleeding: Secondary | ICD-10-CM

## 2018-09-09 DIAGNOSIS — Z79899 Other long term (current) drug therapy: Secondary | ICD-10-CM | POA: Insufficient documentation

## 2018-09-09 DIAGNOSIS — R1032 Left lower quadrant pain: Secondary | ICD-10-CM | POA: Diagnosis not present

## 2018-09-09 DIAGNOSIS — R9389 Abnormal findings on diagnostic imaging of other specified body structures: Secondary | ICD-10-CM | POA: Diagnosis not present

## 2018-09-09 DIAGNOSIS — R197 Diarrhea, unspecified: Secondary | ICD-10-CM

## 2018-09-09 DIAGNOSIS — E039 Hypothyroidism, unspecified: Secondary | ICD-10-CM | POA: Insufficient documentation

## 2018-09-09 DIAGNOSIS — K5732 Diverticulitis of large intestine without perforation or abscess without bleeding: Secondary | ICD-10-CM | POA: Insufficient documentation

## 2018-09-09 DIAGNOSIS — R1110 Vomiting, unspecified: Secondary | ICD-10-CM | POA: Diagnosis not present

## 2018-09-09 LAB — COMPREHENSIVE METABOLIC PANEL
ALT: 37 U/L (ref 0–44)
AST: 39 U/L (ref 15–41)
Albumin: 4.6 g/dL (ref 3.5–5.0)
Alkaline Phosphatase: 66 U/L (ref 38–126)
Anion gap: 12 (ref 5–15)
BUN: 19 mg/dL (ref 8–23)
CO2: 25 mmol/L (ref 22–32)
Calcium: 9.6 mg/dL (ref 8.9–10.3)
Chloride: 103 mmol/L (ref 98–111)
Creatinine, Ser: 0.84 mg/dL (ref 0.44–1.00)
GFR calc Af Amer: >60 mL/min (ref >60)
GFR calc non Af Amer: >60 mL/min (ref >60)
Glucose, Bld: 97 mg/dL (ref 70–99)
Potassium: 3.3 mmol/L — ABNORMAL LOW (ref 3.5–5.1)
Sodium: 140 mmol/L (ref 135–145)
Total Bilirubin: 0.6 mg/dL (ref 0.3–1.2)
Total Protein: 8.2 g/dL — ABNORMAL HIGH (ref 6.5–8.1)

## 2018-09-09 LAB — CBC
HCT: 44.3 % (ref 36.0–46.0)
Hemoglobin: 14.6 g/dL (ref 12.0–15.0)
MCH: 30.5 pg (ref 26.0–34.0)
MCHC: 33 g/dL (ref 30.0–36.0)
MCV: 92.7 fL (ref 80.0–100.0)
Platelets: 347 10*3/uL (ref 150–400)
RBC: 4.78 MIL/uL (ref 3.87–5.11)
RDW: 12.1 % (ref 11.5–15.5)
WBC: 10.7 10*3/uL — ABNORMAL HIGH (ref 4.0–10.5)
nRBC: 0 % (ref 0.0–0.2)

## 2018-09-09 LAB — LACTIC ACID, PLASMA: Lactic Acid, Venous: 0.9 mmol/L (ref 0.5–1.9)

## 2018-09-09 LAB — URINALYSIS, ROUTINE W REFLEX MICROSCOPIC
Bilirubin Urine: NEGATIVE
Glucose, UA: NEGATIVE mg/dL
Hgb urine dipstick: NEGATIVE
Ketones, ur: NEGATIVE mg/dL
Leukocytes,Ua: NEGATIVE
Nitrite: NEGATIVE
Protein, ur: NEGATIVE mg/dL
Specific Gravity, Urine: 1.008 (ref 1.005–1.030)
pH: 6 (ref 5.0–8.0)

## 2018-09-09 LAB — LIPASE, BLOOD: Lipase: 34 U/L (ref 11–51)

## 2018-09-09 MED ORDER — MORPHINE SULFATE (PF) 4 MG/ML IV SOLN
4.0000 mg | Freq: Once | INTRAVENOUS | Status: AC
  Administered 2018-09-09: 22:00:00 4 mg via INTRAVENOUS
  Filled 2018-09-09: qty 1

## 2018-09-09 MED ORDER — ONDANSETRON HCL 4 MG/2ML IJ SOLN
4.0000 mg | Freq: Once | INTRAMUSCULAR | Status: AC
  Administered 2018-09-09: 4 mg via INTRAVENOUS
  Filled 2018-09-09: qty 2

## 2018-09-09 MED ORDER — IOHEXOL 300 MG/ML  SOLN
100.0000 mL | Freq: Once | INTRAMUSCULAR | Status: AC | PRN
  Administered 2018-09-09: 23:00:00 100 mL via INTRAVENOUS

## 2018-09-09 MED ORDER — PIPERACILLIN-TAZOBACTAM 3.375 G IVPB 30 MIN
3.3750 g | Freq: Once | INTRAVENOUS | Status: AC
  Administered 2018-09-09: 22:00:00 3.375 g via INTRAVENOUS
  Filled 2018-09-09: qty 50

## 2018-09-09 MED ORDER — SODIUM CHLORIDE (PF) 0.9 % IJ SOLN
INTRAMUSCULAR | Status: AC
  Filled 2018-09-09: qty 50

## 2018-09-09 MED ORDER — AMOXICILLIN-POT CLAVULANATE 875-125 MG PO TABS: 1.0000 | ORAL_TABLET | Freq: Two times a day (BID) | ORAL | 0 refills | Status: DC

## 2018-09-09 MED ORDER — SODIUM CHLORIDE 0.9 % IV BOLUS
1000.0000 mL | Freq: Once | INTRAVENOUS | Status: AC
  Administered 2018-09-09: 1000 mL via INTRAVENOUS

## 2018-09-09 MED ORDER — SODIUM CHLORIDE 0.9% FLUSH: 3.0000 mL | Freq: Once | INTRAVENOUS | Status: DC

## 2018-09-09 NOTE — Progress Notes (Signed)
Virtual Visit via telephone Note Due to COVID-19 pandemic this visit was conducted virtually. This visit type was conducted due to national recommendations for restrictions regarding the COVID-19 Pandemic (e.g. social distancing, sheltering in place) in an effort to limit this patient's exposure and mitigate transmission in our community. All issues noted in this document were discussed and addressed.  A physical exam was not performed with this format.   I connected with Diane Grant on 09/09/18 at 1415 by telephone and verified that I am speaking with the correct person using two identifiers. Diane Grant is currently located at home and no one is currently with them during visit. The provider, Monia Pouch, FNP is located in their office at time of visit.  I discussed the limitations, risks, security and privacy concerns of performing an evaluation and management service by telephone and the availability of in person appointments. I also discussed with the patient that there may be a patient responsible charge related to this service. The patient expressed understanding and agreed to proceed.  Subjective:  Patient ID: Diane Grant, female    DOB: 03-23-1950, 68 y.o.   MRN: [id number]  Chief Complaint:  Abdominal Pain, Diarrhea, and Other (Abnormal Abdominal CT)   HPI: Diane Grant is a 68 y.o. female presenting on 09/09/2018 for Abdominal Pain, Diarrhea, and Other (Abnormal Abdominal CT)   Pt was evaluated on 09/03/2018 for LLQ abdominal pain and diagnosed with diverticulitis. CT scan was ordered at this time and pt was placed on empiric Cirpo and Flagyl. Pt had a CT of the abdomen and pelvis completed on 09/05/2018:   IMPRESSION: 1. There is descending and sigmoid diverticulosis. There is focal, eccentric wall thickening about the superior aspect of the proximal sigmoid colon (series 5, image 28) with mild adjacent fat stranding. There may be a 9 mm intramural  phlegmon or abscess at this site. Underlying malignancy is not excluded. Consider colonoscopy at the resolution of acute clinical symptoms. 2.  Aortic atherosclerosis. 3.  Status post hysterectomy.  Pt states she was told she would be referred to a GI doctor, the referral has been placed. Pt states she took the antibiotics for 2 days and had continuous watery diarrhea so she stopped taking them. States the diarrhea stopped when she stopped taking the antibiotics. Pt states she continues to have LLQ abdominal pain. Pt would like different antibiotics for treatment. Pt denies melena, hematochezia, fever, chills, weakness, or fatigue.   Abdominal Pain This is a recurrent problem. The current episode started 1 to 4 weeks ago. The problem occurs daily. The problem has been gradually worsening. The pain is located in the LLQ. The pain is at a severity of 5/10. The pain is moderate. The quality of the pain is cramping, aching and dull. Associated symptoms include belching, diarrhea and flatus. Pertinent negatives include no anorexia, arthralgias, constipation, dysuria, fever, frequency, headaches, hematochezia, hematuria, melena, myalgias, nausea, vomiting or weight loss. She has tried antibiotics for the symptoms. The treatment provided no relief. Prior diagnostic workup includes CT scan.  Diarrhea  Associated symptoms include abdominal pain and increased flatus. Pertinent negatives include no arthralgias, chills, coughing, fever, headaches, myalgias, vomiting or weight loss.     Relevant past medical, surgical, family, and social history reviewed and updated as indicated.  Allergies and medications reviewed and updated.   Past Medical History:  Diagnosis Date   Degenerative disc disease, lumbar    Hypertension    Thyroid disease    hypothyroidism  Past Surgical History:  Procedure Laterality Date   ABDOMINAL HYSTERECTOMY     ROTATOR CUFF REPAIR  11/30/2015   Right shoulder     Social History   Socioeconomic History   Marital status: Married    Spouse name: Jeneen Rinks   Number of children: 5   Years of education: 12   Highest education level: High school graduate  Occupational History   Not on file  Social Needs   Financial resource strain: Not hard at all   Food insecurity    Worry: Never true    Inability: Never true   Transportation needs    Medical: No    Non-medical: No  Tobacco Use   Smoking status: Never Smoker   Smokeless tobacco: Never Used  Substance and Sexual Activity   Alcohol use: No   Drug use: No   Sexual activity: Not on file  Lifestyle   Physical activity    Days per week: 3 days    Minutes per session: 30 min   Stress: Not at all  Relationships   Social connections    Talks on phone: More than three times a week    Gets together: More than three times a week    Attends religious service: More than 4 times per year    Active member of club or organization: Yes    Attends meetings of clubs or organizations: More than 4 times per year    Relationship status: Married   Intimate partner violence    Fear of current or ex partner: No    Emotionally abused: No    Physically abused: No    Forced sexual activity: No  Other Topics Concern   Not on file  Social History Narrative   Lives with husband.      Outpatient Encounter Medications as of 09/09/2018  Medication Sig   albuterol (VENTOLIN HFA) 108 (90 Base) MCG/ACT inhaler Inhale 2 puffs into the lungs every 6 (six) hours as needed for wheezing or shortness of breath.   budesonide-formoterol (SYMBICORT) 80-4.5 MCG/ACT inhaler Inhale 2 puffs into the lungs 2 (two) times a day. (rinse mouth after use)   ciprofloxacin (CIPRO) 500 MG tablet Take 1 tablet (500 mg total) by mouth 2 (two) times daily.   hydrochlorothiazide (HYDRODIURIL) 25 MG tablet Take 1 tablet (25 mg total) by mouth daily.   hydrOXYzine (ATARAX/VISTARIL) 10 MG tablet Take 1 tablet (10 mg  total) by mouth 3 (three) times daily as needed for itching.   levothyroxine (EUTHYROX) 75 MCG tablet TAKE 1 TABLET BY MOUTH ONCE DAILY BEFORE BREAKFAST   losartan (COZAAR) 100 MG tablet Take 1 tablet (100 mg total) by mouth daily.   meclizine (ANTIVERT) 25 MG tablet Take 1 tablet (25 mg total) by mouth 3 (three) times daily as needed for dizziness.   meloxicam (MOBIC) 7.5 MG tablet Take 1 tablet (7.5 mg total) by mouth 2 (two) times daily as needed for pain.   methocarbamol (ROBAXIN) 500 MG tablet Take 1 tablet (500 mg total) by mouth 4 (four) times daily as needed for muscle spasms.   metroNIDAZOLE (FLAGYL) 500 MG tablet Take 1 tablet (500 mg total) by mouth 3 (three) times daily.   omeprazole (PRILOSEC) 20 MG capsule Take 20 mg by mouth as needed.   No facility-administered encounter medications on file as of 09/09/2018.     Allergies  Allergen Reactions   Anesthesia S-[id number] [Propofol]     Per patient need to be careful when giving this  Review of Systems  Constitutional: Negative for activity change, chills, fatigue, fever, unexpected weight change and weight loss.  Respiratory: Negative for cough, chest tightness and shortness of breath.   Cardiovascular: Negative for chest pain, palpitations and leg swelling.  Gastrointestinal: Positive for abdominal pain, diarrhea and flatus. Negative for abdominal distention, anal bleeding, anorexia, blood in stool, constipation, hematochezia, melena, nausea, rectal pain and vomiting.  Genitourinary: Negative for dysuria, frequency and hematuria.  Musculoskeletal: Negative for arthralgias and myalgias.  Neurological: Negative for dizziness, syncope, weakness, light-headedness and headaches.  Psychiatric/Behavioral: Negative for confusion.  All other systems reviewed and are negative.        Observations/Objective: No vital signs or physical exam, this was a telephone or virtual health encounter.  Pt alert and oriented, answers all  questions appropriately, and able to speak in full sentences.    Assessment and Plan: Kelce was seen today for abdominal pain, diarrhea and other.  Diagnoses and all orders for this visit:  LLQ abdominal pain Diarrhea in adult patient Abnormal CT scan Pt unable to tolerate oral antibiotics due to diarrhea so she stopped them. Pt had an abnormal abdominal CT, concerning for possible malignancy or abscess of the sigmoid colon. Pt has continued LLQ pain. Pt advised she should go to the ED for evaluation, treatment, and possible admission due to failed outpatient therapy. Pt reluctant to go but agrees to plan.   Discussed case with pts PCP, Dr. Lajuana Ripple, who agrees with plan also.   Follow Up Instructions: Pt to go to ED for evaluation and treatment.    I discussed the assessment and treatment plan with the patient. The patient was provided an opportunity to ask questions and all were answered. The patient agreed with the plan and demonstrated an understanding of the instructions.   The patient was advised to call back or seek an in-person evaluation if the symptoms worsen or if the condition fails to improve as anticipated.  The above assessment and management plan was discussed with the patient. The patient verbalized understanding of and has agreed to the management plan. Patient is aware to call the clinic if symptoms persist or worsen. Patient is aware when to return to the clinic for a follow-up visit. Patient educated on when it is appropriate to go to the emergency department.    I provided 25 minutes of non-face-to-face time during this encounter. The call started at 1415. The call ended at 1445. The other time was used for coordination of care.    Monia Pouch, FNP-C [facility] [address] [place], [place] [address] [phone] 09/09/18

## 2018-09-09 NOTE — ED Provider Notes (Signed)
[facility] DEPT Provider Note   CSN: [id number] Arrival date & time: 09/09/18  1625    History   Chief Complaint Chief Complaint  Patient presents with  . Abdominal Pain    HPI Diane Grant is a 68 y.o. female history of hypertension, hypothyroidism here presenting with left lower quadrant pain.  Patient had left lower quadrant pain for the last 2 weeks.  Patient had CT about 5 days ago that showed a diverticular abscess versus phlegmon.  She was prescribed Cipro and Flagyl but took a dose and then had severe diarrhea and cramps.  She then stopped taking the antibiotics.  She has worsening pain and was sent by her doctor for evaluation.      The history is provided by the patient.    Past Medical History:  Diagnosis Date  . Degenerative disc disease, lumbar   . Hypertension   . Thyroid disease    hypothyroidism    Patient Active Problem List   Diagnosis Date Noted  . Palpitations 06/16/2018  . Chest pain of uncertain etiology 85/88/5027  . SOB (shortness of breath) 06/16/2018  . Educated About Covid-19 Virus Infection 06/16/2018  . Change in voice 03/08/2017  . Degenerative disc disease, lumbar 03/08/2017  . Disorder of thyroid 06/26/2013  . Essential hypertension 06/26/2013    Past Surgical History:  Procedure Laterality Date  . ABDOMINAL HYSTERECTOMY    . ROTATOR CUFF REPAIR  11/30/2015   Right shoulder     OB History   No obstetric history on file.      Home Medications    Prior to Admission medications   Medication Sig Start Date End Date Taking? Authorizing Provider  albuterol (VENTOLIN HFA) 108 (90 Base) MCG/ACT inhaler Inhale 2 puffs into the lungs every 6 (six) hours as needed for wheezing or shortness of breath. 05/24/18  Yes Gottschalk, Leatrice Jewels M, DO  hydrochlorothiazide (HYDRODIURIL) 25 MG tablet Take 1 tablet (25 mg total) by mouth daily. 08/19/18  Yes Ronnie Doss M, DO  hydrOXYzine (ATARAX/VISTARIL) 10 MG  tablet Take 1 tablet (10 mg total) by mouth 3 (three) times daily as needed for itching. 03/18/18  Yes Terald Sleeper, PA-C  levothyroxine (EUTHYROX) 75 MCG tablet TAKE 1 TABLET BY MOUTH ONCE DAILY BEFORE BREAKFAST 08/19/18  Yes Gottschalk, Ashly M, DO  losartan (COZAAR) 100 MG tablet Take 1 tablet (100 mg total) by mouth daily. 08/19/18  Yes Gottschalk, Leatrice Jewels M, DO  meclizine (ANTIVERT) 25 MG tablet Take 1 tablet (25 mg total) by mouth 3 (three) times daily as needed for dizziness. 03/17/18  Yes Terald Sleeper, PA-C  methocarbamol (ROBAXIN) 500 MG tablet Take 1 tablet (500 mg total) by mouth 4 (four) times daily as needed for muscle spasms. 07/25/17  Yes Gottschalk, Ashly M, DO  budesonide-formoterol (SYMBICORT) 80-4.5 MCG/ACT inhaler Inhale 2 puffs into the lungs 2 (two) times a day. (rinse mouth after use) Patient not taking: Reported on 09/09/2018 05/24/18   [name], DO  ciprofloxacin (CIPRO) 500 MG tablet Take 1 tablet (500 mg total) by mouth 2 (two) times daily. Patient not taking: Reported on 09/09/2018 09/03/18   Evelina Dun A, FNP  meloxicam (MOBIC) 7.5 MG tablet Take 1 tablet (7.5 mg total) by mouth 2 (two) times daily as needed for pain. Patient not taking: Reported on 09/09/2018 12/02/17   [name], DO  metroNIDAZOLE (FLAGYL) 500 MG tablet Take 1 tablet (500 mg total) by mouth 3 (three) times daily. Patient  not taking: Reported on 09/09/2018 09/03/18   Sharion Balloon, FNP    Family History Family History  Problem Relation Age of Onset  . Arthritis Mother   . Heart attack Father        50s MI  . Heart disease Father   . Asthma Sister   . Heart disease Brother   . Asthma Maternal Grandmother   . Heart attack Maternal Grandmother   . Cancer Brother        liver  . Healthy Daughter   . Healthy Daughter   . Healthy Daughter   . Healthy Daughter   . Healthy Daughter   . Thyroid cancer Neg Hx   . Colon cancer Neg Hx   . Breast cancer Neg Hx   . Prostate cancer Neg  Hx   . Ovarian cancer Neg Hx     Social History Social History   Tobacco Use  . Smoking status: Never Smoker  . Smokeless tobacco: Never Used  Substance Use Topics  . Alcohol use: No  . Drug use: No     Allergies   Anesthesia s-i-40 [propofol]   Review of Systems Review of Systems  Gastrointestinal: Positive for abdominal pain.  All other systems reviewed and are negative.    Physical Exam Updated Vital Signs BP 138/78   Pulse 78   Temp 99.1 F (37.3 C) (Oral)   Resp 13   Wt 76.1 kg   SpO2 95%   BMI 29.72 kg/m   Physical Exam Vitals signs and nursing note reviewed.  HENT:     Head: Normocephalic.     Mouth/Throat:     Mouth: Mucous membranes are moist.  Eyes:     Extraocular Movements: Extraocular movements intact.  Cardiovascular:     Rate and Rhythm: Normal rate and regular rhythm.  Pulmonary:     Effort: Pulmonary effort is normal.     Breath sounds: Normal breath sounds.  Abdominal:     General: Abdomen is flat.     Comments: + LLQ pain   Skin:    General: Skin is warm.     Capillary Refill: Capillary refill takes less than 2 seconds.  Neurological:     General: No focal deficit present.     Mental Status: She is alert and oriented to person, place, and time.  Psychiatric:        Mood and Affect: Mood normal.        Behavior: Behavior normal.      ED Treatments / Results  Labs (all labs ordered are listed, but only abnormal results are displayed) Labs Reviewed  COMPREHENSIVE METABOLIC PANEL - Abnormal; Notable for the following components:      Result Value   Potassium 3.3 (*)    Total Protein 8.2 (*)    All other components within normal limits  CBC - Abnormal; Notable for the following components:   WBC 10.7 (*)    All other components within normal limits  URINALYSIS, ROUTINE W REFLEX MICROSCOPIC - Abnormal; Notable for the following components:   Color, Urine COLORLESS (*)    All other components within normal limits  CULTURE,  BLOOD (ROUTINE X 2)  CULTURE, BLOOD (ROUTINE X 2)  LIPASE, BLOOD  LACTIC ACID, PLASMA  LACTIC ACID, PLASMA    EKG None  Radiology Ct Abdomen Pelvis W Contrast  Result Date: 09/09/2018 CLINICAL DATA:  Abdominal distention. Recent diverticular abscess. Increasing pain and vomiting. EXAM: CT ABDOMEN AND PELVIS WITH CONTRAST TECHNIQUE: Multidetector  CT imaging of the abdomen and pelvis was performed using the standard protocol following bolus administration of intravenous contrast. CONTRAST:  111mL OMNIPAQUE IOHEXOL 300 MG/ML  SOLN COMPARISON:  September 05, 2018 FINDINGS: Lower chest: Lung bases are clear. No effusions. Heart is normal size. Hepatobiliary: No focal hepatic abnormality. Gallbladder unremarkable. Pancreas: No focal abnormality or ductal dilatation. Spleen: No focal abnormality.  Normal size. Adrenals/Urinary Tract: No adrenal abnormality. No focal renal abnormality. No stones or hydronephrosis. Urinary bladder is unremarkable. Stomach/Bowel: Extensive colonic diverticulosis diffusely. Previously seen wall thickening and inflammation around the proximal sigmoid colon have improved. Slight residual stranding noted. No focal abscess. Appendix is normal. Stomach and small bowel decompressed, unremarkable. Vascular/Lymphatic: No evidence of aneurysm or adenopathy. Reproductive: Prior hysterectomy.  No adnexal masses. Other: No free fluid or free air. Musculoskeletal: No acute bony abnormality. IMPRESSION: Diffuse colonic diverticulosis. Improving diverticulitis changes around the proximal sigmoid colon with minimal residual surrounding stranding. No focal abscess. Electronically Signed   By: Rolm Baptise M.D.   On: 09/09/2018 23:15    Procedures Procedures (including critical care time)  Medications Ordered in ED Medications  sodium chloride flush (NS) 0.9 % injection 3 mL (has no administration in time range)  sodium chloride (PF) 0.9 % injection (has no administration in time range)   piperacillin-tazobactam (ZOSYN) IVPB 3.375 g (0 g Intravenous Stopped 09/09/18 2236)  morphine 4 MG/ML injection 4 mg (4 mg Intravenous Given 09/09/18 2202)  ondansetron (ZOFRAN) injection 4 mg (4 mg Intravenous Given 09/09/18 2201)  sodium chloride 0.9 % bolus 1,000 mL (0 mLs Intravenous Stopped 09/09/18 2325)  iohexol (OMNIPAQUE) 300 MG/ML solution 100 mL (100 mLs Intravenous Contrast Given 09/09/18 2259)     Initial Impression / Assessment and Plan / ED Course  I have reviewed the triage vital signs and the nursing notes.  Pertinent labs & imaging results that were available during my care of the patient were reviewed by me and considered in my medical decision making (see chart for details).       Diane Grant is a 68 y.o. female here with LLQ pain. Had diverticulitis with possible abscess vs phlegmon recently. Didn't tolerate cipro and flagyl. Will repeat blood work and CT to r/o perforation vs worsening abscess. Will give zosyn empirically .   11:29 PM Labs showed WBC 10. CT showed no abscess. Given zosyn. Will dc home with augmentin   Final Clinical Impressions(s) / ED Diagnoses   Final diagnoses:  None    ED Discharge Orders    None       Drenda Freeze, MD 09/09/18 2329

## 2018-09-09 NOTE — ED Triage Notes (Signed)
Pt states she was sent by PCP. Pt states she had a CT that showed diverticulitis. Pt states that she was unable to abx as prescribed. Pt states LLQ pain x 2 + weeks

## 2018-09-09 NOTE — Telephone Encounter (Signed)
Spoke with Diane Grant - advised patient to go to ER asap. Spoke with patient advised her to go to ER asap. Patient verbalizes understanding.

## 2018-09-09 NOTE — Discharge Instructions (Signed)
Take augmentin twice daily for a week   Stay hydrated   See your doctor  Return to ER if you have fever, vomiting, severe pain

## 2018-09-10 ENCOUNTER — Ambulatory Visit: Payer: Medicare Other | Admitting: Family Medicine

## 2018-09-12 DIAGNOSIS — Z86010 Personal history of colonic polyps: Secondary | ICD-10-CM | POA: Diagnosis not present

## 2018-09-12 DIAGNOSIS — K5732 Diverticulitis of large intestine without perforation or abscess without bleeding: Secondary | ICD-10-CM | POA: Diagnosis not present

## 2018-09-13 ENCOUNTER — Other Ambulatory Visit (HOSPITAL_COMMUNITY)
Admission: RE | Admit: 2018-09-13 | Discharge: 2018-09-13 | Disposition: A | Discharge status: Home / Self Care | Payer: Medicare Other | Source: Ambulatory Visit | Attending: Pulmonary Disease | Admitting: Pulmonary Disease

## 2018-09-13 DIAGNOSIS — Z20828 Contact with and (suspected) exposure to other viral communicable diseases: Secondary | ICD-10-CM | POA: Diagnosis not present

## 2018-09-13 DIAGNOSIS — Z01818 Encounter for other preprocedural examination: Secondary | ICD-10-CM | POA: Insufficient documentation

## 2018-09-13 LAB — SARS CORONAVIRUS 2 (TAT 6-24 HRS): SARS Coronavirus 2: NEGATIVE

## 2018-09-14 LAB — CULTURE, BLOOD (ROUTINE X 2)
Culture: NO GROWTH
Culture: NO GROWTH
Special Requests: ADEQUATE
Special Requests: ADEQUATE

## 2018-09-15 ENCOUNTER — Other Ambulatory Visit (HOSPITAL_COMMUNITY): Payer: Medicare Other

## 2018-09-17 ENCOUNTER — Encounter: Payer: Self-pay | Admitting: Pulmonary Disease

## 2018-09-17 ENCOUNTER — Other Ambulatory Visit: Payer: Self-pay

## 2018-09-17 ENCOUNTER — Ambulatory Visit (INDEPENDENT_AMBULATORY_CARE_PROVIDER_SITE_OTHER): Payer: Medicare Other | Admitting: Pulmonary Disease

## 2018-09-17 VITALS — BP 116/70 | HR 67 | Temp 98.1°F | Ht 63.5 in | Wt 157.8 lb

## 2018-09-17 DIAGNOSIS — R0602 Shortness of breath: Secondary | ICD-10-CM

## 2018-09-17 LAB — PULMONARY FUNCTION TEST
DL/VA % pred: 120 %
DL/VA: 5.03 ml/min/mmHg/L
DLCO cor % pred: 113 %
DLCO cor: 21.8 ml/min/mmHg
DLCO unc % pred: 117 %
DLCO unc: 22.56 ml/min/mmHg
FEF 25-75 Post: 2.31 L/sec
FEF 25-75 Pre: 1.92 L/sec
FEF2575-%Change-Post: 20 %
FEF2575-%Pred-Post: 133 %
FEF2575-%Pred-Pre: 110 %
FEV1-%Change-Post: 4 %
FEV1-%Pred-Post: 117 %
FEV1-%Pred-Pre: 112 %
FEV1-Post: 2.16 L
FEV1-Pre: 2.07 L
FEV1FVC-%Change-Post: 3 %
FEV1FVC-%Pred-Pre: 102 %
FEV6-%Change-Post: 0 %
FEV6-%Pred-Post: 114 %
FEV6-%Pred-Pre: 113 %
FEV6-Post: 2.61 L
FEV6-Pre: 2.6 L
FEV6FVC-%Pred-Post: 104 %
FEV6FVC-%Pred-Pre: 104 %
FVC-%Change-Post: 0 %
FVC-%Pred-Post: 109 %
FVC-%Pred-Pre: 109 %
FVC-Post: 2.61 L
FVC-Pre: 2.6 L
Post FEV1/FVC ratio: 83 %
Post FEV6/FVC ratio: 100 %
Pre FEV1/FVC ratio: 80 %
Pre FEV6/FVC Ratio: 100 %
RV % pred: 97 %
RV: 2.08 L
TLC % pred: 93 %
TLC: 4.67 L

## 2018-09-17 NOTE — Addendum Note (Signed)
Addended by: Hildred Alamin I on: 09/17/2018 01:36 PM   Modules accepted: Orders

## 2018-09-17 NOTE — Patient Instructions (Signed)
I am glad you are doing well with the breathing Your lung function test is normal.  There is no clear evidence of asthma COPD We will continue to observe you off inhalers Follow-up in 6 months.

## 2018-09-17 NOTE — Progress Notes (Signed)
PFT completed today 09/17/18.  

## 2018-09-17 NOTE — Progress Notes (Signed)
ILYNN Grant    [id number]    1950-11-12  Primary Care Physician:Gottschalk, Koleen Distance, DO  Referring Physician: [name], DO [place],  [place] [address]  Chief complaint: Follow-up for dyspnea  HPI: 68 year old with history of hypertension, hypothyroidism Complains of dyspnea since February 2020.  Symptoms started after confirmed episode of flu a infection.  Has dyspnea on exertion and at rest, no cough.  Occasional wheezing.  Also complains of left chest pain with occasional radiation to the right.  No palpitations Has symptoms of allergic rhinitis exacerbated by pollen.  Reports a couple of episodes of acute shortness of breath last summer on exposure to pollen. Denies any heartburn, acid reflux  Started on Symbicort and albuterol inhaler by her primary care physician but she has noticed any difference.   She was tested for COVID-19 at community testing site at [facility] on 5/7.  As per the patient the test was negative.  Denies any fevers, chills, sick contacts or recent travel.  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  States that overall her breathing has improved since last visit.  She stopped using the inhalers.  She has mild dyspnea on exertion.  No cough, sputum production, wheezing. She is here for review of PFTs.  COVID test pre procedure was negative.  She has followed up with Dr. Ellyn Hack, cardiology.  Echo and stress test were ordered but patient deferred due to recent episode of diverticulitis.  Outpatient Encounter Medications as of 09/17/2018  Medication Sig  . albuterol (VENTOLIN HFA) 108 (90 Base) MCG/ACT  inhaler Inhale 2 puffs into the lungs every 6 (six) hours as needed for wheezing or shortness of breath.  . hydrochlorothiazide (HYDRODIURIL) 25 MG tablet Take 1 tablet (25 mg total) by mouth daily.  . hydrOXYzine (ATARAX/VISTARIL) 10 MG tablet Take 1 tablet (10 mg total) by mouth 3 (three) times daily as needed for itching.  . levothyroxine (EUTHYROX) 75 MCG tablet TAKE 1 TABLET BY MOUTH ONCE DAILY BEFORE BREAKFAST  . losartan (COZAAR) 100 MG tablet Take 1 tablet (100 mg total) by mouth daily.  . meclizine (ANTIVERT) 25 MG tablet Take 1 tablet (25 mg total) by mouth 3 (three) times daily as needed for dizziness.  . meloxicam (MOBIC) 7.5 MG tablet Take 1 tablet (7.5 mg total) by mouth 2 (two) times daily as needed for pain.  . methocarbamol (ROBAXIN) 500 MG tablet Take 1 tablet (500 mg total) by mouth 4 (four) times daily as needed for muscle spasms.  Marland Kitchen amoxicillin-clavulanate (AUGMENTIN) 875-125 MG tablet Take 1 tablet by mouth 2 (two) times daily. One po bid x 7 days  . budesonide-formoterol (SYMBICORT) 80-4.5 MCG/ACT inhaler Inhale 2 puffs into the lungs 2 (two) times a day. (rinse mouth after use) (Patient not taking: Reported on 09/17/2018)  . ciprofloxacin (CIPRO) 500 MG tablet Take 1 tablet (500 mg total) by mouth 2 (two) times daily. (Patient not taking: Reported on 09/09/2018)  . metroNIDAZOLE (FLAGYL) 500 MG tablet Take 1 tablet (500 mg total) by mouth 3 (three) times daily. (Patient not taking: Reported on 09/09/2018)   No facility-administered encounter medications on file as of 09/17/2018.  Physical Exam: Blood pressure 118/72, pulse 86, height 5\' 3"  (1.6 m), weight 157 lb 12.8 oz (71.6 kg), SpO2 99 %. Gen:      No acute distress HEENT:  EOMI, sclera anicteric Neck:     No masses; no thyromegaly Lungs:    Clear to auscultation bilaterally; normal respiratory effort CV:         Regular rate and rhythm; no murmurs Abd:      + bowel sounds; soft, non-tender; no palpable masses, no  distension Ext:    No edema; adequate peripheral perfusion Skin:      Warm and dry; no rash Neuro: alert and oriented x 3 Psych: normal mood and affect  Data Reviewed: Imaging: Chest x-ray 05/28/2018- clear lungs.  No active cardiopulmonary disease.  PFTs: 09/09/2018 FVC 2.61 [109%), FEV1 2.16 [170%],/F 83, TLC 4.67 [93%), DLCO 22.56 [117%] Normal test  Labs: CBC 06/20/2018- WBC 10.5, eos 0.7%, absolute eosinophil count 73 TSH 06/20/2018-1.76 IgE 06/20/2018-740 BNP 06/20/2018-10  EKG 04/07/2018- normal sinus rhythm, no acute ST-T changes, heart rate 72  Assessment:  Evaluation for dyspnea She likely had episode of bronchitis, reactive airways after flu and viral illness earlier this year.  Her COVID PCR testing was negative x 2 .  She is requesting a COVID antibody testing done  Labs show elevated IgE which is likely from allergies.  PFTs are normal with no evidence of obstruction or asthma. As her symptoms are improved we will continue to observe off inhalers.  Follow-up in 6 months    Plan/Recommendations: - Send COVID antibody test - Follow-up in 6 months  Marshell Garfinkel MD [facility] 09/17/2018, 12:12 PM  CC: [name], DO

## 2018-09-18 LAB — IGG: IgG (Immunoglobin G), Serum: 960 mg/dL (ref 600–1540)

## 2018-09-26 ENCOUNTER — Telehealth: Payer: Self-pay | Admitting: Pulmonary Disease

## 2018-09-26 DIAGNOSIS — Z20822 Contact with and (suspected) exposure to covid-19: Secondary | ICD-10-CM

## 2018-09-26 NOTE — Telephone Encounter (Signed)
ATC patient, unable to reach, left message to call back

## 2018-09-26 NOTE — Telephone Encounter (Signed)
Called spoke with patient,. Let her aware the wrong lab was ordered. Patient states she is not able to come in today 09/26/18 for lab draw. I did let her know the order would be placed then she can come in whenever is convenient for her.  Order placed per Lab technician Irma according to other providers ordering antibody test.   Nothing further needed at this time.

## 2018-09-26 NOTE — Telephone Encounter (Signed)
Let her know that the wrong test was drawn She will need to get her lab drawn again to get the right test for SARS-CoV-2 IgG  Marshell Garfinkel MD [facility] 09/26/2018, 1:51 PM

## 2018-09-26 NOTE — Telephone Encounter (Signed)
Patient calling for results of antibody test.   Dr. Vaughan Browner please advise on results

## 2018-09-29 NOTE — Telephone Encounter (Signed)
Sars-cov-2-IgG order was placed 09/26/18, by Ander Purpura, RN.  Patient is aware correct lab has been placed.  Nothing further at this time.

## 2018-10-14 DIAGNOSIS — K5792 Diverticulitis of intestine, part unspecified, without perforation or abscess without bleeding: Secondary | ICD-10-CM | POA: Diagnosis not present

## 2018-10-14 DIAGNOSIS — Z86010 Personal history of colonic polyps: Secondary | ICD-10-CM | POA: Diagnosis not present

## 2018-10-14 DIAGNOSIS — Z8719 Personal history of other diseases of the digestive system: Secondary | ICD-10-CM | POA: Diagnosis not present

## 2018-10-14 DIAGNOSIS — R1032 Left lower quadrant pain: Secondary | ICD-10-CM | POA: Diagnosis not present

## 2018-10-15 ENCOUNTER — Ambulatory Visit: Payer: Medicare Other | Admitting: Gastroenterology

## 2018-10-15 DIAGNOSIS — K5790 Diverticulosis of intestine, part unspecified, without perforation or abscess without bleeding: Secondary | ICD-10-CM | POA: Diagnosis not present

## 2018-10-15 DIAGNOSIS — K5792 Diverticulitis of intestine, part unspecified, without perforation or abscess without bleeding: Secondary | ICD-10-CM | POA: Diagnosis not present

## 2018-10-15 DIAGNOSIS — N200 Calculus of kidney: Secondary | ICD-10-CM | POA: Diagnosis not present

## 2018-10-15 DIAGNOSIS — R1032 Left lower quadrant pain: Secondary | ICD-10-CM | POA: Diagnosis not present

## 2018-10-20 ENCOUNTER — Telehealth: Payer: Self-pay | Admitting: Pulmonary Disease

## 2018-10-20 NOTE — Telephone Encounter (Signed)
Left message for Lynn to call back

## 2018-10-20 NOTE — Telephone Encounter (Signed)
That was a [facility] test there won't be anything she can do the provider or cma ordered it wrong. once it is already resulted there is nothing to do. The provider did inform her the wrong test had been ordered.

## 2018-10-21 NOTE — Telephone Encounter (Signed)
lmtcb for [employer].

## 2018-10-21 NOTE — Telephone Encounter (Signed)
Spoke with [name] and was advised to check if Kathlee Nations, coding and billing specialist, may be able to help shed some light.   Kathlee Nations, do you know how this pt can get out of her $5 [facility] bill as an erroneous lab was placed by our office. Thanks.

## 2018-10-21 NOTE — Telephone Encounter (Signed)
Diane Grant returned call. She states the pt was billed $5 but insurance was billed over $60. Pt does not want to pay the $5 as this was an error not by the pt. Will speak with supervisor, [name], and will reach back out to pt to see what next steps we need to take.

## 2018-10-22 DIAGNOSIS — Z1231 Encounter for screening mammogram for malignant neoplasm of breast: Secondary | ICD-10-CM | POA: Diagnosis not present

## 2018-10-24 ENCOUNTER — Telehealth: Payer: Self-pay | Admitting: Family Medicine

## 2018-10-24 NOTE — Telephone Encounter (Signed)
Forward to her PCP for response

## 2018-10-24 NOTE — Telephone Encounter (Signed)
Patient is requesting that the referral for additional imaging to be set up in [place]. Results are in care everywhere

## 2018-10-26 ENCOUNTER — Encounter: Payer: Self-pay | Admitting: Family Medicine

## 2018-10-27 ENCOUNTER — Other Ambulatory Visit: Payer: Self-pay | Admitting: Family Medicine

## 2018-10-27 DIAGNOSIS — Z1239 Encounter for other screening for malignant neoplasm of breast: Secondary | ICD-10-CM

## 2018-10-27 DIAGNOSIS — R922 Inconclusive mammogram: Secondary | ICD-10-CM

## 2018-10-27 NOTE — Progress Notes (Signed)
Birads 0.  Needs u/s for further evaluation of mammo.

## 2018-10-27 NOTE — Telephone Encounter (Signed)
Patient aware and states that she would like to go to [facility] if possible for [place]

## 2018-10-27 NOTE — Telephone Encounter (Signed)
It appears that they could not get a full exam of the breasts with mammo.  Recommending ultrasound.

## 2018-10-27 NOTE — Telephone Encounter (Signed)
Clarification given in telephone encounter.

## 2018-10-29 ENCOUNTER — Telehealth: Payer: Self-pay

## 2018-10-29 NOTE — Progress Notes (Signed)
I have routed this to [facility]) she is now handling abnormal Mammo's.

## 2018-10-29 NOTE — Telephone Encounter (Addendum)
Called patient and notified her of appointment for breast ultrasound at [facility] for Thursday 11/06/18 at 1230 PM patient to arrive at 1215 and to bring ID, insurance card and wear mask.  Patient given phone number to call if needed. Mammo report from [facility] has been scanned in chart and faxed to the [facility] - they will request images from [place].  MPulliam, CMA/RT(R)

## 2018-10-29 NOTE — Telephone Encounter (Signed)
I have forwarded this information to [facility] ([facility]) she is handling Abnormal Mamm's now.

## 2018-10-30 NOTE — Telephone Encounter (Signed)
Diane Nations do you have any updated information on this

## 2018-10-31 NOTE — Progress Notes (Signed)
Appointment has been made for patient and she is aware of the appointment information.  Appointment is at [facility] Thursday 11/06/2018 at 1230 patient to arrive at 1215. MPulliam, CMA/RT(R)

## 2018-10-31 NOTE — Telephone Encounter (Signed)
There is nothing I can do, if [facility] actually processed the lab, they have every right to charge for it.  We may  Need to check with Tammy or Jackson Latino to see if they feel there is anything they can do.

## 2018-10-31 NOTE — Telephone Encounter (Signed)
Message has been placed on [place]' desk for review.

## 2018-11-03 NOTE — Telephone Encounter (Signed)
Spoke with [employer]. Advised her that the matter is still being worked on and that we have not forgotten about the issue. Advised her that as soon as we get an answer, someone from our office will call her. She verbalized understanding.

## 2018-11-03 NOTE — Telephone Encounter (Signed)
Daughter Diane Grant is calling back to check on the status of getting this mistake taken care of.

## 2018-11-03 NOTE — Telephone Encounter (Signed)
Will await f/u.

## 2018-11-06 ENCOUNTER — Ambulatory Visit
Admission: RE | Admit: 2018-11-06 | Discharge: 2018-11-06 | Disposition: A | Discharge status: Home / Self Care | Payer: Medicare Other | Source: Ambulatory Visit | Attending: Family Medicine | Admitting: Family Medicine

## 2018-11-06 ENCOUNTER — Other Ambulatory Visit: Payer: Self-pay

## 2018-11-06 ENCOUNTER — Other Ambulatory Visit: Payer: Self-pay | Admitting: Family Medicine

## 2018-11-06 DIAGNOSIS — N6320 Unspecified lump in the left breast, unspecified quadrant: Secondary | ICD-10-CM

## 2018-11-06 DIAGNOSIS — Z1239 Encounter for other screening for malignant neoplasm of breast: Secondary | ICD-10-CM

## 2018-11-06 DIAGNOSIS — N6321 Unspecified lump in the left breast, upper outer quadrant: Secondary | ICD-10-CM | POA: Diagnosis not present

## 2018-11-06 DIAGNOSIS — R922 Inconclusive mammogram: Secondary | ICD-10-CM

## 2018-11-06 DIAGNOSIS — N6311 Unspecified lump in the right breast, upper outer quadrant: Secondary | ICD-10-CM | POA: Diagnosis not present

## 2018-11-06 NOTE — Telephone Encounter (Signed)
When Diane Grant returns to office, we will discuss matter.

## 2018-11-11 DIAGNOSIS — R1032 Left lower quadrant pain: Secondary | ICD-10-CM | POA: Diagnosis not present

## 2018-11-11 DIAGNOSIS — K5792 Diverticulitis of intestine, part unspecified, without perforation or abscess without bleeding: Secondary | ICD-10-CM | POA: Diagnosis not present

## 2018-11-11 DIAGNOSIS — Z86010 Personal history of colonic polyps: Secondary | ICD-10-CM | POA: Diagnosis not present

## 2018-11-11 DIAGNOSIS — R1310 Dysphagia, unspecified: Secondary | ICD-10-CM | POA: Diagnosis not present

## 2018-11-11 NOTE — Telephone Encounter (Signed)
All information has been sent and explained to [place]. ATC  call the daughter and let her know our director has information in hand and states he will take care of the issue, unable to reach Diane Grant will leave open until we touch base with daughter.

## 2018-11-14 NOTE — Telephone Encounter (Signed)
ATC daughter to update on wrong lab issues. Left message to call me back and ask for Diane Grant. Will await phone call from daughter.

## 2018-11-14 NOTE — Telephone Encounter (Signed)
Spoke with daughter. She is going to fax bill over ATTN to [employer] She did not mention if her mother was going to come in still for the antibody test, but informed her she could come in still. I verified and confirmed with Lab tech Irma that order was still active  will leave open until we receive fax, Will route to TD also to see follow up on.

## 2018-11-14 NOTE — Telephone Encounter (Signed)
Pt daughter returned call  

## 2018-11-17 NOTE — Telephone Encounter (Signed)
Tammy have you received this fax?

## 2018-11-18 NOTE — Telephone Encounter (Signed)
TD confirmed she has received the bill from patient.  Will close message

## 2018-11-25 ENCOUNTER — Telehealth: Payer: Self-pay | Admitting: Pulmonary Disease

## 2018-11-25 NOTE — Telephone Encounter (Signed)
Called and spoke with Patient's daughter, Jeani Hawking.  Jeani Hawking stated she was told covid antibody bill was taken care of. Jeani Hawking stated Patient received another bill for covid antibody test, requesting past due payment.  Per 10/20/18 telephone encounter - On 11/18/18, Lauren confirmed TD received bill from Patient.   Message routed to [employer] 

## 2018-11-26 DIAGNOSIS — K5730 Diverticulosis of large intestine without perforation or abscess without bleeding: Secondary | ICD-10-CM | POA: Diagnosis not present

## 2018-11-26 DIAGNOSIS — K449 Diaphragmatic hernia without obstruction or gangrene: Secondary | ICD-10-CM | POA: Diagnosis not present

## 2018-11-26 DIAGNOSIS — K2950 Unspecified chronic gastritis without bleeding: Secondary | ICD-10-CM | POA: Diagnosis not present

## 2018-11-26 DIAGNOSIS — R1310 Dysphagia, unspecified: Secondary | ICD-10-CM | POA: Diagnosis not present

## 2018-11-26 DIAGNOSIS — Z86010 Personal history of colonic polyps: Secondary | ICD-10-CM | POA: Diagnosis not present

## 2018-11-26 DIAGNOSIS — K3189 Other diseases of stomach and duodenum: Secondary | ICD-10-CM | POA: Diagnosis not present

## 2018-12-05 NOTE — Telephone Encounter (Signed)
Noted. Awaiting an update from [place].

## 2018-12-10 NOTE — Telephone Encounter (Signed)
This is being handled by Maryann Conners.

## 2018-12-22 NOTE — Telephone Encounter (Signed)
Noted. Will close encounter per Kathlee Nations she has forwarded this information to [employer].

## 2019-01-23 ENCOUNTER — Telehealth: Payer: Self-pay | Admitting: Family Medicine

## 2019-01-23 NOTE — Telephone Encounter (Signed)
Discussed ultrasound results from both breasts and patient verbalized understanding. Patient states that she was supposed to have further testing after seeing cardiology earlier in the year. Per note she was to have an echo and nuclear studies but she declined at the time due to covid. She would like to know if we can schedule those after the first of the year? I advised that these were originally ordered by cardiology but she wants to know if we can order them

## 2019-01-23 NOTE — Telephone Encounter (Signed)
Patient aware to have tests ordered by cardiology office

## 2019-01-23 NOTE — Telephone Encounter (Signed)
Needs to be ordered by cardiology.

## 2019-03-14 ENCOUNTER — Ambulatory Visit: Payer: Medicare Other

## 2019-04-08 ENCOUNTER — Other Ambulatory Visit: Payer: Self-pay | Admitting: Family Medicine

## 2019-04-08 MED ORDER — MELOXICAM 7.5 MG PO TABS: 7.5000 mg | ORAL_TABLET | Freq: Two times a day (BID) | ORAL | 0 refills | Status: DC | PRN

## 2019-04-08 MED ORDER — METHOCARBAMOL 500 MG PO TABS: 500.0000 mg | ORAL_TABLET | Freq: Four times a day (QID) | ORAL | 0 refills | Status: DC | PRN

## 2019-04-08 NOTE — Telephone Encounter (Signed)
°  Medication Request  04/08/2019  What is the name of the medication? meloxicam (MOBIC) 7.5 MG tablet and methocarbamol (ROBAXIN) 500 MG tablet  Have you contacted your pharmacy to request a refill? Yes no refills  Which pharmacy would you like this sent to? [facility] in [place] - phone number [phone]   Patient notified that their request is being sent to the clinical staff for review and that they should receive a call once it is complete. If they do not receive a call within 24 hours they can check with their pharmacy or our office.

## 2019-04-14 ENCOUNTER — Telehealth: Payer: Self-pay | Admitting: Family Medicine

## 2019-04-14 NOTE — Chronic Care Management (AMB) (Signed)
  Chronic Care Management   Outreach Note  04/14/2019 Name: Diane Grant MRN: [id number] DOB: 01/03/1951  Diane Grant is a 69 y.o. year old female who is a primary care patient of [name], DO. I reached out to [name] by phone today in response to a referral sent by Ms. Reece Packer Maeder's health plan.     An unsuccessful telephone outreach was attempted today. The patient was referred to the case management team for assistance with care management and care coordination.   Follow Up Plan: A HIPPA compliant phone message was left for the patient providing contact information and requesting a return call. The care management team will reach out to the patient again over the next 7 days.  If patient returns call to provider office, please advise to call [place] at [phone].  [place], [place] [address] Direct Dial: [phone] [email or web address] Website: [email or web address]

## 2019-04-14 NOTE — Chronic Care Management (AMB) (Signed)
  Chronic Care Management   Note  04/14/2019 Name: Melania Kirks MRN: [id number] DOB: Jun 22, 1950  Tatjana Turcott is a 69 y.o. year old female who is a primary care patient of [name], DO. I reached out to [name] by phone today in response to a referral sent by Ms. Reece Packer Zhong's health plan.     Ms. Yager was given information about Chronic Care Management services today including:  1. CCM service includes personalized support from designated clinical staff supervised by her physician, including individualized plan of care and coordination with other care providers 2. 24/7 contact phone numbers for assistance for urgent and routine care needs. 3. Service will only be billed when office clinical staff spend 20 minutes or more in a month to coordinate care. 4. Only one practitioner may furnish and bill the service in a calendar month. 5. The patient may stop CCM services at any time (effective at the end of the month) by phone call to the office staff. 6. The patient will be responsible for cost sharing (co-pay) of up to 20% of the service fee (after annual deductible is met).  Patient agreed to services and verbal consent obtained.   Follow up plan: Telephone appointment with care management team member scheduled for: 07/03/2019  [facility]  [address] [place], [place] [address] Direct Dial: [email or web address] Website: [email or web address]

## 2019-04-22 DIAGNOSIS — R109 Unspecified abdominal pain: Secondary | ICD-10-CM | POA: Diagnosis not present

## 2019-04-22 DIAGNOSIS — R140 Abdominal distension (gaseous): Secondary | ICD-10-CM | POA: Diagnosis not present

## 2019-04-22 DIAGNOSIS — K5730 Diverticulosis of large intestine without perforation or abscess without bleeding: Secondary | ICD-10-CM | POA: Diagnosis not present

## 2019-04-22 DIAGNOSIS — R1033 Periumbilical pain: Secondary | ICD-10-CM | POA: Diagnosis not present

## 2019-04-22 DIAGNOSIS — Z86010 Personal history of colonic polyps: Secondary | ICD-10-CM | POA: Diagnosis not present

## 2019-04-22 DIAGNOSIS — R1013 Epigastric pain: Secondary | ICD-10-CM | POA: Diagnosis not present

## 2019-04-22 DIAGNOSIS — K219 Gastro-esophageal reflux disease without esophagitis: Secondary | ICD-10-CM | POA: Diagnosis not present

## 2019-05-08 ENCOUNTER — Ambulatory Visit
Admission: RE | Admit: 2019-05-08 | Discharge: 2019-05-08 | Disposition: A | Discharge status: Home / Self Care | Payer: Medicare Other | Source: Ambulatory Visit | Attending: Family Medicine | Admitting: Family Medicine

## 2019-05-08 ENCOUNTER — Other Ambulatory Visit: Payer: Self-pay

## 2019-05-08 DIAGNOSIS — N6002 Solitary cyst of left breast: Secondary | ICD-10-CM | POA: Diagnosis not present

## 2019-05-08 DIAGNOSIS — N6320 Unspecified lump in the left breast, unspecified quadrant: Secondary | ICD-10-CM

## 2019-06-03 ENCOUNTER — Ambulatory Visit (INDEPENDENT_AMBULATORY_CARE_PROVIDER_SITE_OTHER): Payer: Medicare Other | Admitting: *Deleted

## 2019-06-03 VITALS — Ht 63.5 in | Wt 157.8 lb

## 2019-06-03 DIAGNOSIS — Z0000 Encounter for general adult medical examination without abnormal findings: Secondary | ICD-10-CM | POA: Diagnosis not present

## 2019-06-03 NOTE — Progress Notes (Signed)
MEDICARE ANNUAL WELLNESS VISIT  06/03/2019  Telephone Visit Disclaimer This Medicare AWV was conducted by telephone due to national recommendations for restrictions regarding the COVID-19 Pandemic (e.g. social distancing).  I verified, using two identifiers, that I am speaking with Diane Grant or their authorized healthcare agent. I discussed the limitations, risks, security, and privacy concerns of performing an evaluation and management service by telephone and the potential availability of an in-person appointment in the future. The patient expressed understanding and agreed to proceed.   Subjective:  Diane Grant is a 69 y.o. female patient of [name], DO who had a Medicare Annual Wellness Visit today via telephone. Jan is Retired and lives with their spouse. she has 5 children. she reports that she is socially active and does interact with friends/family regularly. she is minimally physically active and enjoys gardening.  Patient Care Team: [name], DO as PCP - General (Family Medicine) Minus Breeding, MD as PCP - Cardiology (Cardiology) Gala Romney Cristopher Estimable, MD as Consulting Physician (Gastroenterology) Ilean China, RN as Registered Nurse  Advanced Directives 06/03/2019 09/09/2018 06/02/2018  Does Patient Have a Medical Advance Directive? No No No  Would patient like information on creating a medical advance directive? Yes (MAU/Ambulatory/Procedural Areas - Information given) Yes (ED - Information included in AVS) Yes (MAU/Ambulatory/Procedural Areas - Information given)    Hospital Utilization Over the Past 12 Months: # of hospitalizations or ER visits: 0 # of surgeries: 0  Review of Systems    Patient reports that her overall health is unchanged compared to last year.  History obtained from chart review and the patient General ROS: negative Allergy and Immunology ROS: positive for - seasonal allergies Gastrointestinal  ROS: positive for - abdominal pain, appetite loss and gas/bloating Musculoskeletal ROS: positive for - pain in back - lower  Patient Reported Readings (BP, Pulse, CBG, Weight, etc) none  Pain Assessment Pain : 0-10 Pain Score: 3  Pain Type: Chronic pain Pain Location: Back Pain Orientation: Lower, Mid, Left Pain Radiating Towards: Occasionally radiates to left leg Pain Descriptors / Indicators: Constant, Spasm, Throbbing Pain Onset: More than a month ago Pain Frequency: Constant Pain Relieving Factors: Melaxicam and Robaxin when takes Effect of Pain on Daily Activities: Yard Work, Wears brace while doing yard work  Pain Relieving Factors: Melaxicam and Robaxin when takes  Current Medications & Allergies (verified) Allergies as of 06/03/2019      Reactions   Anesthesia S-i-40 [propofol]    Per patient need to be careful when giving this      Medication List       Accurate as of June 03, 2019 11:27 AM. If you have any questions, ask your nurse or doctor.        STOP taking these medications   amoxicillin-clavulanate 875-125 MG tablet Commonly known as: Augmentin   budesonide-formoterol 80-4.5 MCG/ACT inhaler Commonly known as: Symbicort   ciprofloxacin 500 MG tablet Commonly known as: Cipro   metroNIDAZOLE 500 MG tablet Commonly known as: Flagyl     TAKE these medications   albuterol 108 (90 Base) MCG/ACT inhaler Commonly known as: VENTOLIN HFA Inhale 2 puffs into the lungs every 6 (six) hours as needed for wheezing or shortness of breath.   BENEFIBER PO Take by mouth.   dicyclomine 20 MG tablet Commonly known as: BENTYL Take 20 mg by mouth 3 (three) times daily.   hydrochlorothiazide 25 MG tablet Commonly known as: HYDRODIURIL Take 1 tablet (25 mg total)  by mouth daily.   hydrOXYzine 10 MG tablet Commonly known as: ATARAX/VISTARIL Take 1 tablet (10 mg total) by mouth 3 (three) times daily as needed for itching.   levothyroxine 75 MCG  tablet Commonly known as: Euthyrox TAKE 1 TABLET BY MOUTH ONCE DAILY BEFORE BREAKFAST   losartan 100 MG tablet Commonly known as: COZAAR Take 1 tablet (100 mg total) by mouth daily.   meclizine 25 MG tablet Commonly known as: ANTIVERT Take 1 tablet (25 mg total) by mouth 3 (three) times daily as needed for dizziness.   meloxicam 7.5 MG tablet Commonly known as: MOBIC Take 1 tablet (7.5 mg total) by mouth 2 (two) times daily as needed for pain.   methocarbamol 500 MG tablet Commonly known as: ROBAXIN Take 1 tablet (500 mg total) by mouth 4 (four) times daily as needed for muscle spasms.   omeprazole 40 MG capsule Commonly known as: PRILOSEC Take 40 mg by mouth daily.       History (reviewed): Past Medical History:  Diagnosis Date  . Degenerative disc disease, lumbar   . Hypertension   . Thyroid disease    hypothyroidism   Past Surgical History:  Procedure Laterality Date  . ABDOMINAL HYSTERECTOMY    . ROTATOR CUFF REPAIR  11/30/2015   Right shoulder   Family History  Problem Relation Age of Onset  . Arthritis Mother   . Heart attack Father        33s MI  . Heart disease Father   . Asthma Sister   . Heart disease Brother   . Asthma Maternal Grandmother   . Heart attack Maternal Grandmother   . Cancer Brother        liver  . Healthy Daughter   . Healthy Daughter   . Healthy Daughter   . Healthy Daughter   . Healthy Daughter   . Thyroid cancer Neg Hx   . Colon cancer Neg Hx   . Breast cancer Neg Hx   . Prostate cancer Neg Hx   . Ovarian cancer Neg Hx    Social History   Socioeconomic History  . Marital status: Married    Spouse name: Jeneen Rinks  . Number of children: 5  . Years of education: 16  . Highest education level: High school graduate  Occupational History  . Not on file  Tobacco Use  . Smoking status: Never Smoker  . Smokeless tobacco: Never Used  Substance and Sexual Activity  . Alcohol use: No  . Drug use: No  . Sexual activity: Not  Currently  Other Topics Concern  . Not on file  Social History Narrative   Lives with husband.     Social Determinants of Health   Financial Resource Strain: Low Risk   . Difficulty of Paying Living Expenses: Not hard at all  Food Insecurity: No Food Insecurity  . Worried About Charity fundraiser in the Last Year: Never true  . Ran Out of Food in the Last Year: Never true  Transportation Needs: No Transportation Needs  . Lack of Transportation (Medical): No  . Lack of Transportation (Non-Medical): No  Physical Activity: Inactive  . Days of Exercise per Week: 0 days  . Minutes of Exercise per Session: 0 min  Stress: No Stress Concern Present  . Feeling of Stress : Not at all  Social Connections: Not Isolated  . Frequency of Communication with Friends and Family: More than three times a week  . Frequency of Social Gatherings with Friends and  Family: Once a week  . Attends Religious Services: More than 4 times per year  . Active Member of Clubs or Organizations: Yes  . Attends Archivist Meetings: More than 4 times per year  . Marital Status: Married    Activities of Daily Living In your present state of health, do you have any difficulty performing the following activities: 06/03/2019  Hearing? Y  Comment Left Ear  Vision? Y  Comment Right Eye  Difficulty concentrating or making decisions? N  Walking or climbing stairs? N  Dressing or bathing? N  Doing errands, shopping? N  Preparing Food and eating ? N  Using the Toilet? N  In the past six months, have you accidently leaked urine? Y  Do you have problems with loss of bowel control? N  Managing your Medications? N  Managing your Finances? N  Housekeeping or managing your Housekeeping? N  Some recent data might be hidden    Patient Education/ Literacy How often do you need to have someone help you when you read instructions, pamphlets, or other written materials from your doctor or pharmacy?: 1 - Never What  is the last grade level you completed in school?: 12th Grade  Exercise Current Exercise Habits: The patient does not participate in regular exercise at present, Exercise limited by: orthopedic condition(s)  Diet Patient reports consuming 2 meals a day and 1 snack(s) a day Patient reports that her primary diet is: Regular Patient reports that she does have regular access to food.   Depression Screen PHQ 2/9 Scores 06/03/2019 06/02/2018 04/07/2018 03/17/2018 10/30/2017 03/08/2017  PHQ - 2 Score 0 0 0 0 0 0     Fall Risk Fall Risk  06/03/2019 06/02/2018 04/07/2018 03/17/2018 10/30/2017  Falls in the past year? 0 0 0 0 No  Number falls in past yr: 0 - - - -  Injury with Fall? 0 - - - -  Risk for fall due to : No Fall Risks - - - -  Follow up Falls evaluation completed - - - -     Objective:  Diane Grant seemed alert and oriented and she participated appropriately during our telephone visit.  Blood Pressure Weight BMI  BP Readings from Last 3 Encounters:  09/17/18 116/70  09/09/18 (!) 141/80  06/23/18 114/60   Wt Readings from Last 3 Encounters:  06/03/19 157 lb 13.6 oz (71.6 kg)  09/17/18 157 lb 12.8 oz (71.6 kg)  09/09/18 167 lb 12.8 oz (76.1 kg)   BMI Readings from Last 1 Encounters:  06/03/19 27.52 kg/m    *Unable to obtain current vital signs, weight, and BMI due to telephone visit type  Hearing/Vision  . Anissah did not seem to have difficulty with hearing/understanding during the telephone conversation . Reports that she has not had a formal eye exam by an eye care professional within the past year . Reports that she has not had a formal hearing evaluation within the past year *Unable to fully assess hearing and vision during telephone visit type  Cognitive Function: 6CIT Screen 06/03/2019 06/02/2018  What Year? 0 points 0 points  What month? 0 points 0 points  What time? 0 points 0 points  Count back from 20 0 points 0 points  Months in reverse 0 points 4 points   Repeat phrase 0 points 0 points  Total Score 0 4   (Normal:0-7, Significant for Dysfunction: >8)  Normal Cognitive Function Screening: Yes   Immunization & Health Maintenance Record Immunization History  Administered Date(s) Administered  . Influenza, High Dose Seasonal PF 11/15/2015, 02/24/2017, 12/04/2017  . Influenza,inj,Quad PF,6+ Mos 02/19/2017  . PFIZER SARS-COV-2 Vaccination 02/24/2019, 03/17/2019  . Pneumococcal Polysaccharide-23 03/28/2016  . Tdap 06/15/2015    Health Maintenance  Topic Date Due  . PNA vac Low Risk Adult (2 of 2 - PCV13) 03/28/2017  . INFLUENZA VACCINE  09/06/2019  . MAMMOGRAM  10/21/2020  . COLONOSCOPY  11/26/2023  . TETANUS/TDAP  06/14/2025  . DEXA SCAN  Completed  . COVID-19 Vaccine  Completed  . Hepatitis C Screening  Completed       Assessment  This is a routine wellness examination for [employer].  Health Maintenance: Due or Overdue Health Maintenance Due  Topic Date Due  . PNA vac Low Risk Adult (2 of 2 - PCV13) 03/28/2017    Diane Grant does not need a referral for Community Assistance: Care Management:   no Social Work:    no Prescription Assistance:  no Nutrition/Diabetes Education:  no   Plan:  Personalized Goals Goals Addressed            This Visit's Progress   . Exercise 3x per week (30 min per time)       06/03/2019 AWV Goal: Exercise for General Health   Patient will verbalize understanding of the benefits of increased physical activity:  Exercising regularly is important. It will improve your overall fitness, flexibility, and endurance.  Regular exercise also will improve your overall health. It can help you control your weight, reduce stress, and improve your bone density.  Over the next year, patient will increase physical activity as tolerated with a goal of at least 150 minutes of moderate physical activity per week.   You can tell that you are exercising at a moderate  intensity if your heart starts beating faster and you start breathing faster but can still hold a conversation.  Moderate-intensity exercise ideas include:  Walking 1 mile (1.6 km) in about 15 minutes  Biking  Hiking  Golfing  Dancing  Water aerobics  Patient will verbalize understanding of everyday activities that increase physical activity by providing examples like the following: ? Yard work, such as: ? Pushing a Conservation officer, nature ? Raking and bagging leaves ? Washing your car ? Pushing a stroller ? Shoveling snow ? Gardening ? Washing windows or floors  Patient will be able to explain general safety guidelines for exercising:   Before you start a new exercise program, talk with your health care provider.  Do not exercise so much that you hurt yourself, feel dizzy, or get very short of breath.  Wear comfortable clothes and wear shoes with good support.  Drink plenty of water while you exercise to prevent dehydration or heat stroke.  Work out until your breathing and your heartbeat get faster.     . Have 3 meals a day       06/03/2019 AWV Goal: Improved Nutrition/Diet  . Patient will verbalize understanding that diet plays an important role in overall health and that a poor diet is a risk factor for many chronic medical conditions.  . Over the next year, patient will improve self management of their diet by incorporating improved meal pattern, more consistent meal timing, better food choices, and eat 6 small meals per day. . Patient will utilize available community resources to help with food acquisition if needed (ex: food pantries, [address], etc) . Patient will work with nutrition specialist if a referral was made  Personalized Health Maintenance & Screening Recommendations  Pneumococcal vaccine  Diabetes screening Glaucoma screening Advanced directives: has NO advanced directive  - add't info requested. Referral to SW: no Shingrix  Lung Cancer Screening  Recommended: no (Low Dose CT Chest recommended if Age 59-80 years, 30 pack-year currently smoking OR have quit w/in past 15 years) Hepatitis C Screening recommended: Done HIV Screening recommended: no  Advanced Directives: Written information was prepared per patient's request.  Referrals & Orders No orders of the defined types were placed in this encounter.   Follow-up Plan . Follow-up with [name], DO as planned . Schedule Eye Exam . Patient to call GI to see if she can be seen sooner  . Patient to look over Advance Directive Packet and discuss with Family   I have personally reviewed and noted the following in the patient's chart:   . Medical and social history . Use of alcohol, tobacco or illicit drugs  . Current medications and supplements . Functional ability and status . Nutritional status . Physical activity . Advanced directives . List of other physicians . Hospitalizations, surgeries, and ER visits in previous 12 months . Vitals . Screenings to include cognitive, depression, and falls . Referrals and appointments  In addition, I have reviewed and discussed with Diane Grant certain preventive protocols, quality metrics, and best practice recommendations. A written personalized care plan for preventive services as well as general preventive health recommendations is available and can be mailed to the patient at her request.      Wardell Heath, LPN  [phone]    AVS Printed and Mailed to patient

## 2019-06-03 NOTE — Patient Instructions (Signed)
[facility] Maintenance Summary and Written Plan of Care  Diane Grant ,  Thank you for allowing me to perform your Medicare Annual Wellness Visit and for your ongoing commitment to your health.   Health Maintenance & Immunization History Health Maintenance  Topic Date Due  . COVID-19 Vaccine (1) Never done  . PNA vac Low Risk Adult (2 of 2 - PCV13) 03/28/2017  . INFLUENZA VACCINE  09/06/2019  . MAMMOGRAM  10/21/2020  . COLONOSCOPY  11/26/2023  . TETANUS/TDAP  06/14/2025  . DEXA SCAN  Completed  . Hepatitis C Screening  Completed   Immunization History  Administered Date(s) Administered  . Influenza, High Dose Seasonal PF 11/15/2015, 02/24/2017, 12/04/2017  . Influenza,inj,Quad PF,6+ Mos 02/19/2017  . Pneumococcal Polysaccharide-23 03/28/2016  . Tdap 06/15/2015    These are the patient goals that we discussed: Goals Addressed            This Visit's Progress   . Exercise 3x per week (30 min per time)       06/03/2019 AWV Goal: Exercise for General Health   Patient will verbalize understanding of the benefits of increased physical activity:  Exercising regularly is important. It will improve your overall fitness, flexibility, and endurance.  Regular exercise also will improve your overall health. It can help you control your weight, reduce stress, and improve your bone density.  Over the next year, patient will increase physical activity as tolerated with a goal of at least 150 minutes of moderate physical activity per week.   You can tell that you are exercising at a moderate intensity if your heart starts beating faster and you start breathing faster but can still hold a conversation.  Moderate-intensity exercise ideas include:  Walking 1 mile (1.6 km) in about 15 minutes  Biking  Hiking  Golfing  Dancing  Water aerobics  Patient will verbalize understanding of everyday activities that increase physical activity by providing  examples like the following: ? Yard work, such as: ? Pushing a Conservation officer, nature ? Raking and bagging leaves ? Washing your car ? Pushing a stroller ? Shoveling snow ? Gardening ? Washing windows or floors  Patient will be able to explain general safety guidelines for exercising:   Before you start a new exercise program, talk with your health care provider.  Do not exercise so much that you hurt yourself, feel dizzy, or get very short of breath.  Wear comfortable clothes and wear shoes with good support.  Drink plenty of water while you exercise to prevent dehydration or heat stroke.  Work out until your breathing and your heartbeat get faster.     . Have 3 meals a day       06/03/2019 AWV Goal: Improved Nutrition/Diet  . Patient will verbalize understanding that diet plays an important role in overall health and that a poor diet is a risk factor for many chronic medical conditions.  . Over the next year, patient will improve self management of their diet by incorporating improved meal pattern, more consistent meal timing, better food choices, and eat 6 small meals per day. . Patient will utilize available community resources to help with food acquisition if needed (ex: food pantries, [address], etc) . Patient will work with nutrition specialist if a referral was made         This is a list of Health Maintenance Items that are overdue or due now: Health Maintenance Due  Topic Date Due  . COVID-19 Vaccine (  1) Never done  . PNA vac Low Risk Adult (2 of 2 - PCV13) 03/28/2017     Orders/Referrals Placed Today: No orders of the defined types were placed in this encounter.  (Contact our referral department at [phone] if you have not spoken with someone about your referral appointment within the next 5 days)    Follow-up Plan Follow up with Dr. Lajuana Ripple as scheduled on 06/23/19 Call GI (Stomach Doctor) to see if they can get you in sooner that 5/20 Schedule Eye Exam Look  over Advance Directive Information given below and discuss with family   Advance Directive  Advance directives are legal documents that let you make choices ahead of time about your health care and medical treatment in case you become unable to communicate for yourself. Advance directives are a way for you to make known your wishes to family, friends, and health care providers. This can let others know about your end-of-life care if you become unable to communicate. Discussing and writing advance directives should happen over time rather than all at once. Advance directives can be changed depending on your situation and what you want, even after you have signed the advance directives. There are different types of advance directives, such as:  Medical power of attorney.  Living will.  Do not resuscitate (DNR) or do not attempt resuscitation (DNAR) order. Health care proxy and medical power of attorney A health care proxy is also called a health care agent. This is a person who is appointed to make medical decisions for you in cases where you are unable to make the decisions yourself. Generally, people choose someone they know well and trust to represent their preferences. Make sure to ask this person for an agreement to act as your proxy. A proxy may have to exercise judgment in the event of a medical decision for which your wishes are not known. A medical power of attorney is a legal document that names your health care proxy. Depending on the laws in your state, after the document is written, it may also need to be:  Signed.  Notarized.  Dated.  Copied.  Witnessed.  Incorporated into your medical record. You may also want to appoint someone to manage your money in a situation in which you are unable to do so. This is called a durable power of attorney for finances. It is a separate legal document from the durable power of attorney for health care. You may choose the same person or  someone different from your health care proxy to act as your agent in money matters. If you do not appoint a proxy, or if there is a concern that the proxy is not acting in your best interests, a court may appoint a guardian to act on your behalf. Living will A living will is a set of instructions that state your wishes about medical care when you cannot express them yourself. Health care providers should keep a copy of your living will in your medical record. You may want to give a copy to family members or friends. To alert caregivers in case of an emergency, you can place a card in your wallet to let them know that you have a living will and where they can find it. A living will is used if you become:  Terminally ill.  Disabled.  Unable to communicate or make decisions. Items to consider in your living will include:  To use or not to use life-support equipment, such as dialysis machines  and breathing machines (ventilators).  A DNR or DNAR order. This tells health care providers not to use cardiopulmonary resuscitation (CPR) if breathing or heartbeat stops.  To use or not to use tube feeding.  To be given or not to be given food and fluids.  Comfort (palliative) care when the goal becomes comfort rather than a cure.  Donation of organs and tissues. A living will does not give instructions for distributing your money and property if you should pass away. DNR or DNAR A DNR or DNAR order is a request not to have CPR in the event that your heart stops beating or you stop breathing. If a DNR or DNAR order has not been made and shared, a health care provider will try to help any patient whose heart has stopped or who has stopped breathing. If you plan to have surgery, talk with your health care provider about how your DNR or DNAR order will be followed if problems occur. What if I do not have an advance directive? If you do not have an advance directive, some states assign family decision  makers to act on your behalf based on how closely you are related to them. Each state has its own laws about advance directives. You may want to check with your health care provider, attorney, or state representative about the laws in your state. Summary  Advance directives are the legal documents that allow you to make choices ahead of time about your health care and medical treatment in case you become unable to tell others about your care.  The process of discussing and writing advance directives should happen over time. You can change the advance directives, even after you have signed them.  Advance directives include DNR or DNAR orders, living wills, and designating an agent as your medical power of attorney. This information is not intended to replace advice given to you by your health care provider. Make sure you discuss any questions you have with your health care provider. Document Revised: 08/21/2018 Document Reviewed: 08/21/2018 Elsevier Patient Education  [place].     Preventing Exposure to [employer], Adult Secondhand smoke is smoke that comes from burning tobacco. It includes smoke from cigarettes, pipes, or cigars. Being exposed to secondhand smoke is as dangerous as smoking. Common places you may be exposed to secondhand smoke include:  Work.  Public places, like restaurants, shopping centers, and parks.  Home, especially if you live in an apartment building. How can secondhand smoke affect me? There is no safe level of secondhand smoke. Smoke from cigarettes contains thousands of chemicals, including chemicals that are known to cause cancer. Secondhand smoke can cause many health problems, such as:  Cancer.  Heart disease.  Stroke.  Pregnancy problems, such as pregnancy loss (miscarriage), low birth weight, and early birth (premature). What actions can I take to prevent exposure to secondhand smoke?   Do not smoke.  Keep your home  smoke-free.  Do not allow smoking in your car.  Avoid public places where smoking is allowed.  Talk to your employer about your company's policies on smoking. ? Your workplace should have a policy separating smoking areas from nonsmoking areas. ? Smoking areas should have a system for ventilating and cleaning the air. Where to find more information Centers for Disease Control and Prevention (CDC): MLSLibrary.pl American Cancer Society: https://www.cancer.org American Heart Association: https://www.heart.org Summary  Secondhand smoke is smoke that comes from burning tobacco. Secondhand smoke exposes you to the dangers of smoking,  even if you are not the one smoking.  There is no safe level of secondhand smoke. Several chemicals in secondhand smoke are known to cause cancer. Secondhand smoke can also cause many other health problems.  To prevent exposure to secondhand smoke, do not smoke, discourage others from smoking, keep your home and car smoke-free, and avoid places where smoking is allowed. This information is not intended to replace advice given to you by your health care provider. Make sure you discuss any questions you have with your health care provider. Document Revised: 10/15/2018 Document Reviewed: 02/28/2017 Elsevier Patient Education  2020 [employer].

## 2019-06-04 ENCOUNTER — Encounter: Payer: Self-pay | Admitting: Family

## 2019-06-04 ENCOUNTER — Ambulatory Visit (INDEPENDENT_AMBULATORY_CARE_PROVIDER_SITE_OTHER): Payer: Medicare Other | Admitting: Family

## 2019-06-04 DIAGNOSIS — M159 Polyosteoarthritis, unspecified: Secondary | ICD-10-CM | POA: Diagnosis not present

## 2019-06-04 DIAGNOSIS — J301 Allergic rhinitis due to pollen: Secondary | ICD-10-CM

## 2019-06-04 MED ORDER — LEVOCETIRIZINE DIHYDROCHLORIDE 5 MG PO TABS: 5.0000 mg | ORAL_TABLET | Freq: Every evening | ORAL | 1 refills | Status: DC

## 2019-06-04 MED ORDER — FLUTICASONE PROPIONATE 50 MCG/ACT NA SUSP: 2.0000 | Freq: Every day | NASAL | 6 refills | Status: DC

## 2019-06-04 NOTE — Progress Notes (Signed)
   Virtual Visit via telephone Note Due to COVID-19 pandemic this visit was conducted virtually. This visit type was conducted due to national recommendations for restrictions regarding the COVID-19 Pandemic (e.g. social distancing, sheltering in place) in an effort to limit this patient's exposure and mitigate transmission in our community. All issues noted in this document were discussed and addressed.  A physical exam was not performed with this format.  I connected with Diane Grant on 06/04/19 at 12:20 pm by telephone and verified that I am speaking with the correct person using two identifiers. Diane Grant is currently located at home and husband is currently with her during visit. The provider, Evelina Dun, FNP is located in their office at time of visit.  I discussed the limitations, risks, security and privacy concerns of performing an evaluation and management service by telephone and the availability of in person appointments. I also discussed with the patient that there may be a patient responsible charge related to this service. The patient expressed understanding and agreed to proceed.    History and Present Illness:  Pt calls the office today with complaints of arthritis pain and allergic rhinitis. She reports she is having rhinorrhea, congestion, itching eyes,  And sneezing.  She reports she can not go outside. She has taken zyrtec in the past, but this did not work for her.   She reports she has not been taking her Mobic daily and trying to go with out them.  Arthritis Presents for follow-up visit. She complains of pain and stiffness. The symptoms have been stable. Affected locations include the right ankle, left ankle, left MCP and right MCP. Her pain is at a severity of 4/10 (8-9 out 10 when she "hits her hands or joints").      Review of Systems  Musculoskeletal: Positive for arthritis and stiffness.     Observations/Objective: No SOB or  distress noted   Assessment and Plan: Diane Grant comes in today with chief complaint of No chief complaint on file.   Diagnosis and orders addressed:  1. Allergic rhinitis due to pollen, unspecified seasonality Start Xyzal and flonase  Avoid allergens - levocetirizine (XYZAL) 5 MG tablet; Take 1 tablet (5 mg total) by mouth every evening.  Dispense: 90 tablet; Refill: 1 - fluticasone (FLONASE) 50 MCG/ACT nasal spray; Place 2 sprays into both nostrils daily.  Dispense: 16 g; Refill: 6  2. Osteoarthritis of multiple joints, unspecified osteoarthritis type -Start mobic daily Avoid other NSAID's     I discussed the assessment and treatment plan with the patient. The patient was provided an opportunity to ask questions and all were answered. The patient agreed with the plan and demonstrated an understanding of the instructions.   The patient was advised to call back or seek an in-person evaluation if the symptoms worsen or if the condition fails to improve as anticipated.  The above assessment and management plan was discussed with the patient. The patient verbalized understanding of and has agreed to the management plan. Patient is aware to call the clinic if symptoms persist or worsen. Patient is aware when to return to the clinic for a follow-up visit. Patient educated on when it is appropriate to go to the emergency department.   Time call ended: 12:33 pm   I provided 13 minutes of non-face-to-face time during this encounter.    Evelina Dun, FNP

## 2019-06-23 ENCOUNTER — Encounter: Payer: Self-pay | Admitting: Family Medicine

## 2019-06-23 ENCOUNTER — Ambulatory Visit (INDEPENDENT_AMBULATORY_CARE_PROVIDER_SITE_OTHER): Payer: Medicare Other | Admitting: Family Medicine

## 2019-06-23 ENCOUNTER — Other Ambulatory Visit: Payer: Self-pay

## 2019-06-23 VITALS — BP 109/71 | HR 77 | Temp 97.7°F | Ht 63.0 in | Wt 168.0 lb

## 2019-06-23 DIAGNOSIS — Z23 Encounter for immunization: Secondary | ICD-10-CM | POA: Diagnosis not present

## 2019-06-23 DIAGNOSIS — M159 Polyosteoarthritis, unspecified: Secondary | ICD-10-CM | POA: Diagnosis not present

## 2019-06-23 DIAGNOSIS — J301 Allergic rhinitis due to pollen: Secondary | ICD-10-CM

## 2019-06-23 DIAGNOSIS — E079 Disorder of thyroid, unspecified: Secondary | ICD-10-CM | POA: Diagnosis not present

## 2019-06-23 DIAGNOSIS — E876 Hypokalemia: Secondary | ICD-10-CM | POA: Diagnosis not present

## 2019-06-23 MED ORDER — MONTELUKAST SODIUM 10 MG PO TABS
10.0000 mg | ORAL_TABLET | Freq: Every day | ORAL | 3 refills | Status: DC
Start: 2019-06-23 — End: 2020-08-05

## 2019-06-23 MED ORDER — MELOXICAM 15 MG PO TABS
15.0000 mg | ORAL_TABLET | Freq: Every day | ORAL | 3 refills | Status: DC
Start: 2019-06-23 — End: 2020-08-05

## 2019-06-23 NOTE — Progress Notes (Signed)
Subjective: CC: Allergy/asthma/arthritis PCP: [name], DO [id number] Diane Grant is a 69 y.o. female presenting to clinic today for:  1.  Polyarthritis Patient reports ongoing joint pain in her hands and other joints.  While the meloxicam 7.5 mg did help slightly, she is not really noticed a huge difference.  She continues to have low back pain as well.  She does not know of any known family history of autoimmune disorders including rheumatoid arthritis and lupus arthritis.  She does not report any gross joint swelling, warmth.  No weakness.  She has used Voltaren gel topically as well with mild improvement.  2.  Allergic rhinitis/bronchospasm Patient has been evaluated by pulmonology previously and told that she does not have overt lung disease but that uncontrolled allergies could certainly precipitate bronchospasm.  She notes that she has ongoing allergic symptoms despite use of Flonase, Xyzal.  She often will feel that when she goes out into the yard she immediately has a sensation of throat irritation and difficulty taking a deep breath in.  She is wondering what else she can do to help with this.  3.  Hypothyroidism Patient reports compliance with Euthyrox 25 mcg daily.  Does not report any tremor, heart palpitations or unplanned weight changes.  ROS: Per HPI  Allergies  Allergen Reactions  . Anesthesia [id number] [Propofol]     Per patient need to be careful when giving this   Past Medical History:  Diagnosis Date  . Degenerative disc disease, lumbar   . Hypertension   . Thyroid disease    hypothyroidism    Current Outpatient Medications:  .  albuterol (VENTOLIN HFA) 108 (90 Base) MCG/ACT inhaler, Inhale 2 puffs into the lungs every 6 (six) hours as needed for wheezing or shortness of breath., Disp: 1 Inhaler, Rfl: 0 .  dicyclomine (BENTYL) 20 MG tablet, Take 20 mg by mouth 3 (three) times daily., Disp: , Rfl:  .  fluticasone (FLONASE) 50 MCG/ACT nasal spray,  Place 2 sprays into both nostrils daily., Disp: 16 g, Rfl: 6 .  hydrochlorothiazide (HYDRODIURIL) 25 MG tablet, Take 1 tablet (25 mg total) by mouth daily., Disp: 90 tablet, Rfl: 3 .  hydrOXYzine (ATARAX/VISTARIL) 10 MG tablet, Take 1 tablet (10 mg total) by mouth 3 (three) times daily as needed for itching., Disp: 40 tablet, Rfl: 0 .  levocetirizine (XYZAL) 5 MG tablet, Take 1 tablet (5 mg total) by mouth every evening., Disp: 90 tablet, Rfl: 1 .  levothyroxine (EUTHYROX) 75 MCG tablet, TAKE 1 TABLET BY MOUTH ONCE DAILY BEFORE BREAKFAST, Disp: 90 tablet, Rfl: 3 .  losartan (COZAAR) 100 MG tablet, Take 1 tablet (100 mg total) by mouth daily., Disp: 90 tablet, Rfl: 3 .  meclizine (ANTIVERT) 25 MG tablet, Take 1 tablet (25 mg total) by mouth 3 (three) times daily as needed for dizziness., Disp: 30 tablet, Rfl: 0 .  meloxicam (MOBIC) 7.5 MG tablet, Take 1 tablet (7.5 mg total) by mouth 2 (two) times daily as needed for pain., Disp: 180 tablet, Rfl: 0 .  methocarbamol (ROBAXIN) 500 MG tablet, Take 1 tablet (500 mg total) by mouth 4 (four) times daily as needed for muscle spasms., Disp: 40 tablet, Rfl: 0 .  omeprazole (PRILOSEC) 40 MG capsule, Take 40 mg by mouth daily., Disp: , Rfl:  .  Wheat Dextrin (BENEFIBER PO), Take by mouth., Disp: , Rfl:  Social History   Socioeconomic History  . Marital status: Married    Spouse name: Jeneen Rinks  . Number  of children: 5  . Years of education: 28  . Highest education level: High school graduate  Occupational History  . Not on file  Tobacco Use  . Smoking status: Never Smoker  . Smokeless tobacco: Never Used  Substance and Sexual Activity  . Alcohol use: No  . Drug use: No  . Sexual activity: Not Currently  Other Topics Concern  . Not on file  Social History Narrative   Lives with husband.     Social Determinants of Health   Financial Resource Strain: Low Risk   . Difficulty of Paying Living Expenses: Not hard at all  Food Insecurity: No Food  Insecurity  . Worried About Charity fundraiser in the Last Year: Never true  . Ran Out of Food in the Last Year: Never true  Transportation Needs: No Transportation Needs  . Lack of Transportation (Medical): No  . Lack of Transportation (Non-Medical): No  Physical Activity: Inactive  . Days of Exercise per Week: 0 days  . Minutes of Exercise per Session: 0 min  Stress: No Stress Concern Present  . Feeling of Stress : Not at all  Social Connections: Not Isolated  . Frequency of Communication with Friends and Family: More than three times a week  . Frequency of Social Gatherings with Friends and Family: Once a week  . Attends Religious Services: More than 4 times per year  . Active Member of Clubs or Organizations: Yes  . Attends Archivist Meetings: More than 4 times per year  . Marital Status: Married  Human resources officer Violence: Not At Risk  . Fear of Current or Ex-Partner: No  . Emotionally Abused: No  . Physically Abused: No  . Sexually Abused: No   Family History  Problem Relation Age of Onset  . Arthritis Mother   . Heart attack Father        50s MI  . Heart disease Father   . Asthma Sister   . Heart disease Brother   . Asthma Maternal Grandmother   . Heart attack Maternal Grandmother   . Cancer Brother        liver  . Healthy Daughter   . Healthy Daughter   . Healthy Daughter   . Healthy Daughter   . Healthy Daughter   . Thyroid cancer Neg Hx   . Colon cancer Neg Hx   . Breast cancer Neg Hx   . Prostate cancer Neg Hx   . Ovarian cancer Neg Hx     Objective: Office vital signs reviewed. BP 109/71   Pulse 77   Temp 97.7 F (36.5 C) (Temporal)   Ht 5\' 3"  (1.6 m)   Wt 168 lb (76.2 kg)   SpO2 99%   BMI 29.76 kg/m   Physical Examination:  General: Awake, alert, tired appearing, No acute distress HEENT: Normal; sclera white.  She does have mild erythema of the nasal turbinates bilaterally.  No lymphadenopathy. Cardio: regular rate and rhythm,  S1S2 heard, no murmurs appreciated Pulm: clear to auscultation bilaterally, no wheezes, rhonchi or rales; normal work of breathing on room air Extremities: warm, well perfused, No edema, cyanosis or clubbing; +2 pulses bilaterally MSK: normal gait and  Station; no gross joint swelling or deformities noted in the hands.  Assessment/ Plan: 69 y.o. female   1. Osteoarthritis of multiple joints, unspecified osteoarthritis type Check arthritis panel for possible autoimmune component.  I have increased her meloxicam to 15 mg daily.  Okay to continue topical Voltaren.  If symptoms do not improve with increased dose, may need to consider referral for injection therapy. - Arthritis Panel - ANA w/Reflex if Positive  2. Allergic rhinitis due to pollen, unspecified seasonality I have added Singulair 10 mg nightly.  She may continue other medications.  She will follow-up as needed on this issue  3. Hypokalemia Check potassium as she was slightly hypokalemic at last visit - Basic Metabolic Panel  4. Disorder of thyroid Check thyroid panel. - Thyroid Panel With TSH   No orders of the defined types were placed in this encounter.  No orders of the defined types were placed in this encounter.    [name], DO [place] [phone]

## 2019-06-24 ENCOUNTER — Other Ambulatory Visit: Payer: Self-pay | Admitting: Family Medicine

## 2019-06-24 DIAGNOSIS — E876 Hypokalemia: Secondary | ICD-10-CM

## 2019-06-24 LAB — ARTHRITIS PANEL
Basophils Absolute: 0 10*3/uL (ref 0.0–0.2)
Basos: 0 %
EOS (ABSOLUTE): 0.2 10*3/uL (ref 0.0–0.4)
Eos: 2 %
Hematocrit: 41.1 % (ref 34.0–46.6)
Hemoglobin: 13.5 g/dL (ref 11.1–15.9)
Immature Grans (Abs): 0 10*3/uL (ref 0.0–0.1)
Immature Granulocytes: 0 %
Lymphocytes Absolute: 2.1 10*3/uL (ref 0.7–3.1)
Lymphs: 24 %
MCH: 29.4 pg (ref 26.6–33.0)
MCHC: 32.8 g/dL (ref 31.5–35.7)
MCV: 90 fL (ref 79–97)
Monocytes Absolute: 0.6 10*3/uL (ref 0.1–0.9)
Monocytes: 7 %
Neutrophils Absolute: 5.8 10*3/uL (ref 1.4–7.0)
Neutrophils: 67 %
Platelets: 297 10*3/uL (ref 150–450)
RBC: 4.59 x10E6/uL (ref 3.77–5.28)
RDW: 12.5 % (ref 11.7–15.4)
Rheumatoid fact SerPl-aCnc: <10 IU/mL (ref 0.0–13.9)
Sed Rate: 7 mm/hr (ref 0–40)
Uric Acid: 4.1 mg/dL (ref 3.0–7.2)
WBC: 8.6 10*3/uL (ref 3.4–10.8)

## 2019-06-24 LAB — BASIC METABOLIC PANEL
BUN/Creatinine Ratio: 15 (ref 12–28)
BUN: 11 mg/dL (ref 8–27)
CO2: 26 mmol/L (ref 20–29)
Calcium: 9.5 mg/dL (ref 8.7–10.3)
Chloride: 104 mmol/L (ref 96–106)
Creatinine, Ser: 0.74 mg/dL (ref 0.57–1.00)
GFR calc Af Amer: 96 mL/min/{1.73_m2} (ref >59)
GFR calc non Af Amer: 83 mL/min/{1.73_m2} (ref >59)
Glucose: 94 mg/dL (ref 65–99)
Potassium: 3.3 mmol/L — ABNORMAL LOW (ref 3.5–5.2)
Sodium: 143 mmol/L (ref 134–144)

## 2019-06-24 LAB — THYROID PANEL WITH TSH
Free Thyroxine Index: 2.3 (ref 1.2–4.9)
T3 Uptake Ratio: 28 % (ref 24–39)
T4, Total: 8.3 ug/dL (ref 4.5–12.0)
TSH: 2.8 u[IU]/mL (ref 0.450–4.500)

## 2019-06-24 LAB — ANA W/REFLEX IF POSITIVE: Anti Nuclear Antibody (ANA): NEGATIVE

## 2019-06-24 MED ORDER — POTASSIUM CHLORIDE CRYS ER 20 MEQ PO TBCR: 20.0000 meq | EXTENDED_RELEASE_TABLET | Freq: Every day | ORAL | 3 refills | Status: DC

## 2019-06-25 DIAGNOSIS — Z86010 Personal history of colonic polyps: Secondary | ICD-10-CM | POA: Diagnosis not present

## 2019-06-25 DIAGNOSIS — R140 Abdominal distension (gaseous): Secondary | ICD-10-CM | POA: Diagnosis not present

## 2019-06-25 DIAGNOSIS — R1012 Left upper quadrant pain: Secondary | ICD-10-CM | POA: Diagnosis not present

## 2019-06-25 DIAGNOSIS — R1011 Right upper quadrant pain: Secondary | ICD-10-CM | POA: Diagnosis not present

## 2019-06-25 DIAGNOSIS — K219 Gastro-esophageal reflux disease without esophagitis: Secondary | ICD-10-CM | POA: Diagnosis not present

## 2019-07-03 ENCOUNTER — Telehealth: Payer: Self-pay | Admitting: *Deleted

## 2019-07-03 ENCOUNTER — Ambulatory Visit (INDEPENDENT_AMBULATORY_CARE_PROVIDER_SITE_OTHER): Payer: Medicare Other | Admitting: *Deleted

## 2019-07-03 ENCOUNTER — Other Ambulatory Visit: Payer: Self-pay | Admitting: Family Medicine

## 2019-07-03 DIAGNOSIS — I10 Essential (primary) hypertension: Secondary | ICD-10-CM

## 2019-07-03 DIAGNOSIS — E039 Hypothyroidism, unspecified: Secondary | ICD-10-CM | POA: Diagnosis not present

## 2019-07-03 DIAGNOSIS — K0889 Other specified disorders of teeth and supporting structures: Secondary | ICD-10-CM

## 2019-07-03 MED ORDER — LOSARTAN POTASSIUM-HCTZ 100-25 MG PO TABS: 1.0000 | ORAL_TABLET | Freq: Every day | ORAL | 3 refills | Status: DC

## 2019-07-03 NOTE — Telephone Encounter (Signed)
done

## 2019-07-03 NOTE — Telephone Encounter (Signed)
07/03/2019  Patient would like to losartan and HCTZ combined into one Hyzaar 100/25 prescription. Please send to [facility] in [place].  Will forward to PCP for review.  [name], BSN, RN-BC Embedded Chronic Care Manager [facility] Direct Dial: [phone]

## 2019-07-03 NOTE — Chronic Care Management (AMB) (Signed)
Chronic Care Management   Initial Visit Note  07/03/2019 Name: Diane Grant MRN: [id number] DOB: [birth year]  Referred by: [name], DO Reason for referral : Chronic Care Management (Initial Visit)   Diane Grant is a 69 y.o. year old female who is a primary care patient of [name], DO. The CCM team was consulted for assistance with chronic disease management and care coordination needs related to Hypertension, hypothyroidism, chronic back pain, and shortness of breath.   Review of patient status, including review of consultants reports, relevant laboratory and other test results, and collaboration with appropriate care team members and the patient's provider was performed as part of comprehensive patient evaluation and provision of chronic care management services.    Subjective: I spoke with [name] by telephone regarding management of her chronic medical conditions. She needs to reschedule her echo and stress test appointments and would like to see a dentist regarding poor dentition and gum pain/bleeding.   SDOH (Social Determinants of Health) assessments performed: Yes See Care Plan activities for detailed interventions related to SDOH    Objective: Outpatient Encounter Medications as of 07/03/2019  Medication Sig   albuterol (VENTOLIN HFA) 108 (90 Base) MCG/ACT inhaler Inhale 2 puffs into the lungs every 6 (six) hours as needed for wheezing or shortness of breath.   dicyclomine (BENTYL) 20 MG tablet Take 20 mg by mouth 3 (three) times daily.   fluticasone (FLONASE) 50 MCG/ACT nasal spray Place 2 sprays into both nostrils daily.   hydrochlorothiazide (HYDRODIURIL) 25 MG tablet Take 1 tablet (25 mg total) by mouth daily.   hydrOXYzine (ATARAX/VISTARIL) 10 MG tablet Take 1 tablet (10 mg total) by mouth 3 (three) times daily as needed for itching. (Patient not taking: Reported on 06/23/2019)   levocetirizine (XYZAL) 5 MG tablet Take  1 tablet (5 mg total) by mouth every evening.   levothyroxine (EUTHYROX) 75 MCG tablet TAKE 1 TABLET BY MOUTH ONCE DAILY BEFORE BREAKFAST   losartan (COZAAR) 100 MG tablet Take 1 tablet (100 mg total) by mouth daily.   meclizine (ANTIVERT) 25 MG tablet Take 1 tablet (25 mg total) by mouth 3 (three) times daily as needed for dizziness. (Patient not taking: Reported on 06/23/2019)   meloxicam (MOBIC) 15 MG tablet Take 1 tablet (15 mg total) by mouth daily. (ONLY ONCE DAILY if needed for joint/ back pain)   methocarbamol (ROBAXIN) 500 MG tablet Take 1 tablet (500 mg total) by mouth 4 (four) times daily as needed for muscle spasms.   montelukast (SINGULAIR) 10 MG tablet Take 1 tablet (10 mg total) by mouth at bedtime. (for allergy/ asthma)   omeprazole (PRILOSEC) 40 MG capsule Take 40 mg by mouth daily.   potassium chloride SA (KLOR-CON) 20 MEQ tablet Take 1 tablet (20 mEq total) by mouth daily.   Wheat Dextrin (BENEFIBER PO) Take by mouth.   No facility-administered encounter medications on file as of 07/03/2019.    BP Readings from Last 3 Encounters:  06/23/19 109/71  09/17/18 116/70  09/09/18 (!) 141/80    RN Care Plan    Chronic Disease Management Needs       CARE PLAN ENTRY (see longtitudinal plan of care for additional care plan information)  Current Barriers:   Chronic Disease Management support, education, and care coordination needs related to Hypertension, hypothyroidism  Clinical Goal(s) related to Hypertension, hypothyroidism:  Over the next 45 days, patient will:   Work with the care management team to address educational, disease management,  and care coordination needs   Begin or continue self health monitoring activities as directed today Measure and record blood pressure 4 times per week  Call provider office for new or worsened signs and symptoms Blood pressure findings outside established parameters and New or worsened symptom related to shortness of breath or  back pain.  Call care management team with questions or concerns  Verbalize basic understanding of patient centered plan of care established today  Interventions related to Hypertension, hypothyroidism:   Evaluation of current treatment plans and patient's adherence to plan as established by provider  Assessed patient understanding of disease states  Assessed patient's education and care coordination needs  Provided disease specific education to patient   Collaborated with appropriate clinical care team members regarding patient needs  Chart reviewed including recent office notes, mediation list, lab results, and imaging studies  Provided with RNCM direct contact number and encouraged to reach out as needed  Discussed need for echo and stress test that was recommended by cardiology last year  Encouraged patient to f/u with cardio office regarding scheduling. Offered to assist care coordination but patient stated that her daughter would call to schedule.   Reviewed and discussed medications o Patient would like to combine losartan and HCTZ into Hyzaar 100/25 o Telephone message with this request sent to PCP  Discussed dental concerns o Patient has some cavities and gum soreness. Has not seen a dentist in years and would like to o Care guide referral placed for assistance with locating an affordable dentist  Discussed chronic back pain and management of symptoms  Patient Self Care Activities related to Hypertension, hypothyroidism:   Patient is able to perform ADLs and IADLs independently   Initial goal documentation         Plan:   The care management team will reach out to the patient again over the next 45 days.   [name], BSN, RN-BC Embedded Chronic Care Manager [facility] / [place] [facility] Direct Dial: [phone]

## 2019-07-03 NOTE — Patient Instructions (Signed)
Visit Information  Goals Addressed            This Visit's Progress   . Chronic Disease Management Needs       CARE PLAN ENTRY (see longtitudinal plan of care for additional care plan information)  Current Barriers:  . Chronic Disease Management support, education, and care coordination needs related to Hypertension, hypothyroidism  Clinical Goal(s) related to Hypertension, hypothyroidism:  Over the next 45 days, patient will:  . Work with the care management team to address educational, disease management, and care coordination needs  . Begin or continue self health monitoring activities as directed today Measure and record blood pressure 4 times per week . Call provider office for new or worsened signs and symptoms Blood pressure findings outside established parameters and New or worsened symptom related to shortness of breath or back pain. . Call care management team with questions or concerns . Verbalize basic understanding of patient centered plan of care established today  Interventions related to Hypertension, hypothyroidism:  . Evaluation of current treatment plans and patient's adherence to plan as established by provider . Assessed patient understanding of disease states . Assessed patient's education and care coordination needs . Provided disease specific education to patient  . Collaborated with appropriate clinical care team members regarding patient needs . Chart reviewed including recent office notes, mediation list, lab results, and imaging studies . Provided with RNCM direct contact number and encouraged to reach out as needed . Discussed need for echo and stress test that was recommended by cardiology last year . Encouraged patient to f/u with cardio office regarding scheduling. Offered to assist care coordination but patient stated that her daughter would call to schedule.  . Reviewed and discussed medications o Patient would like to combine losartan and HCTZ into  Hyzaar 100/25 o Telephone message with this request sent to PCP . Discussed dental concerns o Patient has some cavities and gum soreness. Has not seen a dentist in years and would like to o Care guide referral placed for assistance with locating an affordable dentist . Discussed chronic back pain and management of symptoms  Patient Self Care Activities related to Hypertension, hypothyroidism:  . Patient is able to perform ADLs and IADLs independently   Initial goal documentation        Diane Grant was given information about Chronic Care Management services today including:  1. CCM service includes personalized support from designated clinical staff supervised by her physician, including individualized plan of care and coordination with other care providers 2. 24/7 contact phone numbers for assistance for urgent and routine care needs. 3. Service will only be billed when office clinical staff spend 20 minutes or more in a month to coordinate care. 4. Only one practitioner may furnish and bill the service in a calendar month. 5. The patient may stop CCM services at any time (effective at the end of the month) by phone call to the office staff. 6. The patient will be responsible for cost sharing (co-pay) of up to 20% of the service fee (after annual deductible is met).  Patient agreed to services and verbal consent obtained.   The patient verbalized understanding of instructions provided today and declined a print copy of patient instruction materials.   The care management team will reach out to the patient again over the next 45 days.   [name], BSN, RN-BC Embedded Chronic Care Manager [facility] / [place] [facility] Direct Dial: [phone]

## 2019-07-10 DIAGNOSIS — R140 Abdominal distension (gaseous): Secondary | ICD-10-CM | POA: Diagnosis not present

## 2019-07-10 DIAGNOSIS — R109 Unspecified abdominal pain: Secondary | ICD-10-CM | POA: Diagnosis not present

## 2019-07-10 DIAGNOSIS — R1011 Right upper quadrant pain: Secondary | ICD-10-CM | POA: Diagnosis not present

## 2019-07-10 DIAGNOSIS — R1012 Left upper quadrant pain: Secondary | ICD-10-CM | POA: Diagnosis not present

## 2019-07-10 DIAGNOSIS — K760 Fatty (change of) liver, not elsewhere classified: Secondary | ICD-10-CM | POA: Diagnosis not present

## 2019-07-21 ENCOUNTER — Telehealth: Payer: Self-pay

## 2019-07-21 NOTE — Telephone Encounter (Signed)
07/21/2019 Spoke with patient about the [facility] and dental clinics in [place]. Diane Grant [phone]

## 2019-07-27 ENCOUNTER — Telehealth: Payer: Self-pay

## 2019-07-27 NOTE — Telephone Encounter (Signed)
07/27/19 Follow-up call. Left message on voicemail for patient to return my call regarding dental clinics. Ambrose Mantle [phone]

## 2019-07-28 ENCOUNTER — Telehealth: Payer: Self-pay

## 2019-07-28 NOTE — Telephone Encounter (Signed)
07/28/19 Spoke with patient, she plans on callling the dental resources.  Explained again that the dental resources given serve [place] and [employer].  There are no free dental clinics in [facility].  There are no other resources needed at this time. Closing referral.  Andree Elk [phone]

## 2019-08-25 ENCOUNTER — Other Ambulatory Visit: Payer: Self-pay | Admitting: *Deleted

## 2019-08-25 ENCOUNTER — Ambulatory Visit (INDEPENDENT_AMBULATORY_CARE_PROVIDER_SITE_OTHER): Payer: Medicare Other | Admitting: *Deleted

## 2019-08-25 DIAGNOSIS — I10 Essential (primary) hypertension: Secondary | ICD-10-CM

## 2019-08-25 DIAGNOSIS — E039 Hypothyroidism, unspecified: Secondary | ICD-10-CM | POA: Diagnosis not present

## 2019-08-25 DIAGNOSIS — K0889 Other specified disorders of teeth and supporting structures: Secondary | ICD-10-CM

## 2019-08-26 NOTE — Patient Instructions (Signed)
Visit Information  Goals Addressed              This Visit's Progress     Patient Stated   .  "I need to manage constipation" (pt-stated)        CARE PLAN ENTRY (see longitudinal plan of care for additional care plan information)  Current Barriers:  . Care Coordination needs related to constipation in a patient with hypothyroidism and diverticulosis (disease states)  Nurse Case Manager Clinical Goal(s):  Marland Kitchen Over the next 60 days, patient will take medications as directed to help relieve constipation . Over the next 90 days, patient will keep all scheduled medical appointments  Interventions:  . Inter-disciplinary care team collaboration (see longitudinal plan of care) . Evaluation of current treatment plan related to constipation and patient's adherence to plan as established by provider. . Reviewed medications with patient and discussed benefiber and Miralax . Discussed diet and water intake  . Discussed plans with patient for ongoing care management follow up and provided patient with direct contact information for care management team  Patient Self Care Activities:  . Performs ADL's independently . Performs IADL's independently  Initial goal documentation     .  "I need to see a dentist" (pt-stated)        [place] (see longitudinal plan of care for additional care plan information)  Current Barriers:  . Care Coordination needs related to dental care in a patient with hypertension and hypothyroidism (disease states)  Nurse Case Manager Clinical Goal(s):  Marland Kitchen Over the next 2 weeks, patient will have an appointment with a dentist to provide care regarding cavities and sore gums  Interventions:  . Inter-disciplinary care team collaboration (see longitudinal plan of care) . Chart reviewed . Previously referred patient to [place] for assistance with finding an affordable dentist . Talked with patient by telephone o She was connected with dental clinic and  has an appointment next week . Encouraged patient to reach out to [facility] as needed  Patient Self Care Activities:  . Performs ADL's independently . Performs IADL's independently  Initial goal documentation       Other   .  Patient Stated         Patient verbalizes understanding of instructions provided today.   Follow-up Plan The care management team will reach out to the patient again over the next 60 days.   [name], BSN, RN-BC Embedded Chronic Care Manager [facility] / [place] [facility] Direct Dial: [phone]

## 2019-08-26 NOTE — Chronic Care Management (AMB) (Addendum)
Chronic Care Management   Follow Up Note   08/25/2019 Name: Diane Grant MRN: [id number] DOB: [birth year]  Referred by: [name], DO Reason for referral : Chronic Care Management (RN Follow up)   Diane Grant is a 69 y.o. year old female who is a primary care patient of [name], DO. The CCM team was consulted for assistance with chronic disease management and care coordination needs.    Review of patient status, including review of consultants reports, relevant laboratory and other test results, and collaboration with appropriate care team members and the patient's provider was performed as part of comprehensive patient evaluation and provision of chronic care management services.    SDOH (Social Determinants of Health) assessments performed: No See Care Plan activities for detailed interventions related to [name]     Outpatient Encounter Medications as of 08/25/2019  Medication Sig  . albuterol (VENTOLIN HFA) 108 (90 Base) MCG/ACT inhaler Inhale 2 puffs into the lungs every 6 (six) hours as needed for wheezing or shortness of breath.  . dicyclomine (BENTYL) 20 MG tablet Take 20 mg by mouth 3 (three) times daily.  . fluticasone (FLONASE) 50 MCG/ACT nasal spray Place 2 sprays into both nostrils daily.  . hydrOXYzine (ATARAX/VISTARIL) 10 MG tablet Take 1 tablet (10 mg total) by mouth 3 (three) times daily as needed for itching. (Patient not taking: Reported on 06/23/2019)  . levocetirizine (XYZAL) 5 MG tablet Take 1 tablet (5 mg total) by mouth every evening.  Marland Kitchen levothyroxine (EUTHYROX) 75 MCG tablet TAKE 1 TABLET BY MOUTH ONCE DAILY BEFORE BREAKFAST  . losartan-hydrochlorothiazide (HYZAAR) 100-25 MG tablet Take 1 tablet by mouth daily.  . meclizine (ANTIVERT) 25 MG tablet Take 1 tablet (25 mg total) by mouth 3 (three) times daily as needed for dizziness. (Patient not taking: Reported on 06/23/2019)  . meloxicam (MOBIC) 15 MG tablet Take 1 tablet  (15 mg total) by mouth daily. (ONLY ONCE DAILY if needed for joint/ back pain)  . methocarbamol (ROBAXIN) 500 MG tablet Take 1 tablet (500 mg total) by mouth 4 (four) times daily as needed for muscle spasms.  . montelukast (SINGULAIR) 10 MG tablet Take 1 tablet (10 mg total) by mouth at bedtime. (for allergy/ asthma)  . omeprazole (PRILOSEC) 40 MG capsule Take 40 mg by mouth daily.  . potassium chloride SA (KLOR-CON) 20 MEQ tablet Take 1 tablet (20 mEq total) by mouth daily.  . Wheat Dextrin (BENEFIBER PO) Take by mouth.   No facility-administered encounter medications on file as of 08/25/2019.     RN Care Plan   .  "I need to manage constipation" (pt-stated)        CARE PLAN ENTRY (see longitudinal plan of care for additional care plan information)  Current Barriers:  . Care Coordination needs related to constipation in a patient with hypothyroidism and diverticulosis (disease states)  Nurse Case Manager Clinical Goal(s):  Marland Kitchen Over the next 60 days, patient will take medications as directed to help relieve constipation . Over the next 90 days, patient will keep all scheduled medical appointments  Interventions:  . Inter-disciplinary care team collaboration (see longitudinal plan of care) . Evaluation of current treatment plan related to constipation and patient's adherence to plan as established by provider. . Reviewed medications with patient and discussed benefiber and Miralax . Discussed diet and water intake  . Discussed plans with patient for ongoing care management follow up and provided patient with direct contact information for care management team  Patient Self Care Activities:  . Performs ADL's independently . Performs IADL's independently  Initial goal documentation     .  "I need to see a dentist" (pt-stated)        [place] (see longitudinal plan of care for additional care plan information)  Current Barriers:  . Care Coordination needs related to dental  care in a patient with hypertension and hypothyroidism (disease states)  Nurse Case Manager Clinical Goal(s):  Marland Kitchen Over the next 2 weeks, patient will have an appointment with a dentist to provide care regarding cavities and sore gums  Interventions:  . Inter-disciplinary care team collaboration (see longitudinal plan of care) . Chart reviewed . Previously referred patient to [place] for assistance with finding an affordable dentist . Talked with patient by telephone o She was connected with dental clinic and has an appointment next week . Encouraged patient to reach out to [facility] as needed  Patient Self Care Activities:  . Performs ADL's independently . Performs IADL's independently  Initial goal documentation         Plan:  The care management team will reach out to the patient again over the next 60 days.   [name], BSN, RN-BC Embedded Chronic Care Manager [facility] / [place] [facility] Direct Dial: [phone]

## 2019-08-28 DIAGNOSIS — Z20822 Contact with and (suspected) exposure to covid-19: Secondary | ICD-10-CM | POA: Diagnosis not present

## 2019-09-28 ENCOUNTER — Other Ambulatory Visit: Payer: Self-pay | Admitting: Family Medicine

## 2019-10-09 DIAGNOSIS — R1084 Generalized abdominal pain: Secondary | ICD-10-CM | POA: Diagnosis not present

## 2019-10-09 DIAGNOSIS — Z86010 Personal history of colonic polyps: Secondary | ICD-10-CM | POA: Diagnosis not present

## 2019-10-09 DIAGNOSIS — R1032 Left lower quadrant pain: Secondary | ICD-10-CM | POA: Diagnosis not present

## 2019-10-09 DIAGNOSIS — K219 Gastro-esophageal reflux disease without esophagitis: Secondary | ICD-10-CM | POA: Diagnosis not present

## 2019-10-09 DIAGNOSIS — R140 Abdominal distension (gaseous): Secondary | ICD-10-CM | POA: Diagnosis not present

## 2019-11-09 ENCOUNTER — Other Ambulatory Visit: Payer: Self-pay | Admitting: Family Medicine

## 2019-11-09 DIAGNOSIS — Z1231 Encounter for screening mammogram for malignant neoplasm of breast: Secondary | ICD-10-CM

## 2019-11-27 ENCOUNTER — Encounter: Payer: Self-pay | Admitting: Nurse Practitioner

## 2019-11-27 ENCOUNTER — Ambulatory Visit (INDEPENDENT_AMBULATORY_CARE_PROVIDER_SITE_OTHER): Payer: Medicare Other | Admitting: Nurse Practitioner

## 2019-11-27 DIAGNOSIS — L509 Urticaria, unspecified: Secondary | ICD-10-CM | POA: Insufficient documentation

## 2019-11-27 MED ORDER — METHYLPREDNISOLONE ACETATE 80 MG/ML IJ SUSP
80.0000 mg | Freq: Once | INTRAMUSCULAR | Status: AC
  Administered 2019-11-27: 80 mg via INTRAMUSCULAR

## 2019-11-27 MED ORDER — PREDNISONE 10 MG (21) PO TBPK: ORAL_TABLET | ORAL | 0 refills | Status: DC

## 2019-11-27 NOTE — Patient Instructions (Addendum)
Depo-Medrol shot given, prednisone pack, anti-inflammatory for pain.  Take all medication as prescribed.  Avoid future trigger.  Follow-up with worsening or unresolved symptoms.  Hives Hives are itchy, red, swollen areas on your skin. Hives can show up on any part of your body. Hives often fade within 24 hours (acute hives). New hives can show up after old ones fade. This can go on for many days or weeks (chronic hives). Hives do not spread from person to person (are not contagious). Hives are caused by your body's response to something that you are allergic to (allergen). These are sometimes called triggers. You can get hives right after being around a trigger, or hours later. What are the causes?  Allergies to foods.  Insect bites or stings.  Pollen.  Pets.  Latex.  Chemicals.  Spending time in sunlight, heat, or cold.  Exercise.  Stress.  Some medicines.  Viruses. This includes the common cold.  Infections caused by germs (bacteria).  Allergy shots.  Blood transfusions. Sometimes, the cause is not known. What increases the risk?  Being a woman.  Being allergic to foods such as: ? Citrus fruits. ? Milk. ? Eggs. ? Peanuts. ? Tree nuts. ? Shellfish.  Being allergic to: ? Medicines. ? Latex. ? Insects. ? Animals. ? Pollen. What are the signs or symptoms?   Raised, itchy, red or white bumps or patches on your skin. These areas may: ? Get large and swollen. ? Change in shape and location. ? Stand alone or connect to each other over a large area of skin. ? Sting or hurt. ? Turn white when pressed in the center (blanch). In very bad cases, your hands, feet, and face may also get swollen. This may happen if hives start deeper in your skin. How is this treated? Treatment for this condition depends on your symptoms. Treatment may include:  Using cool, wet cloths (cool compresses) or taking cool showers to stop the itching.  Medicines that help: ? Relieve  itching (antihistamines). ? Reduce swelling (corticosteroids). ? Treat infection (antibiotics).  A medicine (omalizumab) that is given as a shot (injection). Your doctor may prescribe this if you have hives that do not get better even after other treatments.  In very bad cases, you may need a shot of a medicine called epinephrineto prevent a life-threatening allergic reaction (anaphylaxis). Follow these instructions at home: Medicines  Take or apply over-the-counter and prescription medicines only as told by your doctor.  If you were prescribed an antibiotic medicine, use it as told by your doctor. Do not stop using it even if you start to feel better. Skin care  Apply cool, wet cloths to the hives.  Do not scratch your skin. Do not rub your skin. General instructions  Do not take hot showers or baths. This can make itching worse.  Do not wear tight clothes.  Use sunscreen and wear clothes that cover your skin when you are outside.  Avoid any triggers that cause your hives. Keep a journal to help track what causes your hives. Write down: ? What medicines you take. ? What you eat and drink. ? What products you use on your skin.  Keep all follow-up visits as told by your doctor. This is important. Contact a doctor if:  Your symptoms are not better with medicine.  Your joints hurt or are swollen. Get help right away if:  You have a fever.  You have pain in your belly (abdomen).  Your tongue or lips are  swollen.  Your eyelids are swollen.  Your chest or throat feels tight.  You have trouble breathing or swallowing. These symptoms may be an emergency. Do not wait to see if the symptoms will go away. Get medical help right away. Call your local emergency services (911 in the [place] Do not drive yourself to the hospital. Summary  Hives are itchy, red, swollen areas on your skin.  Treatment for this condition depends on your symptoms.  Avoid things that cause your  hives. Keep a journal to help track what causes your hives.  Take and apply over-the-counter and prescription medicines only as told by your doctor.  Keep all follow-up visits as told by your doctor. This is important. This information is not intended to replace advice given to you by your health care provider. Make sure you discuss any questions you have with your health care provider. Document Revised: 08/07/2017 Document Reviewed: 08/07/2017 Elsevier Patient Education  [place].

## 2019-11-27 NOTE — Assessment & Plan Note (Signed)
Patient is a 69 year old female who is seen via telephone for hives/rash.  Symptoms appeared a few days ago.  Patient reports gardening and was exposed to some grass.  Patient reports this is not new.  Started patient on antihistamine cream over-the-counter, prednisone pack taper, anti-inflammatory over-the-counter. Depo-Medrol shot given. Provided education with printed handouts given. Follow-up with worsening or unresolved symptoms.

## 2019-11-27 NOTE — Progress Notes (Signed)
° °  Virtual Visit via telephone Note Due to COVID-19 pandemic this visit was conducted virtually. This visit type was conducted due to national recommendations for restrictions regarding the COVID-19 Pandemic (e.g. social distancing, sheltering in place) in an effort to limit this patient's exposure and mitigate transmission in our community. All issues noted in this document were discussed and addressed.  A physical exam was not performed with this format.  I connected with Diane Grant on 11/27/19 at 2;20 pm  by telephone and verified that I am speaking with the correct person using two identifiers. Diane Grant is currently located at at home and husband is currently with patient during visit. The provider, Ivy Lynn, NP is located in their office at time of visit.  I discussed the limitations, risks, security and privacy concerns of performing an evaluation and management service by telephone and the availability of in person appointments. I also discussed with the patient that there may be a patient responsible charge related to this service. The patient expressed understanding and agreed to proceed.   History and Present Illness:  Rash This is a recurrent problem. The current episode started in the past 7 days. The problem is unchanged. Location: BUE/BLE. The rash is characterized by itchiness and redness. Associated with: Grass. Pertinent negatives include no cough, facial edema, fever or shortness of breath. Past treatments include antihistamine. The treatment provided no relief.      Review of Systems  Constitutional: Negative for fever.  Respiratory: Negative for cough and shortness of breath.   Skin: Positive for rash.     Observations/Objective: Tele phone visit  Assessment and Plan: Hives Patient is a 69 year old female who is seen via telephone for hives/rash.  Symptoms appeared a few days ago.  Patient reports gardening and was exposed to  some grass.  Patient reports this is not new.  Started patient on antihistamine cream over-the-counter, prednisone pack taper, anti-inflammatory over-the-counter. Depo-Medrol shot given. Provided education with printed handouts given. Follow-up with worsening or unresolved symptoms.  Follow Up Instructions: Unresolved or worsening symptoms.    I discussed the assessment and treatment plan with the patient. The patient was provided an opportunity to ask questions and all were answered. The patient agreed with the plan and demonstrated an understanding of the instructions.   The patient was advised to call back or seek an in-person evaluation if the symptoms worsen or if the condition fails to improve as anticipated.  The above assessment and management plan was discussed with the patient. The patient verbalized understanding of and has agreed to the management plan. Patient is aware to call the clinic if symptoms persist or worsen. Patient is aware when to return to the clinic for a follow-up visit. Patient educated on when it is appropriate to go to the emergency department.   Time call ended:  2:08 pm  I provided 8 minutes of non-face-to-face time during this encounter.    Ivy Lynn, NP

## 2019-11-27 NOTE — Addendum Note (Signed)
Addended by: Michaela Corner on: 11/27/2019 03:16 PM   Modules accepted: Orders

## 2019-12-09 ENCOUNTER — Other Ambulatory Visit: Payer: Self-pay | Admitting: Family Medicine

## 2019-12-09 DIAGNOSIS — J9809 Other diseases of bronchus, not elsewhere classified: Secondary | ICD-10-CM

## 2019-12-14 ENCOUNTER — Ambulatory Visit (INDEPENDENT_AMBULATORY_CARE_PROVIDER_SITE_OTHER): Payer: Medicare Other | Admitting: Family Medicine

## 2019-12-14 ENCOUNTER — Encounter: Payer: Self-pay | Admitting: Family Medicine

## 2019-12-14 DIAGNOSIS — J329 Chronic sinusitis, unspecified: Secondary | ICD-10-CM | POA: Diagnosis not present

## 2019-12-14 DIAGNOSIS — J40 Bronchitis, not specified as acute or chronic: Secondary | ICD-10-CM

## 2019-12-14 MED ORDER — AZITHROMYCIN 250 MG PO TABS: ORAL_TABLET | ORAL | 0 refills | Status: DC

## 2019-12-14 MED ORDER — PREDNISONE 10 MG PO TABS
ORAL_TABLET | ORAL | 0 refills | Status: DC
Start: 2019-12-14 — End: 2020-03-29

## 2019-12-14 MED ORDER — AZITHROMYCIN 250 MG PO TABS
ORAL_TABLET | ORAL | 0 refills | Status: DC
Start: 2019-12-14 — End: 2020-03-29

## 2019-12-14 MED ORDER — PREDNISONE 10 MG PO TABS: ORAL_TABLET | ORAL | 0 refills | Status: DC

## 2019-12-14 NOTE — Progress Notes (Signed)
Subjective:    Patient ID: Diane Grant, female    DOB: Sep 14, 1950, 69 y.o.   MRN: [id number]   HPI: Diane Grant is a 69 y.o. female presenting for problems with breathing. Heart running away with her off and on. Pressure on left chest. Onset one week ago. Feeling sinus drainage. . Short winded for over a week. Had an inhaler, but it was expired.    Depression screen [facility] 2/9 06/23/2019 06/03/2019 06/02/2018 04/07/2018 03/17/2018  Decreased Interest 0 0 0 0 0  Down, Depressed, Hopeless 0 0 0 0 0  PHQ - 2 Score 0 0 0 0 0  Altered sleeping 0 - - - -  Tired, decreased energy 0 - - - -  Change in appetite 0 - - - -  Feeling bad or failure about yourself  0 - - - -  Trouble concentrating 0 - - - -  Moving slowly or fidgety/restless 0 - - - -  Suicidal thoughts 0 - - - -  PHQ-9 Score 0 - - - -     Relevant past medical, surgical, family and social history reviewed and updated as indicated.  Interim medical history since our last visit reviewed. Allergies and medications reviewed and updated.  ROS:  Review of Systems  Constitutional: Negative for appetite change, chills, diaphoresis, fatigue and fever.  HENT: Positive for congestion. Negative for ear pain, hearing loss, postnasal drip, rhinorrhea, sore throat and trouble swallowing.   Respiratory: Positive for cough, shortness of breath and wheezing. Negative for chest tightness.   Cardiovascular: Positive for chest pain and palpitations.  Gastrointestinal: Negative for abdominal pain.  Musculoskeletal: Negative for arthralgias.  Skin: Negative for rash.     Social History   Tobacco Use  Smoking Status Never Smoker  Smokeless Tobacco Never Used       Objective:     Wt Readings from Last 3 Encounters:  06/23/19 168 lb (76.2 kg)  06/03/19 157 lb 13.6 oz (71.6 kg)  09/17/18 157 lb 12.8 oz (71.6 kg)     Exam deferred. Pt. Harboring due to COVID 19. Phone visit performed.   Assessment & Plan:    1. Sinobronchitis     Meds ordered this encounter  Medications  . DISCONTD: azithromycin (ZITHROMAX Z-PAK) 250 MG tablet    Sig: Take two right away Then one a day for the next 4 days.    Dispense:  6 each    Refill:  0  . DISCONTD: predniSONE (DELTASONE) 10 MG tablet    Sig: Take 5 daily for 3 days followed by 4,3,2 and 1 for 3 days each.    Dispense:  45 tablet    Refill:  0  . azithromycin (ZITHROMAX Z-PAK) 250 MG tablet    Sig: Take two right away Then one a day for the next 4 days.    Dispense:  6 each    Refill:  0  . predniSONE (DELTASONE) 10 MG tablet    Sig: Take 5 daily for 3 days followed by 4,3,2 and 1 for 3 days each.    Dispense:  45 tablet    Refill:  0    Orders Placed This Encounter  Procedures  . Novel Coronavirus, NAA ([facility])    Order Specific Question:   Is this test for diagnosis or screening    Answer:   Diagnosis of ill patient    Order Specific Question:   Symptomatic for COVID-19 as defined by [facility]  Answer:   Yes    Order Specific Question:   Date of Symptom Onset    Answer:   12/07/2019    Order Specific Question:   Hospitalized for COVID-19    Answer:   No    Order Specific Question:   Admitted to ICU for COVID-19    Answer:   No    Order Specific Question:   Previously tested for COVID-19    Answer:   Yes    Order Specific Question:   Resident in a congregate (group) care setting    Answer:   No    Order Specific Question:   Is the patient student?    Answer:   No    Order Specific Question:   Employed in healthcare setting    Answer:   No    Order Specific Question:   Pregnant    Answer:   No    Order Specific Question:   Has patient completed COVID vaccination(s) (2 doses of Pfizer/Moderna 1 dose of [employer])    Answer:   Yes      Diagnoses and all orders for this visit:  Sinobronchitis -     Novel Coronavirus, NAA ([facility])  Other orders -     Discontinue: azithromycin (ZITHROMAX Z-PAK) 250 MG tablet; Take two  right away Then one a day for the next 4 days. -     Discontinue: predniSONE (DELTASONE) 10 MG tablet; Take 5 daily for 3 days followed by 4,3,2 and 1 for 3 days each. -     azithromycin (ZITHROMAX Z-PAK) 250 MG tablet; Take two right away Then one a day for the next 4 days. -     predniSONE (DELTASONE) 10 MG tablet; Take 5 daily for 3 days followed by 4,3,2 and 1 for 3 days each.    Virtual Visit via telephone Note  I discussed the limitations, risks, security and privacy concerns of performing an evaluation and management service by telephone and the availability of in person appointments. The patient was identified with two identifiers. Pt.expressed understanding and agreed to proceed. Pt. Is at home. Dr. Livia Snellen is in his office.  Follow Up Instructions:   I discussed the assessment and treatment plan with the patient. The patient was provided an opportunity to ask questions and all were answered. The patient agreed with the plan and demonstrated an understanding of the instructions.   The patient was advised to call back or seek an in-person evaluation if the symptoms worsen or if the condition fails to improve as anticipated.   Total minutes including chart review and phone contact time: 17   Follow up plan: No follow-ups on file.  Claretta Fraise, MD [place]

## 2019-12-15 DIAGNOSIS — J40 Bronchitis, not specified as acute or chronic: Secondary | ICD-10-CM | POA: Diagnosis not present

## 2019-12-15 DIAGNOSIS — J329 Chronic sinusitis, unspecified: Secondary | ICD-10-CM | POA: Diagnosis not present

## 2019-12-15 DIAGNOSIS — Z03818 Encounter for observation for suspected exposure to other biological agents ruled out: Secondary | ICD-10-CM | POA: Diagnosis not present

## 2019-12-16 LAB — NOVEL CORONAVIRUS, NAA: SARS-CoV-2, NAA: NOT DETECTED

## 2019-12-16 LAB — SARS-COV-2, NAA 2 DAY TAT

## 2020-01-28 DIAGNOSIS — Z03818 Encounter for observation for suspected exposure to other biological agents ruled out: Secondary | ICD-10-CM | POA: Diagnosis not present

## 2020-02-01 ENCOUNTER — Other Ambulatory Visit: Payer: Self-pay | Admitting: *Deleted

## 2020-02-01 DIAGNOSIS — R42 Dizziness and giddiness: Secondary | ICD-10-CM

## 2020-02-01 MED ORDER — MECLIZINE HCL 25 MG PO TABS: 25.0000 mg | ORAL_TABLET | Freq: Three times a day (TID) | ORAL | 0 refills | Status: DC | PRN

## 2020-02-03 ENCOUNTER — Ambulatory Visit
Admission: RE | Admit: 2020-02-03 | Discharge: 2020-02-03 | Disposition: A | Discharge status: Home / Self Care | Payer: Medicare Other | Source: Ambulatory Visit | Attending: Family Medicine | Admitting: Family Medicine

## 2020-02-03 DIAGNOSIS — Z1231 Encounter for screening mammogram for malignant neoplasm of breast: Secondary | ICD-10-CM

## 2020-02-26 ENCOUNTER — Ambulatory Visit: Payer: Medicare Other | Admitting: *Deleted

## 2020-02-26 DIAGNOSIS — I10 Essential (primary) hypertension: Secondary | ICD-10-CM

## 2020-02-26 DIAGNOSIS — M159 Polyosteoarthritis, unspecified: Secondary | ICD-10-CM

## 2020-02-26 NOTE — Chronic Care Management (AMB) (Signed)
  Chronic Care Management   Initial Visit Note  02/26/2020 Name: Diane Grant MRN: [id number] DOB: October 15, 1950  Referred by: [name], DO Reason for referral : Chronic Care Management (RN follow up)   Flonnie Wierman is a 70 y.o. year old female who is a primary care patient of [name], DO. The CCM team was consulted for assistance with chronic disease management and care coordination needs related to HTN and osteoarthritis  Review of patient status, including review of consultants reports, relevant laboratory and other test results was performed prior to outreach.   I spoke with [name] by telephone today regarding management of their chronic medical conditions. They do not have any CCM or resource needs and feel that their medical conditions are well managed. They appreciated the outreach, but do not feel that CCM services are needed at this time. They will reach out in the future if a need arises.   SDOH (Social Determinants of Health) assessments performed: No See Care Plan activities for detailed interventions related to SDOH     Plan:  . The patient has been provided with contact information for the care management team and has been advised to call if they would like to re-enroll in the CCM program to receive care management and care coordination services.  . CCM enrollment status changed to "previously enrolled" as per patient request on 02/26/2020  to discontinue enrollment. Case closed to case management services in primary care home.  . Patient was provided with general disease management education and encouraged to follow-up with their PCP and specialists as recommended.   [name], BSN, RN-BC Embedded Chronic Care Manager [facility] [place] [facility] Direct Dial: [phone]

## 2020-03-23 ENCOUNTER — Other Ambulatory Visit: Payer: Self-pay | Admitting: Family

## 2020-03-23 DIAGNOSIS — J301 Allergic rhinitis due to pollen: Secondary | ICD-10-CM

## 2020-03-28 ENCOUNTER — Other Ambulatory Visit: Payer: Self-pay | Admitting: Family

## 2020-03-28 ENCOUNTER — Ambulatory Visit: Payer: Medicare Other

## 2020-03-28 DIAGNOSIS — J301 Allergic rhinitis due to pollen: Secondary | ICD-10-CM

## 2020-03-29 ENCOUNTER — Ambulatory Visit (INDEPENDENT_AMBULATORY_CARE_PROVIDER_SITE_OTHER): Payer: Medicare Other | Admitting: Nurse Practitioner

## 2020-03-29 ENCOUNTER — Encounter: Payer: Self-pay | Admitting: Nurse Practitioner

## 2020-03-29 VITALS — BP 111/68 | HR 73 | Temp 97.7°F

## 2020-03-29 DIAGNOSIS — J029 Acute pharyngitis, unspecified: Secondary | ICD-10-CM

## 2020-03-29 DIAGNOSIS — R059 Cough, unspecified: Secondary | ICD-10-CM

## 2020-03-29 MED ORDER — PREDNISONE 10 MG (21) PO TBPK: ORAL_TABLET | ORAL | 0 refills | Status: DC

## 2020-03-29 MED ORDER — AZITHROMYCIN 250 MG PO TABS: ORAL_TABLET | ORAL | 0 refills | Status: DC

## 2020-03-29 MED ORDER — DM-GUAIFENESIN ER 30-600 MG PO TB12
1.0000 | ORAL_TABLET | Freq: Two times a day (BID) | ORAL | 0 refills | Status: DC
Start: 2020-03-29 — End: 2020-06-17

## 2020-03-29 NOTE — Patient Instructions (Signed)

## 2020-03-29 NOTE — Progress Notes (Signed)
Acute Office Visit  Subjective:    Patient ID: Diane Grant, female    DOB: 1950-10-02, 70 y.o.   MRN: [id number]  Chief Complaint  Patient presents with  . Cough  . Shortness of Breath  . Chest Pain  . Nasal Congestion  . Urticaria    Cough This is a new problem. The current episode started in the past 7 days. The problem has been gradually worsening. The problem occurs constantly. The cough is non-productive. Associated symptoms include chest pain, nasal congestion and shortness of breath. Pertinent negatives include no fever. Nothing aggravates the symptoms. She has tried cool air for the symptoms. There is no history of asthma or COPD.     Past Medical History:  Diagnosis Date  . Degenerative disc disease, lumbar   . Hypertension   . Thyroid disease    hypothyroidism    Past Surgical History:  Procedure Laterality Date  . ABDOMINAL HYSTERECTOMY    . ROTATOR CUFF REPAIR  11/30/2015   Right shoulder    Family History  Problem Relation Age of Onset  . Arthritis Mother   . Heart attack Father        78s MI  . Heart disease Father   . Asthma Sister   . Heart disease Brother   . Asthma Maternal Grandmother   . Heart attack Maternal Grandmother   . Breast cancer Maternal Grandmother   . Cancer Brother        liver  . Healthy Daughter   . Healthy Daughter   . Healthy Daughter   . Healthy Daughter   . Healthy Daughter   . Thyroid cancer Neg Hx   . Colon cancer Neg Hx   . Prostate cancer Neg Hx   . Ovarian cancer Neg Hx     Social History   Socioeconomic History  . Marital status: Married    Spouse name: Jeneen Rinks  . Number of children: 5  . Years of education: 62  . Highest education level: High school graduate  Occupational History  . Not on file  Tobacco Use  . Smoking status: Never Smoker  . Smokeless tobacco: Never Used  Vaping Use  . Vaping Use: Never used  Substance and Sexual Activity  . Alcohol use: No  . Drug use: No  .  Sexual activity: Not Currently  Other Topics Concern  . Not on file  Social History Narrative   Lives with husband.     Social Determinants of Health   Financial Resource Strain: Low Risk   . Difficulty of Paying Living Expenses: Not hard at all  Food Insecurity: No Food Insecurity  . Worried About Charity fundraiser in the Last Year: Never true  . Ran Out of Food in the Last Year: Never true  Transportation Needs: No Transportation Needs  . Lack of Transportation (Medical): No  . Lack of Transportation (Non-Medical): No  Physical Activity: Inactive  . Days of Exercise per Week: 0 days  . Minutes of Exercise per Session: 0 min  Stress: No Stress Concern Present  . Feeling of Stress : Not at all  Social Connections: Socially Integrated  . Frequency of Communication with Friends and Family: More than three times a week  . Frequency of Social Gatherings with Friends and Family: Once a week  . Attends Religious Services: More than 4 times per year  . Active Member of Clubs or Organizations: Yes  . Attends Archivist Meetings: More than 4 times  per year  . Marital Status: Married  Human resources officer Violence: Not At Risk  . Fear of Current or Ex-Partner: No  . Emotionally Abused: No  . Physically Abused: No  . Sexually Abused: No    Outpatient Medications Prior to Visit  Medication Sig Dispense Refill  . dicyclomine (BENTYL) 20 MG tablet Take 20 mg by mouth 3 (three) times daily.    Arna Medici 75 MCG tablet TAKE 1 TABLET BY MOUTH ONCE DAILY BEFORE BREAKFAST 90 tablet 2  . hydrOXYzine (ATARAX/VISTARIL) 10 MG tablet Take 1 tablet (10 mg total) by mouth 3 (three) times daily as needed for itching. 40 tablet 0  . levocetirizine (XYZAL) 5 MG tablet Take by mouth.    . losartan-hydrochlorothiazide (HYZAAR) 100-25 MG tablet Take 1 tablet by mouth daily. 90 tablet 3  . meclizine (ANTIVERT) 25 MG tablet Take 1 tablet (25 mg total) by mouth 3 (three) times daily as needed for  dizziness. 30 tablet 0  . meloxicam (MOBIC) 15 MG tablet Take 1 tablet (15 mg total) by mouth daily. (ONLY ONCE DAILY if needed for joint/ back pain) 30 tablet 3  . methocarbamol (ROBAXIN) 500 MG tablet Take 1 tablet (500 mg total) by mouth 4 (four) times daily as needed for muscle spasms. 40 tablet 0  . montelukast (SINGULAIR) 10 MG tablet Take 1 tablet (10 mg total) by mouth at bedtime. (for allergy/ asthma) 30 tablet 3  . potassium chloride SA (KLOR-CON) 20 MEQ tablet Take 1 tablet (20 mEq total) by mouth daily. 30 tablet 3  . PROAIR HFA 108 (90 Base) MCG/ACT inhaler INHALE 2 PUFFS BY MOUTH EVERY 6 HOURS AS NEEDED FOR WHEEZING OR SHORTNESS OF BREATH 9 g 0  . Wheat Dextrin (BENEFIBER PO) Take by mouth.    . levocetirizine (XYZAL) 5 MG tablet TAKE 1 TABLET BY MOUTH ONCE DAILY IN THE EVENING 90 tablet 1  . fluticasone (FLONASE) 50 MCG/ACT nasal spray Place 2 sprays into both nostrils daily. (Patient not taking: Reported on 03/29/2020) 16 g 6  . azithromycin (ZITHROMAX Z-PAK) 250 MG tablet Take two right away Then one a day for the next 4 days. 6 each 0  . omeprazole (PRILOSEC) 40 MG capsule Take 40 mg by mouth daily.    . predniSONE (DELTASONE) 10 MG tablet Take 5 daily for 3 days followed by 4,3,2 and 1 for 3 days each. 45 tablet 0   No facility-administered medications prior to visit.    Allergies  Allergen Reactions  . Anesthesia S-I-40 [Propofol]     Per patient need to be careful when giving this    Review of Systems  Constitutional: Negative for fever.  HENT: Negative.   Eyes: Negative.   Respiratory: Positive for cough and shortness of breath.   Cardiovascular: Positive for chest pain.  Gastrointestinal: Negative for nausea and vomiting.  Genitourinary: Negative.   Musculoskeletal: Negative.   Skin: Negative.   Neurological: Negative.   All other systems reviewed and are negative.      Objective:    Physical Exam Vitals reviewed.  Constitutional:      Appearance: She  is well-developed.  HENT:     Head: Normocephalic.     Nose: Congestion present.  Cardiovascular:     Rate and Rhythm: Normal rate and regular rhythm.     Pulses: Normal pulses.     Heart sounds: Normal heart sounds.  Pulmonary:     Comments: Cough Abdominal:     General: Bowel sounds are normal.  Musculoskeletal:        General: Normal range of motion.  Skin:    General: Skin is warm.  Neurological:     Mental Status: She is alert and oriented to person, place, and time.     BP 111/68   Pulse 73   Temp 97.7 F (36.5 C)   SpO2 100%  Wt Readings from Last 3 Encounters:  06/23/19 168 lb (76.2 kg)  06/03/19 157 lb 13.6 oz (71.6 kg)  09/17/18 157 lb 12.8 oz (71.6 kg)    Health Maintenance Due  Topic Date Due  . INFLUENZA VACCINE  09/06/2019  . COVID-19 Vaccine (3 - Booster for Pfizer series) 09/14/2019    There are no preventive care reminders to display for this patient.   Lab Results  Component Value Date   TSH 2.800 06/23/2019   Lab Results  Component Value Date   WBC 8.6 06/23/2019   HGB 13.5 06/23/2019   HCT 41.1 06/23/2019   MCV 90 06/23/2019   PLT 297 06/23/2019   Lab Results  Component Value Date   NA 143 06/23/2019   K 3.3 (L) 06/23/2019   CO2 26 06/23/2019   GLUCOSE 94 06/23/2019   BUN 11 06/23/2019   CREATININE 0.74 06/23/2019   BILITOT 0.6 09/09/2018   ALKPHOS 66 09/09/2018   AST 39 09/09/2018   ALT 37 09/09/2018   PROT 8.2 (H) 09/09/2018   ALBUMIN 4.6 09/09/2018   CALCIUM 9.5 06/23/2019   ANIONGAP 12 09/09/2018   [place]    Assessment & Plan:   Problem List Items Addressed This Visit      Respiratory   Acute pharyngitis    Acute pharyngitis symptoms not well controlled.  Patient is reporting cough, congestion, no shortness of breath in the last few days.  Completed COVID-19 swab results pending.  Started patient on azithromycin, prednisone pack and guaifenesin for cough and congestion,        Relevant Medications    azithromycin (ZITHROMAX) 250 MG tablet   dextromethorphan-guaiFENesin (MUCINEX DM) 30-600 MG 12hr tablet   predniSONE (STERAPRED UNI-PAK 21 TAB) 10 MG (21) TBPK tablet     Other   Cough - Primary   Relevant Medications   azithromycin (ZITHROMAX) 250 MG tablet   dextromethorphan-guaiFENesin (MUCINEX DM) 30-600 MG 12hr tablet   predniSONE (STERAPRED UNI-PAK 21 TAB) 10 MG (21) TBPK tablet   Other Relevant Orders   Novel Coronavirus, NAA ([facility])       Meds ordered this encounter  Medications  . azithromycin (ZITHROMAX) 250 MG tablet    Sig: 500 mg tablet day1, 250 mg tablet day 2-5    Dispense:  12 each    Refill:  0    Order Specific Question:   Supervising Provider    Answer:   [name] [[id number]]  . dextromethorphan-guaiFENesin (MUCINEX DM) 30-600 MG 12hr tablet    Sig: Take 1 tablet by mouth 2 (two) times daily.    Dispense:  30 tablet    Refill:  0    Order Specific Question:   Supervising Provider    Answer:   [name] [[id number]]  . predniSONE (STERAPRED UNI-PAK 21 TAB) 10 MG (21) TBPK tablet    Sig: 6 tablets day 1, 5 tablet day 2, 4 tablet day 3, 3 tablet day 4, 2 tablet day 5, 1 tablet day 6    Dispense:  1 each    Refill:  0    Order Specific Question:   Supervising  Provider    Answer:   [name] [[id number]]     Ivy Lynn, NP

## 2020-03-29 NOTE — Assessment & Plan Note (Signed)
Acute pharyngitis symptoms not well controlled.  Patient is reporting cough, congestion, no shortness of breath in the last few days.  Completed COVID-19 swab results pending.  Started patient on azithromycin, prednisone pack and guaifenesin for cough and congestion,

## 2020-03-30 LAB — SARS-COV-2, NAA 2 DAY TAT

## 2020-03-30 LAB — NOVEL CORONAVIRUS, NAA: SARS-CoV-2, NAA: NOT DETECTED

## 2020-04-23 ENCOUNTER — Other Ambulatory Visit: Payer: Self-pay | Admitting: Family Medicine

## 2020-04-23 DIAGNOSIS — R42 Dizziness and giddiness: Secondary | ICD-10-CM

## 2020-04-25 ENCOUNTER — Other Ambulatory Visit: Payer: Self-pay | Admitting: Family Medicine

## 2020-04-25 DIAGNOSIS — R42 Dizziness and giddiness: Secondary | ICD-10-CM

## 2020-04-27 ENCOUNTER — Other Ambulatory Visit: Payer: Self-pay | Admitting: *Deleted

## 2020-04-27 MED ORDER — HYDROXYZINE HCL 10 MG PO TABS
10.0000 mg | ORAL_TABLET | Freq: Three times a day (TID) | ORAL | 0 refills | Status: DC | PRN
Start: 2020-04-27 — End: 2020-08-05

## 2020-04-28 ENCOUNTER — Other Ambulatory Visit: Payer: Self-pay | Admitting: *Deleted

## 2020-04-29 MED ORDER — METHOCARBAMOL 500 MG PO TABS: 500.0000 mg | ORAL_TABLET | Freq: Four times a day (QID) | ORAL | 0 refills | Status: DC | PRN

## 2020-05-17 DIAGNOSIS — L578 Other skin changes due to chronic exposure to nonionizing radiation: Secondary | ICD-10-CM | POA: Diagnosis not present

## 2020-05-17 DIAGNOSIS — L820 Inflamed seborrheic keratosis: Secondary | ICD-10-CM | POA: Diagnosis not present

## 2020-05-17 DIAGNOSIS — L814 Other melanin hyperpigmentation: Secondary | ICD-10-CM | POA: Diagnosis not present

## 2020-05-17 DIAGNOSIS — D485 Neoplasm of uncertain behavior of skin: Secondary | ICD-10-CM | POA: Diagnosis not present

## 2020-06-03 ENCOUNTER — Ambulatory Visit (INDEPENDENT_AMBULATORY_CARE_PROVIDER_SITE_OTHER): Payer: Medicare Other

## 2020-06-03 VITALS — Ht 63.0 in | Wt 160.0 lb

## 2020-06-03 DIAGNOSIS — Z0000 Encounter for general adult medical examination without abnormal findings: Secondary | ICD-10-CM

## 2020-06-03 NOTE — Progress Notes (Signed)
Subjective:   Diane Grant is a 70 y.o. female who presents for Medicare Annual (Subsequent) preventive examination.  Virtual Visit via Telephone Note  I connected with  Diane Grant on 06/03/20 at  9:45 AM EDT by telephone and verified that I am speaking with the correct person using two identifiers.  Location: Patient: Home Provider: [facility] Persons participating in the virtual visit: patient/Nurse Health Advisor   I discussed the limitations, risks, security and privacy concerns of performing an evaluation and management service by telephone and the availability of in person appointments. The patient expressed understanding and agreed to proceed.  Interactive audio and video telecommunications were attempted between this nurse and patient, however failed, due to patient having technical difficulties OR patient did not have access to video capability.  We continued and completed visit with audio only.  Some vital signs may be absent or patient reported.   Damarco Keysor E [place]oteau, LPN   Review of Systems     Cardiac Risk Factors include: advanced age (>61men, >61 women);family history of premature cardiovascular disease;sedentary lifestyle;hypertension;dyslipidemia     Objective:    Today's Vitals   06/03/20 0949  Weight: 160 lb (72.6 kg)  Height: 5\' 3"  (1.6 m)  PainSc: 6    Body mass index is 28.34 kg/m.  Advanced Directives 06/03/2020 06/03/2019 09/09/2018 06/02/2018  Does Patient Have a Medical Advance Directive? No No No No  Would patient like information on creating a medical advance directive? Yes (MAU/Ambulatory/Procedural Areas - Information given) Yes (MAU/Ambulatory/Procedural Areas - Information given) Yes (ED - Information included in AVS) Yes (MAU/Ambulatory/Procedural Areas - Information given)    Current Medications (verified) Outpatient Encounter Medications as of 06/03/2020  Medication Sig  . dicyclomine (BENTYL) 20 MG tablet Take 20 mg by  mouth 3 (three) times daily.  . EUTHYROX 75 MCG tablet TAKE 1 TABLET BY MOUTH ONCE DAILY BEFORE BREAKFAST  . fluticasone (FLONASE) 50 MCG/ACT nasal spray Place 2 sprays into both nostrils daily.  . hydrOXYzine (ATARAX/VISTARIL) 10 MG tablet Take 1 tablet (10 mg total) by mouth 3 (three) times daily as needed for itching.  . levocetirizine (XYZAL) 5 MG tablet Take by mouth.  . losartan-hydrochlorothiazide (HYZAAR) 100-25 MG tablet Take 1 tablet by mouth daily.  . meclizine (ANTIVERT) 25 MG tablet TAKE 1 TABLET BY MOUTH THREE TIMES DAILY AS NEEDED FOR DIZZINESS  . meloxicam (MOBIC) 15 MG tablet Take 1 tablet (15 mg total) by mouth daily. (ONLY ONCE DAILY if needed for joint/ back pain)  . methocarbamol (ROBAXIN) 500 MG tablet Take 1 tablet (500 mg total) by mouth 4 (four) times daily as needed for muscle spasms.  . montelukast (SINGULAIR) 10 MG tablet Take 1 tablet (10 mg total) by mouth at bedtime. (for allergy/ asthma)  . potassium chloride SA (KLOR-CON) 20 MEQ tablet Take 1 tablet (20 mEq total) by mouth daily.  . predniSONE (STERAPRED UNI-PAK 21 TAB) 10 MG (21) TBPK tablet 6 tablets day 1, 5 tablet day 2, 4 tablet day 3, 3 tablet day 4, 2 tablet day 5, 1 tablet day 6  . PROAIR HFA 108 (90 Base) MCG/ACT inhaler INHALE 2 PUFFS BY MOUTH EVERY 6 HOURS AS NEEDED FOR WHEEZING OR SHORTNESS OF BREATH  . Wheat Dextrin (BENEFIBER PO) Take by mouth.  Marland Kitchen azithromycin (ZITHROMAX) 250 MG tablet 500 mg tablet day1, 250 mg tablet day 2-5 (Patient not taking: Reported on 06/03/2020)  . dextromethorphan-guaiFENesin (MUCINEX DM) 30-600 MG 12hr tablet Take 1 tablet by mouth 2 (two)  times daily. (Patient not taking: Reported on 06/03/2020)   No facility-administered encounter medications on file as of 06/03/2020.    Allergies (verified) Anesthesia [id number]-[id number] [propofol]   History: Past Medical History:  Diagnosis Date  . Degenerative disc disease, lumbar   . Hypertension   . Thyroid disease    hypothyroidism    Past Surgical History:  Procedure Laterality Date  . ABDOMINAL HYSTERECTOMY    . ROTATOR CUFF REPAIR  11/30/2015   Right shoulder   Family History  Problem Relation Age of Onset  . Arthritis Mother   . Heart attack Father        67s MI  . Heart disease Father   . Asthma Sister   . Heart disease Brother   . Asthma Maternal Grandmother   . Heart attack Maternal Grandmother   . Breast cancer Maternal Grandmother   . Cancer Brother        liver  . Healthy Daughter   . Healthy Daughter   . Healthy Daughter   . Healthy Daughter   . Healthy Daughter   . Thyroid cancer Neg Hx   . Colon cancer Neg Hx   . Prostate cancer Neg Hx   . Ovarian cancer Neg Hx    Social History   Socioeconomic History  . Marital status: Married    Spouse name: Jeneen Rinks  . Number of children: 5  . Years of education: 30  . Highest education level: High school graduate  Occupational History    Employer: retired  Tobacco Use  . Smoking status: Never Smoker  . Smokeless tobacco: Never Used  Vaping Use  . Vaping Use: Never used  Substance and Sexual Activity  . Alcohol use: No  . Drug use: No  . Sexual activity: Not Currently  Other Topics Concern  . Not on file  Social History Narrative   Lives with husband. Has 3 daughters and 2 step-daughters - all live within an hour away   Social Determinants of Health   Financial Resource Strain: Low Risk   . Difficulty of Paying Living Expenses: Not hard at all  Food Insecurity: No Food Insecurity  . Worried About Charity fundraiser in the Last Year: Never true  . Ran Out of Food in the Last Year: Never true  Transportation Needs: No Transportation Needs  . Lack of Transportation (Medical): No  . Lack of Transportation (Non-Medical): No  Physical Activity: Insufficiently Active  . Days of Exercise per Week: 7 days  . Minutes of Exercise per Session: 20 min  Stress: No Stress Concern Present  . Feeling of Stress : Not at all  Social Connections:  Socially Integrated  . Frequency of Communication with Friends and Family: More than three times a week  . Frequency of Social Gatherings with Friends and Family: Twice a week  . Attends Religious Services: More than 4 times per year  . Active Member of Clubs or Organizations: Yes  . Attends Archivist Meetings: More than 4 times per year  . Marital Status: Married    Tobacco Counseling Counseling given: Not Answered   Clinical Intake:  Pre-visit preparation completed: Yes  Pain : 0-10 Pain Score: 6  Pain Type: Chronic pain Pain Location: Back Pain Descriptors / Indicators: Aching Pain Onset: More than a month ago Pain Frequency: Intermittent     BMI - recorded: 28.34 Nutritional Status: BMI 25 -29 Overweight Nutritional Risks: None Diabetes: No  How often do you need to have someone help  you when you read instructions, pamphlets, or other written materials from your doctor or pharmacy?: 1 - Never  Diabetic? No  Interpreter Needed?: No  Information entered by :: Christie Copley, LPN   Activities of Daily Living In your present state of health, do you have any difficulty performing the following activities: 06/03/2020  Hearing? N  Vision? N  Difficulty concentrating or making decisions? N  Walking or climbing stairs? N  Dressing or bathing? N  Doing errands, shopping? N  Preparing Food and eating ? N  Using the Toilet? N  In the past six months, have you accidently leaked urine? N  Do you have problems with loss of bowel control? N  Managing your Medications? N  Managing your Finances? N  Housekeeping or managing your Housekeeping? N  Some recent data might be hidden    Patient Care Team: [name], DO as PCP - General (Family Medicine) Minus Breeding, MD as PCP - Cardiology (Cardiology) Gala Romney Cristopher Estimable, MD as Consulting Physician (Gastroenterology) Pleasant, Zettie Cooley, PA as Consulting Physician (Gastroenterology) Marshell Garfinkel, MD  as Consulting Physician (Pulmonary Disease)  Indicate any recent Medical Services you may have received from other than [facility] providers in the past year (date may be approximate).     Assessment:   This is a routine wellness examination for Diane Grant.  Hearing/Vision screen  Hearing Screening   125Hz  250Hz  500Hz  1000Hz  2000Hz  3000Hz  4000Hz  6000Hz  8000Hz   Right ear:           Left ear:           Comments: Denies hearing difficulties  Vision Screening Comments: Has glasses, but hasn't taken time to get used to them, so doesn't wear them - Last exam was at [facility] in [place] 02/2020 - Doesn't have routine eye exams  Dietary issues and exercise activities discussed: Current Exercise Habits: Home exercise routine, Type of exercise: walking, Time (Minutes): 20, Frequency (Times/Week): 7, Weekly Exercise (Minutes/Week): 140, Intensity: Mild, Exercise limited by: None identified  Goals    . awv goals     06/02/2018 AWV Goal: Keep All Scheduled Appointments  Over the next year, patient will attend all scheduled appointments with their PCP and any specialists that they see.  06/02/2018 AWV Goal: Exercise for General Health   Patient will verbalize understanding of the benefits of increased physical activity:  Exercising regularly is important. It will improve your overall fitness, flexibility, and endurance.  Regular exercise also will improve your overall health. It can help you control your weight, reduce stress, and improve your bone density.  Over the next year, patient will increase physical activity as tolerated with a goal of at least 150 minutes of moderate physical activity per week.   You can tell that you are exercising at a moderate intensity if your heart starts beating faster and you start breathing faster but can still hold a conversation.  Moderate-intensity exercise ideas include:  Walking 1 mile (1.6 km) in about 15  minutes  Biking  Hiking  Golfing  Dancing  Water aerobics  Patient will verbalize understanding of everyday activities that increase physical activity by providing examples like the following: ? Yard work, such as: ? Pushing a Conservation officer, nature ? Raking and bagging leaves ? Washing your car ? Pushing a stroller ? Shoveling snow ? Gardening ? Washing windows or floors  Patient will be able to explain general safety guidelines for exercising:   Before you start a new exercise program, talk with  your health care provider.  Do not exercise so much that you hurt yourself, feel dizzy, or get very short of breath.  Wear comfortable clothes and wear shoes with good support.  Drink plenty of water while you exercise to prevent dehydration or heat stroke.  Work out until your breathing and your heartbeat get faster.     . Exercise 3x per week (30 min per time)     06/03/2019 AWV Goal: Exercise for General Health   Patient will verbalize understanding of the benefits of increased physical activity:  Exercising regularly is important. It will improve your overall fitness, flexibility, and endurance.  Regular exercise also will improve your overall health. It can help you control your weight, reduce stress, and improve your bone density.  Over the next year, patient will increase physical activity as tolerated with a goal of at least 150 minutes of moderate physical activity per week.   You can tell that you are exercising at a moderate intensity if your heart starts beating faster and you start breathing faster but can still hold a conversation.  Moderate-intensity exercise ideas include:  Walking 1 mile (1.6 km) in about 15 minutes  Biking  Hiking  Golfing  Dancing  Water aerobics  Patient will verbalize understanding of everyday activities that increase physical activity by providing examples like the following: ? Yard work, such as: ? Pushing a Conservation officer, nature ? Raking  and bagging leaves ? Washing your car ? Pushing a stroller ? Shoveling snow ? Gardening ? Washing windows or floors  Patient will be able to explain general safety guidelines for exercising:   Before you start a new exercise program, talk with your health care provider.  Do not exercise so much that you hurt yourself, feel dizzy, or get very short of breath.  Wear comfortable clothes and wear shoes with good support.  Drink plenty of water while you exercise to prevent dehydration or heat stroke.  Work out until your breathing and your heartbeat get faster.     . Have 3 meals a day     06/03/2019 AWV Goal: Improved Nutrition/Diet  . Patient will verbalize understanding that diet plays an important role in overall health and that a poor diet is a risk factor for many chronic medical conditions.  . Over the next year, patient will improve self management of their diet by incorporating improved meal pattern, more consistent meal timing, better food choices, and eat 6 small meals per day. . Patient will utilize available community resources to help with food acquisition if needed (ex: food pantries, [address], etc) . Patient will work with nutrition specialist if a referral was made     . Patient Stated     She would like to lose weight and get more active Goals Addressed             This Visit's Progress   . awv goals   On track    06/02/2018 AWV Goal: Keep All Scheduled Appointments  Over the next year, patient will attend all scheduled appointments with their PCP and any specialists that they see.  06/02/2018 AWV Goal: Exercise for General Health   Patient will verbalize understanding of the benefits of increased physical activity:  Exercising regularly is important. It will improve your overall fitness, flexibility, and endurance.  Regular exercise also will improve your overall health. It can help you control your weight, reduce stress, and improve your bone  density.  Over the next year, patient  will increase physical activity as tolerated with a goal of at least 150 minutes of moderate physical activity per week.   You can tell that you are exercising at a moderate intensity if your heart starts beating faster and you start breathing faster but can still hold a conversation.  Moderate-intensity exercise ideas include:  Walking 1 mile (1.6 km) in about 15 minutes  Biking  Hiking  Golfing  Dancing  Water aerobics  Patient will verbalize understanding of everyday activities that increase physical activity by providing examples like the following: ? Yard work, such as: ? Pushing a Surveyor, mining ? Raking and bagging leaves ? Washing your car ? Pushing a stroller ? Shoveling snow ? Gardening ? Washing windows or floors  Patient will be able to explain general safety guidelines for exercising:   Before you start a new exercise program, talk with your health care provider.  Do not exercise so much that you hurt yourself, feel dizzy, or get very short of breath.  Wear comfortable clothes and wear shoes with good support.  Drink plenty of water while you exercise to prevent dehydration or heat stroke.  Work out until your breathing and your heartbeat get faster.     . Have 3 meals a day   On track    06/03/2019 AWV Goal: Improved Nutrition/Diet  . Patient will verbalize understanding that diet plays an important role in overall health and that a poor diet is a risk factor for many chronic medical conditions.  . Over the next year, patient will improve self management of their diet by incorporating improved meal pattern, more consistent meal timing, better food choices, and eat 6 small meals per day. . Patient will utilize available community resources to help with food acquisition if needed (ex: food pantries, [address], etc) . Patient will work with nutrition specialist if a referral was made     . Patient Stated       She would  like to lose weight and get more active             Depression Screen PHQ 2/9 Scores 06/03/2020 03/29/2020 06/23/2019 06/03/2019 06/02/2018 04/07/2018 03/17/2018  PHQ - 2 Score 0 0 0 0 0 0 0  PHQ- 9 Score - - 0 - - - -    Fall Risk Fall Risk  06/03/2020 03/29/2020 06/23/2019 06/03/2019 06/02/2018  Falls in the past year? 0 0 0 0 0  Number falls in past yr: 0 - - 0 -  Injury with Fall? 0 - - 0 -  Risk for fall due to : No Fall Risks - - No Fall Risks -  Follow up Falls prevention discussed - - Falls evaluation completed -    FALL RISK PREVENTION PERTAINING TO THE HOME:  Any stairs in or around the home? Yes  If so, are there any without handrails? No  Home free of loose throw rugs in walkways, pet beds, electrical cords, etc? Yes  Adequate lighting in your home to reduce risk of falls? Yes   ASSISTIVE DEVICES UTILIZED TO PREVENT FALLS:  Life alert? No  Use of a cane, walker or w/c? No  Grab bars in the bathroom? Yes  Shower chair or bench in shower? Yes  Elevated toilet seat or a handicapped toilet? No   TIMED UP AND GO:  Was the test performed? No . Telephonic visit.  Cognitive Function:Normal cognitive status assessed by direct observation by this Nurse Health Advisor. No abnormalities found.  6CIT Screen 06/03/2019 06/02/2018  What Year? 0 points 0 points  What month? 0 points 0 points  What time? 0 points 0 points  Count back from 20 0 points 0 points  Months in reverse 0 points 4 points  Repeat phrase 0 points 0 points  Total Score 0 4    Immunizations Immunization History  Administered Date(s) Administered  . Influenza, High Dose Seasonal PF 11/15/2015, 02/24/2017, 12/04/2017  . Influenza,inj,Quad PF,6+ Mos 02/19/2017  . PFIZER(Purple Top)SARS-COV-2 Vaccination 02/24/2019, 03/17/2019, 11/29/2019  . Pneumococcal Conjugate-13 06/23/2019  . Pneumococcal Polysaccharide-23 03/28/2016  . Tdap 06/15/2015    TDAP status: Up to date  Flu Vaccine status: Due,  Education has been provided regarding the importance of this vaccine. Advised may receive this vaccine at local pharmacy or Health Dept. Aware to provide a copy of the vaccination record if obtained from local pharmacy or Health Dept. Verbalized acceptance and understanding.  Pneumococcal vaccine status: Up to date  Covid-19 vaccine status: Completed vaccines  Qualifies for Shingles Vaccine? Yes   Zostavax completed No   Shingrix Completed?: No.    Education has been provided regarding the importance of this vaccine. Patient has been advised to call insurance company to determine out of pocket expense if they have not yet received this vaccine. Advised may also receive vaccine at local pharmacy or Health Dept. Verbalized acceptance and understanding.  Screening Tests Health Maintenance  Topic Date Due  . INFLUENZA VACCINE  09/05/2020  . MAMMOGRAM  02/02/2021  . DEXA SCAN  04/04/2021  . COLONOSCOPY (Pts 45-69yrs Insurance coverage will need to be confirmed)  11/26/2023  . TETANUS/TDAP  06/14/2025  . COVID-19 Vaccine  Completed  . Hepatitis C Screening  Completed  . PNA vac Low Risk Adult  Completed  . HPV VACCINES  Aged Out    Health Maintenance  There are no preventive care reminders to display for this patient.  Colorectal cancer screening: Type of screening: Colonoscopy. Completed 11/26/2018. Repeat every 5 years  Mammogram status: Completed 02/03/2020. Repeat every year  Bone Density status: Completed 04/04/2016. Results reflect: Bone density results: NORMAL. Repeat every 5 years.  Lung Cancer Screening: (Low Dose CT Chest recommended if Age 21-80 years, 30 pack-year currently smoking OR have quit w/in 15years.) does not qualify.   Additional Screening:  Hepatitis C Screening: does qualify; Completed 06/22/2016  Vision Screening: Recommended annual ophthalmology exams for early detection of glaucoma and other disorders of the eye. Is the patient up to date with their annual  eye exam?  Yes  Who is the provider or what is the name of the office in which the patient attends annual eye exams? No routine eye doctor - last went to [facility] in [place]. If pt is not established with a provider, would they like to be referred to a provider to establish care? No .   Dental Screening: Recommended annual dental exams for proper oral hygiene  Community Resource Referral / Chronic Care Management: CRR required this visit?  No   CCM required this visit?  No      Plan:     I have personally reviewed and noted the following in the patient's chart:   . Medical and social history . Use of alcohol, tobacco or illicit drugs  . Current medications and supplements . Functional ability and status . Nutritional status . Physical activity . Advanced directives . List of other physicians . Hospitalizations, surgeries, and ER visits in previous 12 months . Vitals .  Screenings to include cognitive, depression, and falls . Referrals and appointments  In addition, I have reviewed and discussed with patient certain preventive protocols, quality metrics, and best practice recommendations. A written personalized care plan for preventive services as well as general preventive health recommendations were provided to patient.     Sandrea Hammond, LPN   10/28/3005   Nurse Notes: None

## 2020-06-03 NOTE — Patient Instructions (Signed)
Diane Grant , Thank you for taking time to come for your Medicare Wellness Visit. I appreciate your ongoing commitment to your health goals. Please review the following plan we discussed and let me know if I can assist you in the future.   Screening recommendations/referrals: Colonoscopy: Done 11/26/2018 - repeat 5 years Mammogram: Done 02/03/2020 - repeat annually Bone Density: Done 04/04/2016 - repeat 5 years Recommended yearly ophthalmology/optometry visit for glaucoma screening and checkup Recommended yearly dental visit for hygiene and checkup  Vaccinations: Influenza vaccine: Due in Fall 2022 Pneumococcal vaccine: Done 03/28/2016 & 06/23/2019 Tdap vaccine: Done 06/15/2015 - repeat 10 years Shingles vaccine: Shingrix discussed. Please contact your pharmacy for coverage information.    Covid-19: Done 02/24/19, 03/17/19, & 11/29/2019  Advanced directives: Please bring a copy of your health care power of attorney and living will to the office to be added to your chart at your convenience.  Conditions/risks identified: Aim for 30 minutes of exercise or brisk walking each day, drink 6-8 glasses of water and eat lots of fruits and vegetables.  Next appointment: Follow up in one year for your annual wellness visit    Preventive Care 65 Years and Older, Female Preventive care refers to lifestyle choices and visits with your health care provider that can promote health and wellness. What does preventive care include?  A yearly physical exam. This is also called an annual well check.  Dental exams once or twice a year.  Routine eye exams. Ask your health care provider how often you should have your eyes checked.  Personal lifestyle choices, including:  Daily care of your teeth and gums.  Regular physical activity.  Eating a healthy diet.  Avoiding tobacco and drug use.  Limiting alcohol use.  Practicing safe sex.  Taking low-dose aspirin every day.  Taking vitamin and mineral  supplements as recommended by your health care provider. What happens during an annual well check? The services and screenings done by your health care provider during your annual well check will depend on your age, overall health, lifestyle risk factors, and family history of disease. Counseling  Your health care provider may ask you questions about your:  Alcohol use.  Tobacco use.  Drug use.  Emotional well-being.  Home and relationship well-being.  Sexual activity.  Eating habits.  History of falls.  Memory and ability to understand (cognition).  Work and work Statistician.  Reproductive health. Screening  You may have the following tests or measurements:  Height, weight, and BMI.  Blood pressure.  Lipid and cholesterol levels. These may be checked every 5 years, or more frequently if you are over 87 years old.  Skin check.  Lung cancer screening. You may have this screening every year starting at age 46 if you have a 30-pack-year history of smoking and currently smoke or have quit within the past 15 years.  Fecal occult blood test (FOBT) of the stool. You may have this test every year starting at age 57.  Flexible sigmoidoscopy or colonoscopy. You may have a sigmoidoscopy every 5 years or a colonoscopy every 10 years starting at age 51.  Hepatitis C blood test.  Hepatitis B blood test.  Sexually transmitted disease (STD) testing.  Diabetes screening. This is done by checking your blood sugar (glucose) after you have not eaten for a while (fasting). You may have this done every 1-3 years.  Bone density scan. This is done to screen for osteoporosis. You may have this done starting at age 68.  Mammogram. This may be done every 1-2 years. Talk to your health care provider about how often you should have regular mammograms. Talk with your health care provider about your test results, treatment options, and if necessary, the need for more tests. Vaccines  Your  health care provider may recommend certain vaccines, such as:  Influenza vaccine. This is recommended every year.  Tetanus, diphtheria, and acellular pertussis (Tdap, Td) vaccine. You may need a Td booster every 10 years.  Zoster vaccine. You may need this after age 51.  Pneumococcal 13-valent conjugate (PCV13) vaccine. One dose is recommended after age 88.  Pneumococcal polysaccharide (PPSV23) vaccine. One dose is recommended after age 26. Talk to your health care provider about which screenings and vaccines you need and how often you need them. This information is not intended to replace advice given to you by your health care provider. Make sure you discuss any questions you have with your health care provider. Document Released: 02/18/2015 Document Revised: 10/12/2015 Document Reviewed: 11/23/2014 Elsevier Interactive Patient Education  2017 [place] Prevention in the Home Falls can cause injuries. They can happen to people of all ages. There are many things you can do to make your home safe and to help prevent falls. What can I do on the outside of my home?  Regularly fix the edges of walkways and driveways and fix any cracks.  Remove anything that might make you trip as you walk through a door, such as a raised step or threshold.  Trim any bushes or trees on the path to your home.  Use bright outdoor lighting.  Clear any walking paths of anything that might make someone trip, such as rocks or tools.  Regularly check to see if handrails are loose or broken. Make sure that both sides of any steps have handrails.  Any raised decks and porches should have guardrails on the edges.  Have any leaves, snow, or ice cleared regularly.  Use sand or salt on walking paths during winter.  Clean up any spills in your garage right away. This includes oil or grease spills. What can I do in the bathroom?  Use night lights.  Install grab bars by the toilet and in the tub and  shower. Do not use towel bars as grab bars.  Use non-skid mats or decals in the tub or shower.  If you need to sit down in the shower, use a plastic, non-slip stool.  Keep the floor dry. Clean up any water that spills on the floor as soon as it happens.  Remove soap buildup in the tub or shower regularly.  Attach bath mats securely with double-sided non-slip rug tape.  Do not have throw rugs and other things on the floor that can make you trip. What can I do in the bedroom?  Use night lights.  Make sure that you have a light by your bed that is easy to reach.  Do not use any sheets or blankets that are too big for your bed. They should not hang down onto the floor.  Have a firm chair that has side arms. You can use this for support while you get dressed.  Do not have throw rugs and other things on the floor that can make you trip. What can I do in the kitchen?  Clean up any spills right away.  Avoid walking on wet floors.  Keep items that you use a lot in easy-to-reach places.  If you need to reach  something above you, use a strong step stool that has a grab bar.  Keep electrical cords out of the way.  Do not use floor polish or wax that makes floors slippery. If you must use wax, use non-skid floor wax.  Do not have throw rugs and other things on the floor that can make you trip. What can I do with my stairs?  Do not leave any items on the stairs.  Make sure that there are handrails on both sides of the stairs and use them. Fix handrails that are broken or loose. Make sure that handrails are as long as the stairways.  Check any carpeting to make sure that it is firmly attached to the stairs. Fix any carpet that is loose or worn.  Avoid having throw rugs at the top or bottom of the stairs. If you do have throw rugs, attach them to the floor with carpet tape.  Make sure that you have a light switch at the top of the stairs and the bottom of the stairs. If you do not  have them, ask someone to add them for you. What else can I do to help prevent falls?  Wear shoes that:  Do not have high heels.  Have rubber bottoms.  Are comfortable and fit you well.  Are closed at the toe. Do not wear sandals.  If you use a stepladder:  Make sure that it is fully opened. Do not climb a closed stepladder.  Make sure that both sides of the stepladder are locked into place.  Ask someone to hold it for you, if possible.  Clearly mark and make sure that you can see:  Any grab bars or handrails.  First and last steps.  Where the edge of each step is.  Use tools that help you move around (mobility aids) if they are needed. These include:  Canes.  Walkers.  Scooters.  Crutches.  Turn on the lights when you go into a dark area. Replace any light bulbs as soon as they burn out.  Set up your furniture so you have a clear path. Avoid moving your furniture around.  If any of your floors are uneven, fix them.  If there are any pets around you, be aware of where they are.  Review your medicines with your doctor. Some medicines can make you feel dizzy. This can increase your chance of falling. Ask your doctor what other things that you can do to help prevent falls. This information is not intended to replace advice given to you by your health care provider. Make sure you discuss any questions you have with your health care provider. Document Released: 11/18/2008 Document Revised: 06/30/2015 Document Reviewed: 02/26/2014 Elsevier Interactive Patient Education  2017 [employer].

## 2020-06-17 ENCOUNTER — Ambulatory Visit (INDEPENDENT_AMBULATORY_CARE_PROVIDER_SITE_OTHER): Payer: Medicare Other | Admitting: Family Medicine

## 2020-06-17 ENCOUNTER — Other Ambulatory Visit: Payer: Self-pay

## 2020-06-17 ENCOUNTER — Telehealth: Payer: Self-pay | Admitting: Cardiology

## 2020-06-17 ENCOUNTER — Other Ambulatory Visit: Payer: Self-pay | Admitting: Family Medicine

## 2020-06-17 ENCOUNTER — Ambulatory Visit (INDEPENDENT_AMBULATORY_CARE_PROVIDER_SITE_OTHER): Payer: Medicare Other

## 2020-06-17 ENCOUNTER — Encounter: Payer: Self-pay | Admitting: Family Medicine

## 2020-06-17 VITALS — BP 139/78 | HR 73 | Temp 98.1°F | Ht 63.0 in | Wt 157.2 lb

## 2020-06-17 DIAGNOSIS — E876 Hypokalemia: Secondary | ICD-10-CM | POA: Diagnosis not present

## 2020-06-17 DIAGNOSIS — Z780 Asymptomatic menopausal state: Secondary | ICD-10-CM

## 2020-06-17 DIAGNOSIS — I10 Essential (primary) hypertension: Secondary | ICD-10-CM

## 2020-06-17 DIAGNOSIS — E039 Hypothyroidism, unspecified: Secondary | ICD-10-CM

## 2020-06-17 DIAGNOSIS — J4530 Mild persistent asthma, uncomplicated: Secondary | ICD-10-CM | POA: Diagnosis not present

## 2020-06-17 DIAGNOSIS — R079 Chest pain, unspecified: Secondary | ICD-10-CM | POA: Diagnosis not present

## 2020-06-17 DIAGNOSIS — R0789 Other chest pain: Secondary | ICD-10-CM

## 2020-06-17 DIAGNOSIS — R0602 Shortness of breath: Secondary | ICD-10-CM | POA: Diagnosis not present

## 2020-06-17 DIAGNOSIS — R21 Rash and other nonspecific skin eruption: Secondary | ICD-10-CM | POA: Diagnosis not present

## 2020-06-17 MED ORDER — BUDESONIDE-FORMOTEROL FUMARATE 80-4.5 MCG/ACT IN AERO: 2.0000 | INHALATION_SPRAY | Freq: Two times a day (BID) | RESPIRATORY_TRACT | 3 refills | Status: DC

## 2020-06-17 NOTE — Progress Notes (Signed)
Subjective: CC: Atypical chest pain PCP: [name], DO [id number]:[id number] Diane Grant is a 70 y.o. female presenting to clinic today for:  1. Atypical chest pain/ SOB Patient reports that she has been having some left-sided pain that extends into the axilla and sometimes into the front of her chest.  It has crossed over to the right side of her chest as well.  She reports some wheezing and shortness of breath but notes that this is related to her husband smoking around her and the pollen.  No hemoptysis, fevers, night sweats.  No nausea, vomiting or sweating.  Pain is intermittent, sharp and not associated with activity necessarily.  ROS: Per HPI  Allergies  Allergen Reactions  . Anesthesia S-I-40 [Propofol]     Per patient need to be careful when giving this   Past Medical History:  Diagnosis Date  . Degenerative disc disease, lumbar   . Hypertension   . Thyroid disease    hypothyroidism    Current Outpatient Medications:  .  azithromycin (ZITHROMAX) 250 MG tablet, 500 mg tablet day1, 250 mg tablet day 2-5 (Patient not taking: Reported on 06/03/2020), Disp: 12 each, Rfl: 0 .  dextromethorphan-guaiFENesin (MUCINEX DM) 30-600 MG 12hr tablet, Take 1 tablet by mouth 2 (two) times daily. (Patient not taking: Reported on 06/03/2020), Disp: 30 tablet, Rfl: 0 .  dicyclomine (BENTYL) 20 MG tablet, Take 20 mg by mouth 3 (three) times daily., Disp: , Rfl:  .  EUTHYROX 75 MCG tablet, TAKE 1 TABLET BY MOUTH ONCE DAILY BEFORE BREAKFAST, Disp: 90 tablet, Rfl: 2 .  fluticasone (FLONASE) 50 MCG/ACT nasal spray, Place 2 sprays into both nostrils daily., Disp: 16 g, Rfl: 6 .  hydrOXYzine (ATARAX/VISTARIL) 10 MG tablet, Take 1 tablet (10 mg total) by mouth 3 (three) times daily as needed for itching., Disp: 40 tablet, Rfl: 0 .  levocetirizine (XYZAL) 5 MG tablet, Take by mouth., Disp: , Rfl:  .  losartan-hydrochlorothiazide (HYZAAR) 100-25 MG tablet, Take 1 tablet by mouth daily., Disp: 90  tablet, Rfl: 3 .  meclizine (ANTIVERT) 25 MG tablet, TAKE 1 TABLET BY MOUTH THREE TIMES DAILY AS NEEDED FOR DIZZINESS, Disp: 30 tablet, Rfl: 0 .  meloxicam (MOBIC) 15 MG tablet, Take 1 tablet (15 mg total) by mouth daily. (ONLY ONCE DAILY if needed for joint/ back pain), Disp: 30 tablet, Rfl: 3 .  methocarbamol (ROBAXIN) 500 MG tablet, Take 1 tablet (500 mg total) by mouth 4 (four) times daily as needed for muscle spasms., Disp: 40 tablet, Rfl: 0 .  montelukast (SINGULAIR) 10 MG tablet, Take 1 tablet (10 mg total) by mouth at bedtime. (for allergy/ asthma), Disp: 30 tablet, Rfl: 3 .  potassium chloride SA (KLOR-CON) 20 MEQ tablet, Take 1 tablet (20 mEq total) by mouth daily., Disp: 30 tablet, Rfl: 3 .  predniSONE (STERAPRED UNI-PAK 21 TAB) 10 MG (21) TBPK tablet, 6 tablets day 1, 5 tablet day 2, 4 tablet day 3, 3 tablet day 4, 2 tablet day 5, 1 tablet day 6, Disp: 1 each, Rfl: 0 .  PROAIR HFA 108 (90 Base) MCG/ACT inhaler, INHALE 2 PUFFS BY MOUTH EVERY 6 HOURS AS NEEDED FOR WHEEZING OR SHORTNESS OF BREATH, Disp: 9 g, Rfl: 0 .  Wheat Dextrin (BENEFIBER PO), Take by mouth., Disp: , Rfl:  Social History   Socioeconomic History  . Marital status: Married    Spouse name: Jeneen Rinks  . Number of children: 5  . Years of education: 34  . Highest education  level: High school graduate  Occupational History    Employer: retired  Tobacco Use  . Smoking status: Never Smoker  . Smokeless tobacco: Never Used  Vaping Use  . Vaping Use: Never used  Substance and Sexual Activity  . Alcohol use: No  . Drug use: No  . Sexual activity: Not Currently  Other Topics Concern  . Not on file  Social History Narrative   Lives with husband. Has 3 daughters and 2 step-daughters - all live within an hour away   Social Determinants of Health   Financial Resource Strain: Low Risk   . Difficulty of Paying Living Expenses: Not hard at all  Food Insecurity: No Food Insecurity  . Worried About Charity fundraiser in the  Last Year: Never true  . Ran Out of Food in the Last Year: Never true  Transportation Needs: No Transportation Needs  . Lack of Transportation (Medical): No  . Lack of Transportation (Non-Medical): No  Physical Activity: Insufficiently Active  . Days of Exercise per Week: 7 days  . Minutes of Exercise per Session: 20 min  Stress: No Stress Concern Present  . Feeling of Stress : Not at all  Social Connections: Socially Integrated  . Frequency of Communication with Friends and Family: More than three times a week  . Frequency of Social Gatherings with Friends and Family: Twice a week  . Attends Religious Services: More than 4 times per year  . Active Member of Clubs or Organizations: Yes  . Attends Archivist Meetings: More than 4 times per year  . Marital Status: Married  Human resources officer Violence: Not At Risk  . Fear of Current or Ex-Partner: No  . Emotionally Abused: No  . Physically Abused: No  . Sexually Abused: No   Family History  Problem Relation Age of Onset  . Arthritis Mother   . Heart attack Father        74s MI  . Heart disease Father   . Asthma Sister   . Heart disease Brother   . Asthma Maternal Grandmother   . Heart attack Maternal Grandmother   . Breast cancer Maternal Grandmother   . Cancer Brother        liver  . Healthy Daughter   . Healthy Daughter   . Healthy Daughter   . Healthy Daughter   . Healthy Daughter   . Thyroid cancer Neg Hx   . Colon cancer Neg Hx   . Prostate cancer Neg Hx   . Ovarian cancer Neg Hx     Objective: Office vital signs reviewed. BP 139/78   Pulse 73   Temp 98.1 F (36.7 C)   Ht 5' 3" (1.6 m)   Wt 157 lb 3.2 oz (71.3 kg)   SpO2 98%   BMI 27.85 kg/m   Physical Examination:  General: Awake, alert, well nourished, No acute distress HEENT: Normal; sclera white.  Moist mucous membranes Cardio: regular rate and rhythm, S1S2 heard, no murmurs appreciated Pulm: clear to auscultation bilaterally, no wheezes,  rhonchi or rales; normal work of breathing on room air; appears to get dyspneic with laying down Extremities: warm, well perfused, No edema, cyanosis or clubbing; +2 pulses bilaterally  Assessment/ Plan: 70 y.o. female   Atypical chest pain - Plan: EKG 12-Lead, DG Chest 2 View, Ambulatory referral to Cardiology  Mild persistent asthma, unspecified whether complicated - Plan: budesonide-formoterol (SYMBICORT) 80-4.5 MCG/ACT inhaler  Essential hypertension - Plan: Lipid Panel, CMP14+EGFR  Hypothyroidism, unspecified type -  Plan: TSH, T4, Free, Lipid Panel  Hypokalemia - Plan: Magnesium  Chest pain somewhat atypical but given her risks, I do recommend her seeing her cardiologist again and new referral has been placed.  EKG was obtained and was unremarkable  Chest x-ray ordered to further evaluate.  I suspect that the shortness of breath is likely reactive airway disease in the setting of pollen and tobacco exposure.  She has a pulmonologist and she continue following up with him as well.  Symbicort sent to pharmacy.  Blood pressure controlled.  Continue current regimen  Thyroid disorder was not discussed during the visit as much of our focus was placed on her atypical chest pain and shortness of breath.  However, levels were drawn with her other labs   No orders of the defined types were placed in this encounter.  No orders of the defined types were placed in this encounter.    [name], DO [place] [phone]

## 2020-06-17 NOTE — Telephone Encounter (Signed)
PT's daughter is calling in to get her mom scheduled for a echo.Please advis

## 2020-06-17 NOTE — Telephone Encounter (Signed)
This RN returned patient call. Upon looking for echocardiogram order, this RN saw that the patient had not been seen by this office since 06/23/2018. Patient saw Family Medicine doctor today for atypical chest pain, patient denies active chest pain at this time. The patient's PCP told her she needs to have an echocardiogram, and the patient was calling to schedule one. This RN explained that Dr. Percival Spanish may want to see the patient before ordering testing, but that her question would be forwarded to him for review.

## 2020-06-17 NOTE — Telephone Encounter (Signed)
This RN called patient back to ask if patient was interested in seeing Dr. Percival Spanish Monday 5/16 at 7:40am to follow up regarding her chest pain/PCP visit. Pt agreeable, appointment scheduled. This RN told patient if she experiences active chest pain, shortness of breath, or other troubling symptoms she should call 911 or seek care at the nearest emergency department.

## 2020-06-18 LAB — CMP14+EGFR
ALT: 16 IU/L (ref 0–32)
AST: 23 IU/L (ref 0–40)
Albumin/Globulin Ratio: 1.8 (ref 1.2–2.2)
Albumin: 4.4 g/dL (ref 3.8–4.8)
Alkaline Phosphatase: 79 IU/L (ref 44–121)
BUN/Creatinine Ratio: 21 (ref 12–28)
BUN: 15 mg/dL (ref 8–27)
Bilirubin Total: 0.6 mg/dL (ref 0.0–1.2)
CO2: 27 mmol/L (ref 20–29)
Calcium: 9.8 mg/dL (ref 8.7–10.3)
Chloride: 99 mmol/L (ref 96–106)
Creatinine, Ser: 0.72 mg/dL (ref 0.57–1.00)
Globulin, Total: 2.5 g/dL (ref 1.5–4.5)
Glucose: 90 mg/dL (ref 65–99)
Potassium: 3.8 mmol/L (ref 3.5–5.2)
Sodium: 136 mmol/L (ref 134–144)
Total Protein: 6.9 g/dL (ref 6.0–8.5)
eGFR: 90 mL/min/{1.73_m2} (ref >59)

## 2020-06-18 LAB — LIPID PANEL
Chol/HDL Ratio: 4.7 ratio — ABNORMAL HIGH (ref 0.0–4.4)
Cholesterol, Total: 178 mg/dL (ref 100–199)
HDL: 38 mg/dL — ABNORMAL LOW (ref >39)
LDL Chol Calc (NIH): 122 mg/dL — ABNORMAL HIGH (ref 0–99)
Triglycerides: 100 mg/dL (ref 0–149)
VLDL Cholesterol Cal: 18 mg/dL (ref 5–40)

## 2020-06-18 LAB — MAGNESIUM: Magnesium: 2 mg/dL (ref 1.6–2.3)

## 2020-06-18 LAB — T4, FREE: Free T4: 1.85 ng/dL — ABNORMAL HIGH (ref 0.82–1.77)

## 2020-06-18 LAB — TSH: TSH: 1.2 u[IU]/mL (ref 0.450–4.500)

## 2020-06-18 NOTE — Progress Notes (Signed)
Cardiology Office Note   Date:  06/20/2020   ID:  Diane Grant, DOB [birth year], MRN [id number]  PCP:  [name], DO  Cardiologist:   Minus Breeding, MD Referring:  [name], DO  Chief Complaint  Patient presents with  . Chest Pain      History of Present Illness: Diane Grant is a 70 y.o. female who we saw in 2020 for evaluation of dyspnea.  She was to have a nuclear test and echo but she ultimately declined to have these done.  She returns for follow up with continued chest pain.  She called recently with chest pain.  She gets this chest pain daily.  It is sharp.  It is fleeting.  And lasts for seconds at a time.  She cannot bring it on.  There does not seem to be association with nausea or vomiting.  She does not get particularly short of breath although she does have some shortness of breath when she is working in the yard and pollen.  She does not feel presyncope or syncope although there is some wooziness.  She will occasionally have some "fluttering."  Aside from the aching she has some chronic chest soreness.  All of this comes and goes spontaneously.  She cannot bring it on with activity.  Of note this is all mild in intensity.   Past Medical History:  Diagnosis Date  . Degenerative disc disease, lumbar   . Hypertension   . Thyroid disease    hypothyroidism    Past Surgical History:  Procedure Laterality Date  . ABDOMINAL HYSTERECTOMY    . ROTATOR CUFF REPAIR  11/30/2015   Right shoulder     Current Outpatient Medications  Medication Sig Dispense Refill  . budesonide-formoterol (SYMBICORT) 80-4.5 MCG/ACT inhaler Inhale 2 puffs into the lungs 2 (two) times daily. Rinse mouth after each use 1 each 3  . dicyclomine (BENTYL) 20 MG tablet Take 20 mg by mouth 3 (three) times daily.    Arna Medici 75 MCG tablet TAKE 1 TABLET BY MOUTH ONCE DAILY BEFORE BREAKFAST 90 tablet 0  . fluticasone (FLONASE) 50 MCG/ACT nasal spray Place  2 sprays into both nostrils daily. 16 g 6  . hydrOXYzine (ATARAX/VISTARIL) 10 MG tablet Take 1 tablet (10 mg total) by mouth 3 (three) times daily as needed for itching. 40 tablet 0  . levocetirizine (XYZAL) 5 MG tablet Take by mouth.    . losartan-hydrochlorothiazide (HYZAAR) 100-25 MG tablet Take 1 tablet by mouth daily. 90 tablet 3  . meclizine (ANTIVERT) 25 MG tablet TAKE 1 TABLET BY MOUTH THREE TIMES DAILY AS NEEDED FOR DIZZINESS 30 tablet 0  . meloxicam (MOBIC) 15 MG tablet Take 1 tablet (15 mg total) by mouth daily. (ONLY ONCE DAILY if needed for joint/ back pain) 30 tablet 3  . methocarbamol (ROBAXIN) 500 MG tablet Take 1 tablet (500 mg total) by mouth 4 (four) times daily as needed for muscle spasms. 40 tablet 0  . montelukast (SINGULAIR) 10 MG tablet Take 1 tablet (10 mg total) by mouth at bedtime. (for allergy/ asthma) 30 tablet 3  . potassium chloride SA (KLOR-CON) 20 MEQ tablet Take 1 tablet (20 mEq total) by mouth daily. 30 tablet 3  . predniSONE (STERAPRED UNI-PAK 21 TAB) 10 MG (21) TBPK tablet 6 tablets day 1, 5 tablet day 2, 4 tablet day 3, 3 tablet day 4, 2 tablet day 5, 1 tablet day 6 1 each 0  . PROAIR HFA  108 (90 Base) MCG/ACT inhaler INHALE 2 PUFFS BY MOUTH EVERY 6 HOURS AS NEEDED FOR WHEEZING OR SHORTNESS OF BREATH 9 g 0  . Wheat Dextrin (BENEFIBER PO) Take by mouth.     No current facility-administered medications for this visit.    Allergies:   Anesthesia s-i-40 [propofol]    ROS:  Please see the history of present illness.   Otherwise, review of systems are positive for none.   All other systems are reviewed and negative.    PHYSICAL EXAM: VS:  BP 128/74   Pulse 67   Ht 5\' 3"  (1.6 m)   Wt 157 lb (71.2 kg)   SpO2 98%   BMI 27.81 kg/m  , BMI Body mass index is 27.81 kg/m. GENERAL:  Well appearing HEENT:  Pupils equal round and reactive, fundi not visualized, oral mucosa unremarkable NECK:  No jugular venous distention, waveform within normal limits, carotid  upstroke brisk and symmetric, no bruits, no thyromegaly LYMPHATICS:  No cervical, inguinal adenopathy LUNGS:  Clear to auscultation bilaterally BACK:  No CVA tenderness CHEST:  Unremarkable HEART:  PMI not displaced or sustained,S1 and S2 within normal limits, no S3, no S4, no clicks, no rubs, no murmurs ABD:  Flat, positive bowel sounds normal in frequency in pitch, no bruits, no rebound, no guarding, no midline pulsatile mass, no hepatomegaly, no splenomegaly EXT:  2 plus pulses throughout, no edema, no cyanosis no clubbing SKIN:  No rashes no nodules NEURO:  Cranial nerves II through XII grossly intact, motor grossly intact throughout PSYCH:  Cognitively intact, oriented to person place and time    EKG:  EKG is ordered today. The ekg ordered today demonstrates sinus rhythm, rate 67, axis within normal limits, intervals within normal limits, no acute ST-T wave changes.   Recent Labs: 06/23/2019: Hemoglobin 13.5; Platelets 297 06/17/2020: ALT 16; BUN 15; Creatinine, Ser 0.72; Magnesium 2.0; Potassium 3.8; Sodium 136; TSH 1.200    Lipid Panel    Component Value Date/Time   CHOL 178 06/17/2020 0920   TRIG 100 06/17/2020 0920   HDL 38 (L) 06/17/2020 0920   CHOLHDL 4.7 (H) 06/17/2020 0920   LDLCALC 122 (H) 06/17/2020 0920      Wt Readings from Last 3 Encounters:  06/20/20 157 lb (71.2 kg)  06/17/20 157 lb 3.2 oz (71.3 kg)  06/03/20 160 lb (72.6 kg)      Other studies Reviewed: Additional studies/ records that were reviewed today include: Labs. Review of the above records demonstrates:  Please see elsewhere in the note.     ASSESSMENT AND PLAN:  CHEST PAIN:   Chest pain has nonanginal greater than anginal features.  She does have risk factors.  Given this I would like to bring her back for a POET (Plain Old Exercise Treadmill)  HTN: Her blood pressure is at target.  She will continue the meds as listed.  DYSLIPIDEMIA: I do note that her LDL was 122 couple of days ago  with an HDL of 38.  I would suggest dietary changes and repeating this in about 3 to 6 months.  If her LDL is not less than 100 it might be reasonable to guide her with a coronary calcium score to discuss goals of therapy.  Current medicines are reviewed at length with the patient today.  The patient does not have concerns regarding medicines.  The following changes have been made:  no change  Labs/ tests ordered today include:   Orders Placed This Encounter  Procedures  .  Exercise Tolerance Test  . EKG 12-Lead     Disposition:   FU with me as needed.      Signed, Minus Breeding, MD  06/20/2020 8:27 AM    [facility]

## 2020-06-20 ENCOUNTER — Encounter: Payer: Self-pay | Admitting: Cardiology

## 2020-06-20 ENCOUNTER — Other Ambulatory Visit: Payer: Self-pay

## 2020-06-20 ENCOUNTER — Ambulatory Visit: Payer: Medicare Other | Admitting: Cardiology

## 2020-06-20 VITALS — BP 128/74 | HR 67 | Ht 63.0 in | Wt 157.0 lb

## 2020-06-20 DIAGNOSIS — I10 Essential (primary) hypertension: Secondary | ICD-10-CM | POA: Diagnosis not present

## 2020-06-20 DIAGNOSIS — R072 Precordial pain: Secondary | ICD-10-CM

## 2020-06-20 DIAGNOSIS — E039 Hypothyroidism, unspecified: Secondary | ICD-10-CM

## 2020-06-20 NOTE — Patient Instructions (Addendum)
Medication Instructions:  Your physician recommends that you continue on your current medications as directed. Please refer to the Current Medication list given to you today.   Labwork: NONE  Testing/Procedures: Your physician has requested that you have an exercise tolerance test. For further information please visit [email or web address]. Please also follow instruction sheet, as given.  Follow-Up: AS NEEDED   Any Other Special Instructions Will Be Listed Below (If Applicable).  Exercise Stress Test An exercise stress test is a test to check how your heart works during exercise. You will need to walk on a treadmill or ride an exercise bike for this test. An electrocardiogram (ECG) will record your heartbeat when you are at rest and when you are exercising. You may have an ultrasound or nuclear test after the exercise test. The test is done to check for coronary artery disease (CAD). It is also done to:  See how well you can exercise.  Watch for high blood pressure during exercise.  Test how well you can exercise after treatment.  Check the blood flow to your arms and legs. If your test result is not normal, more testing may be needed. What happens before the procedure?  Follow instructions from your doctor about what you cannot eat or drink. ? Do not have any drinks or foods that have caffeine in them for 24 hours before the test, or as told by your doctor. This includes coffee, tea (even decaf tea), sodas, chocolate, and cocoa.  Ask your doctor about changing or stopping your normal medicines. This is important if you: ? Take diabetes medicines. ? Take beta-blocker medicines. ? Wear a nitroglycerin patch.  If you use an inhaler, bring it with you to the test.  Do not put lotions, powders, creams, or oils on your chest before the test.  Wear comfortable shoes and clothing.  Do not use any products that have nicotine or tobacco in them, such as cigarettes and e-cigarettes. Stop  using them at least 4 hours before the test. If you need help quitting, ask your doctor. What happens during the procedure?  Patches (electrodes) will be put on your chest.  Wires will be connected to the patches. The wires will send signals to a machine to record your heartbeat.  Your heart rate will be watched while you are resting and while you are exercising. Your blood pressure will also be watched during the test.  You will walk on a treadmill or use an exercise bike. If you cannot use these, you may be asked to turn a crank with your hands.  The activity will get harder and will raise your heart rate.  You may be asked to breathe into a tube a few times during the test. This measures the gases that you breathe out.  You will be asked how you are feeling throughout the test.  You will exercise until your heart reaches a target heart rate. You will stop early if: ? You feel dizzy. ? You have chest pain. ? You are out of breath. ? Your blood pressure is too high or too low. ? You have an irregular heartbeat. ? You have pain or aching in your arms or legs. The procedure may vary among doctors and hospitals.   What happens after the procedure?  Your blood pressure, heart rate, breathing rate, and blood oxygen level will be watched after the test.  You may return to your normal diet and activities as told by your doctor.  It is up to  you to get the results of your test. Ask your doctor, or the department that is doing the test, when your results will be ready. Summary  An exercise stress test is a test to check how your heart works during exercise.  This test is done to check for coronary artery disease.  Your heart rate will be watched while you are resting and while you are exercising.  Follow instructions from your doctor about what you cannot eat or drink before the test. This information is not intended to replace advice given to you by your health care provider. Make  sure you discuss any questions you have with your health care provider. Document Revised: 09/25/2019 Document Reviewed: 09/25/2019 Elsevier Patient Education  [place].

## 2020-06-21 ENCOUNTER — Telehealth (HOSPITAL_COMMUNITY): Payer: Self-pay | Admitting: *Deleted

## 2020-06-21 DIAGNOSIS — Z780 Asymptomatic menopausal state: Secondary | ICD-10-CM | POA: Diagnosis not present

## 2020-06-21 NOTE — Telephone Encounter (Signed)
Close encounter 

## 2020-06-22 ENCOUNTER — Ambulatory Visit (HOSPITAL_COMMUNITY)
Admission: RE | Admit: 2020-06-22 | Discharge: 2020-06-22 | Disposition: A | Discharge status: Home / Self Care | Payer: Medicare Other | Source: Ambulatory Visit | Attending: Cardiology | Admitting: Cardiology

## 2020-06-22 ENCOUNTER — Other Ambulatory Visit: Payer: Self-pay

## 2020-06-22 DIAGNOSIS — I10 Essential (primary) hypertension: Secondary | ICD-10-CM | POA: Diagnosis not present

## 2020-06-22 DIAGNOSIS — R072 Precordial pain: Secondary | ICD-10-CM | POA: Insufficient documentation

## 2020-06-22 LAB — EXERCISE TOLERANCE TEST
Estimated workload: 7 METS
Exercise duration (min): 5 min
Exercise duration (sec): 10 s
MPHR: 150 {beats}/min
Peak HR: 160 {beats}/min
Percent HR: 106 %
Rest HR: 65 {beats}/min

## 2020-06-26 ENCOUNTER — Other Ambulatory Visit: Payer: Self-pay | Admitting: Family Medicine

## 2020-06-29 ENCOUNTER — Telehealth: Payer: Self-pay | Admitting: Family Medicine

## 2020-06-29 NOTE — Telephone Encounter (Signed)
PATIENT AWARE OF ALL RESULTS

## 2020-07-05 ENCOUNTER — Other Ambulatory Visit: Payer: Self-pay

## 2020-07-05 ENCOUNTER — Other Ambulatory Visit: Payer: Medicare Other

## 2020-07-05 DIAGNOSIS — E039 Hypothyroidism, unspecified: Secondary | ICD-10-CM

## 2020-07-06 LAB — THYROID PANEL WITH TSH
Free Thyroxine Index: 3 (ref 1.2–4.9)
T3 Uptake Ratio: 30 % (ref 24–39)
T4, Total: 10 ug/dL (ref 4.5–12.0)
TSH: 0.8 u[IU]/mL (ref 0.450–4.500)

## 2020-07-15 ENCOUNTER — Other Ambulatory Visit: Payer: Self-pay

## 2020-07-15 ENCOUNTER — Ambulatory Visit (INDEPENDENT_AMBULATORY_CARE_PROVIDER_SITE_OTHER): Payer: Medicare Other | Admitting: Pharmacist

## 2020-07-15 ENCOUNTER — Encounter: Payer: Self-pay | Admitting: Pharmacist

## 2020-07-15 DIAGNOSIS — J4530 Mild persistent asthma, uncomplicated: Secondary | ICD-10-CM | POA: Diagnosis not present

## 2020-07-15 MED ORDER — BUDESONIDE-FORMOTEROL FUMARATE 80-4.5 MCG/ACT IN AERO: 2.0000 | INHALATION_SPRAY | Freq: Two times a day (BID) | RESPIRATORY_TRACT | 3 refills | Status: DC

## 2020-07-15 NOTE — Progress Notes (Signed)
HPI Patient presents today to see pharmacy team for asthma inhaler assistance.  She has recently been diagnosed with asthma.  She states that her husband smokes at home and she is often bothered by the secondhand smoke.  The pollen is also another trigger for her breathing/SOB.  Number of hospitalizations in past year:0 Number of asthma exacerbations in past year: 0  Respiratory Medications Current: symbicort, albuterol Tried in past: n/a Patient reports  financial difficulties--inhalers all costly  OBJECTIVE Allergies  Allergen Reactions   Anesthesia S-I-40 [Propofol]     Per patient need to be careful when giving this    Outpatient Encounter Medications as of 07/15/2020  Medication Sig   budesonide-formoterol (SYMBICORT) 80-4.5 MCG/ACT inhaler Inhale 2 puffs into the lungs 2 (two) times daily. Rinse mouth after each use   dicyclomine (BENTYL) 20 MG tablet Take 20 mg by mouth 3 (three) times daily.   EUTHYROX 75 MCG tablet TAKE 1 TABLET BY MOUTH ONCE DAILY BEFORE BREAKFAST   fluticasone (FLONASE) 50 MCG/ACT nasal spray Place 2 sprays into both nostrils daily.   hydrOXYzine (ATARAX/VISTARIL) 10 MG tablet Take 1 tablet (10 mg total) by mouth 3 (three) times daily as needed for itching.   levocetirizine (XYZAL) 5 MG tablet Take by mouth.   losartan-hydrochlorothiazide (HYZAAR) 100-25 MG tablet Take 1 tablet by mouth once daily   meclizine (ANTIVERT) 25 MG tablet TAKE 1 TABLET BY MOUTH THREE TIMES DAILY AS NEEDED FOR DIZZINESS   meloxicam (MOBIC) 15 MG tablet Take 1 tablet (15 mg total) by mouth daily. (ONLY ONCE DAILY if needed for joint/ back pain)   methocarbamol (ROBAXIN) 500 MG tablet Take 1 tablet (500 mg total) by mouth 4 (four) times daily as needed for muscle spasms.   montelukast (SINGULAIR) 10 MG tablet Take 1 tablet (10 mg total) by mouth at bedtime. (for allergy/ asthma)   potassium chloride SA (KLOR-CON) 20 MEQ tablet Take 1 tablet (20 mEq total) by mouth daily.    predniSONE (STERAPRED UNI-PAK 21 TAB) 10 MG (21) TBPK tablet 6 tablets day 1, 5 tablet day 2, 4 tablet day 3, 3 tablet day 4, 2 tablet day 5, 1 tablet day 6   PROAIR HFA 108 (90 Base) MCG/ACT inhaler INHALE 2 PUFFS BY MOUTH EVERY 6 HOURS AS NEEDED FOR WHEEZING OR SHORTNESS OF BREATH   Wheat Dextrin (BENEFIBER PO) Take by mouth.   No facility-administered encounter medications on file as of 07/15/2020.     Immunization History  Administered Date(s) Administered   Influenza, High Dose Seasonal PF 11/15/2015, 02/24/2017, 12/04/2017   Influenza,inj,Quad PF,6+ Mos 02/19/2017   Influenza-Unspecified 01/06/2020   PFIZER(Purple Top)SARS-COV-2 Vaccination 02/24/2019, 03/17/2019, 11/29/2019   Pneumococcal Conjugate-13 06/23/2019   Pneumococcal Polysaccharide-23 03/28/2016   Tdap 06/15/2015     PFTs PFT Results Latest Ref Rng & Units 09/17/2018  FVC-Pre L 2.60  FVC-Predicted Pre % 109  FVC-Post L 2.61  FVC-Predicted Post % 109  Pre FEV1/FVC % % 80  Post FEV1/FCV % % 83  FEV1-Pre L 2.07  FEV1-Predicted Pre % 112  FEV1-Post L 2.16  DLCO uncorrected ml/min/mmHg 22.56  DLCO UNC% % 117  DLCO corrected ml/min/mmHg 21.80  DLCO COR %Predicted % 113  DLVA Predicted % 120  TLC L 4.67  TLC % Predicted % 93  RV % Predicted % 97     Eosinophils Most recent blood eosinophil count was 2 cells/microL taken on 06/23/2019.   IgE   Assessment   Inhaler Optimization  Optimal inhaler for  patient would be symbicort considering patient reported controlled response.  Patient was counseled on the purpose, proper use, and adverse effects of symbicort inhaler.  Instructed patient to rinse mouth with water after using in order to prevent fungal infection.  Patient verbalized understanding.  Reviewed appropriate use of maintenance vs rescue inhalers.  Stressed importance of using maintenance inhaler daily and rescue inhaler only as needed.  Patient verbalized understanding. 2. Medication  Reconciliation  A drug regimen assessment was performed, including review of allergies, interactions, disease-state management, dosing and immunization history. Medications were reviewed with the patient, including name, instructions, indication, goals of therapy, potential side effects, importance of adherence, and safe use.  Drug interaction(s): n/a  3.  Immunizations  Patient is indicated for the influenzae, pneumonia, and shingles vaccinations.  PLAN Continue Symbicort 80/4.5 Continue albuterol as rescue inhaler Submitted application for [place] and [place] patient assistance program for Symbicort for 0263 Application filled out with patient in office  Medication will ship to patient's home RX escribed to [facility] with 3 refills Avoid triggers as able   All questions encouraged and answered.  Instructed patient to reach out with any further questions or concerns.  Thank you for allowing pharmacy to participate in this patient's care.  This appointment required 25 minutes of patient care (this  includes precharting, chart review, review of results, face-to-face care, etc.).   Regina Eck, PharmD, BCPS Clinical Pharmacist, [facility] Phone [phone]

## 2020-08-05 ENCOUNTER — Telehealth: Payer: Self-pay | Admitting: Family Medicine

## 2020-08-05 DIAGNOSIS — J4530 Mild persistent asthma, uncomplicated: Secondary | ICD-10-CM

## 2020-08-05 DIAGNOSIS — J301 Allergic rhinitis due to pollen: Secondary | ICD-10-CM

## 2020-08-05 DIAGNOSIS — E876 Hypokalemia: Secondary | ICD-10-CM

## 2020-08-05 DIAGNOSIS — J9809 Other diseases of bronchus, not elsewhere classified: Secondary | ICD-10-CM

## 2020-08-05 DIAGNOSIS — R42 Dizziness and giddiness: Secondary | ICD-10-CM

## 2020-08-05 MED ORDER — MELOXICAM 15 MG PO TABS: 15.0000 mg | ORAL_TABLET | Freq: Every day | ORAL | 3 refills | Status: DC

## 2020-08-05 MED ORDER — POTASSIUM CHLORIDE CRYS ER 20 MEQ PO TBCR: 20.0000 meq | EXTENDED_RELEASE_TABLET | Freq: Every day | ORAL | 3 refills | Status: DC

## 2020-08-05 MED ORDER — LOSARTAN POTASSIUM-HCTZ 100-25 MG PO TABS: 1.0000 | ORAL_TABLET | Freq: Every day | ORAL | 1 refills | Status: DC

## 2020-08-05 MED ORDER — BUDESONIDE-FORMOTEROL FUMARATE 80-4.5 MCG/ACT IN AERO: 2.0000 | INHALATION_SPRAY | Freq: Two times a day (BID) | RESPIRATORY_TRACT | 3 refills | Status: DC

## 2020-08-05 MED ORDER — MONTELUKAST SODIUM 10 MG PO TABS: 10.0000 mg | ORAL_TABLET | Freq: Every day | ORAL | 3 refills | Status: DC

## 2020-08-05 MED ORDER — METHOCARBAMOL 500 MG PO TABS: 500.0000 mg | ORAL_TABLET | Freq: Four times a day (QID) | ORAL | 0 refills | Status: DC | PRN

## 2020-08-05 MED ORDER — FLUTICASONE PROPIONATE 50 MCG/ACT NA SUSP: 2.0000 | Freq: Every day | NASAL | 6 refills | Status: DC

## 2020-08-05 MED ORDER — MECLIZINE HCL 25 MG PO TABS: ORAL_TABLET | ORAL | 0 refills | Status: DC

## 2020-08-05 MED ORDER — PROAIR HFA 108 (90 BASE) MCG/ACT IN AERS: INHALATION_SPRAY | RESPIRATORY_TRACT | 0 refills | Status: DC

## 2020-08-05 MED ORDER — HYDROXYZINE HCL 10 MG PO TABS: 10.0000 mg | ORAL_TABLET | Freq: Three times a day (TID) | ORAL | 0 refills | Status: DC | PRN

## 2020-08-05 NOTE — Telephone Encounter (Signed)
Please transfer all Rx's to [facility]

## 2020-09-22 ENCOUNTER — Other Ambulatory Visit: Payer: Self-pay | Admitting: *Deleted

## 2020-09-22 MED ORDER — LEVOTHYROXINE SODIUM 75 MCG PO TABS: 75.0000 ug | ORAL_TABLET | Freq: Every day | ORAL | 2 refills | Status: DC

## 2020-11-16 DIAGNOSIS — K581 Irritable bowel syndrome with constipation: Secondary | ICD-10-CM | POA: Diagnosis not present

## 2020-11-29 ENCOUNTER — Other Ambulatory Visit: Payer: Self-pay | Admitting: Family Medicine

## 2020-11-29 DIAGNOSIS — E7800 Pure hypercholesterolemia, unspecified: Secondary | ICD-10-CM

## 2020-11-29 MED ORDER — ROSUVASTATIN CALCIUM 10 MG PO TABS: 10.0000 mg | ORAL_TABLET | Freq: Every day | ORAL | 3 refills | Status: DC

## 2021-01-02 ENCOUNTER — Other Ambulatory Visit: Payer: Self-pay | Admitting: Family Medicine

## 2021-01-02 DIAGNOSIS — Z1231 Encounter for screening mammogram for malignant neoplasm of breast: Secondary | ICD-10-CM

## 2021-02-08 ENCOUNTER — Other Ambulatory Visit: Payer: Self-pay | Admitting: Family Medicine

## 2021-02-15 ENCOUNTER — Ambulatory Visit
Admission: RE | Admit: 2021-02-15 | Discharge: 2021-02-15 | Disposition: A | Discharge status: Home / Self Care | Payer: Medicare Other | Source: Ambulatory Visit | Attending: Family Medicine | Admitting: Family Medicine

## 2021-02-15 DIAGNOSIS — Z1231 Encounter for screening mammogram for malignant neoplasm of breast: Secondary | ICD-10-CM | POA: Diagnosis not present

## 2021-03-10 ENCOUNTER — Telehealth: Payer: Self-pay | Admitting: Pharmacist

## 2021-03-10 NOTE — Telephone Encounter (Signed)
Breztri sample given Patient still having difficulty breathing on symbicort May benefit from triple therapy

## 2021-05-09 ENCOUNTER — Other Ambulatory Visit: Payer: Self-pay | Admitting: Family Medicine

## 2021-05-31 ENCOUNTER — Ambulatory Visit (INDEPENDENT_AMBULATORY_CARE_PROVIDER_SITE_OTHER): Payer: Medicare Other | Admitting: Emergency Medicine

## 2021-05-31 DIAGNOSIS — Z23 Encounter for immunization: Secondary | ICD-10-CM

## 2021-06-03 ENCOUNTER — Other Ambulatory Visit: Payer: Self-pay | Admitting: Family Medicine

## 2021-06-04 ENCOUNTER — Other Ambulatory Visit: Payer: Self-pay | Admitting: Family Medicine

## 2021-06-05 ENCOUNTER — Encounter: Payer: Self-pay | Admitting: Family Medicine

## 2021-06-05 ENCOUNTER — Ambulatory Visit: Payer: Medicare Other

## 2021-06-05 NOTE — Telephone Encounter (Signed)
NTBS 30 days given  ?

## 2021-06-05 NOTE — Telephone Encounter (Signed)
Letter mailed

## 2021-06-08 ENCOUNTER — Telehealth: Payer: Self-pay | Admitting: Pharmacist

## 2021-06-08 DIAGNOSIS — J4530 Mild persistent asthma, uncomplicated: Secondary | ICD-10-CM

## 2021-06-08 MED ORDER — BUDESONIDE-FORMOTEROL FUMARATE 80-4.5 MCG/ACT IN AERO: 2.0000 | INHALATION_SPRAY | Freq: Two times a day (BID) | RESPIRATORY_TRACT | 5 refills | Status: DC

## 2021-06-08 NOTE — Telephone Encounter (Signed)
SYMICORT REFILLS FOR AZ&ME PATIENT ASSISTANCE SENT TO [facility] ? ?

## 2021-06-10 IMAGING — CT CT ABDOMEN AND PELVIS WITH CONTRAST
2 of 5 series · 16 of 46 positions shown, 18 images · IV contrast (ISOVUE)
Comparison: September 05, 2018

CLINICAL DATA: Abdominal distention. Recent diverticular abscess.
Increasing pain and vomiting.

EXAM:
CT ABDOMEN AND PELVIS WITH CONTRAST
TECHNIQUE: Multidetector CT imaging of the abdomen and pelvis was performed
using the standard protocol following bolus administration of
intravenous contrast.
CONTRAST:  100mL OMNIPAQUE IOHEXOL 300 MG/ML  SOLN

[Series 2: axial st · axial · 0.76mm/px · z∈[-504,-84]mm · 13 of 97 slices shown, 15 images]
[im 7/97  soft-tissue]
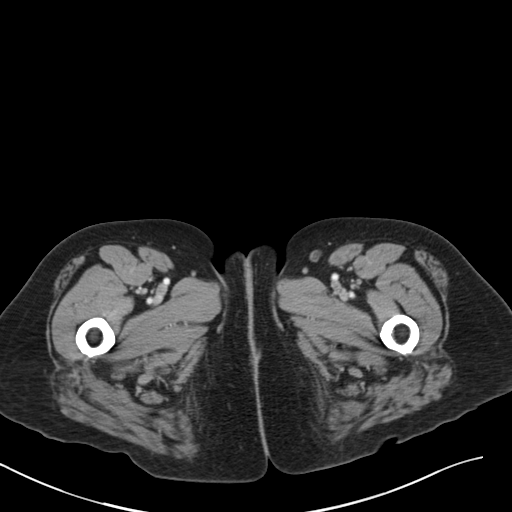
[im 7/97  bone]
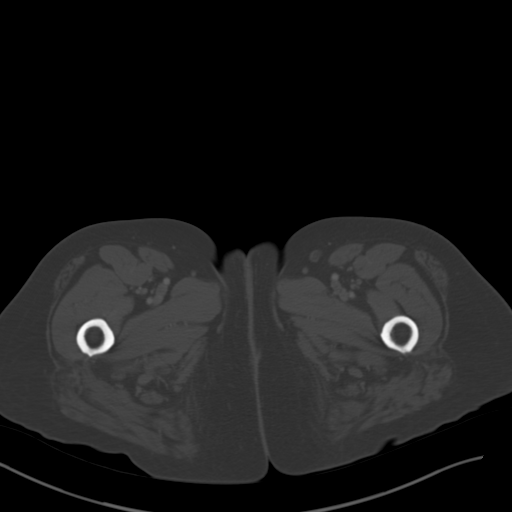
[im 13/97  soft-tissue]
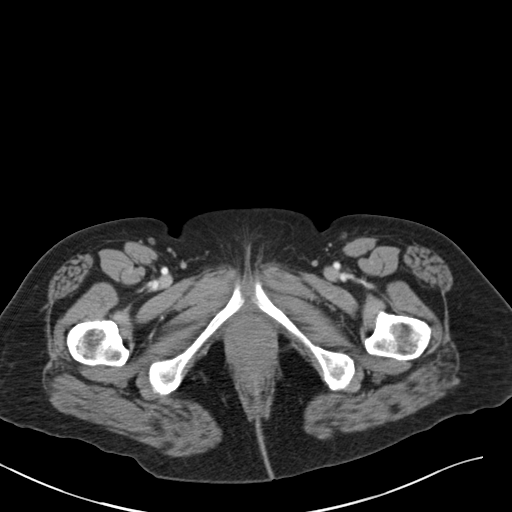
[im 19/97  soft-tissue]
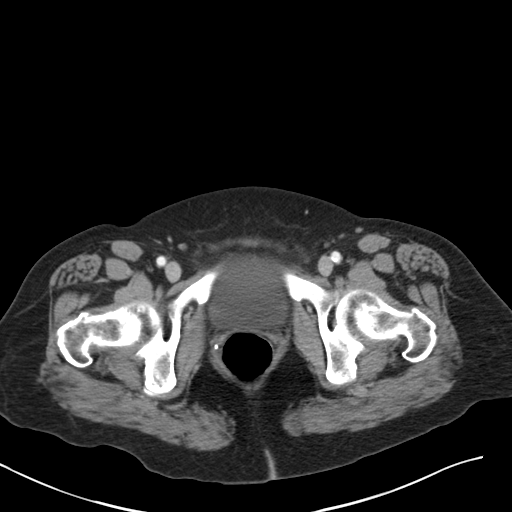
[im 31/97  soft-tissue]
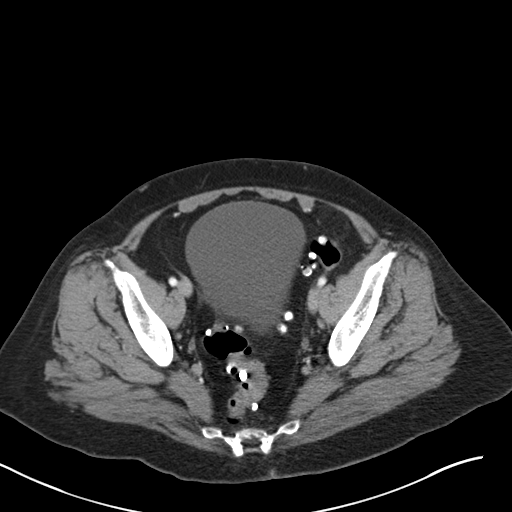
[im 37/97  soft-tissue]
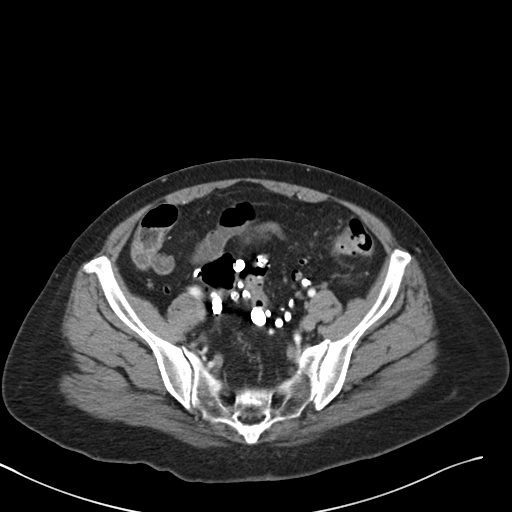
[im 43/97  soft-tissue]
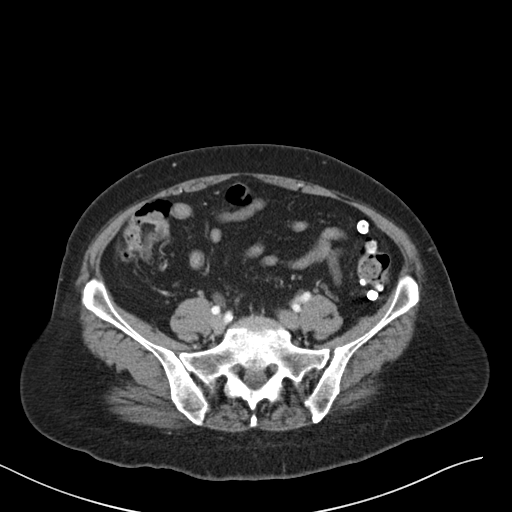
[im 49/97  soft-tissue]
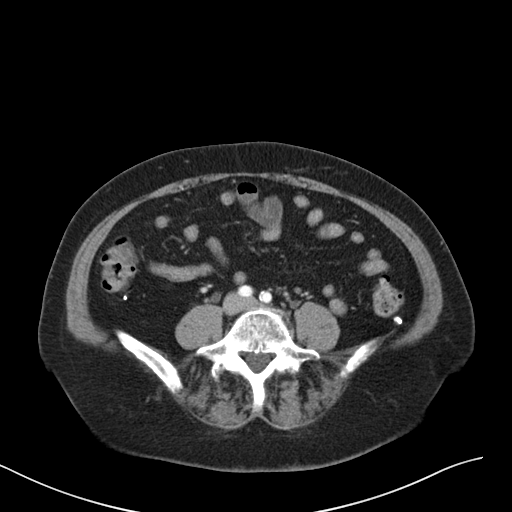
[im 55/97  soft-tissue]
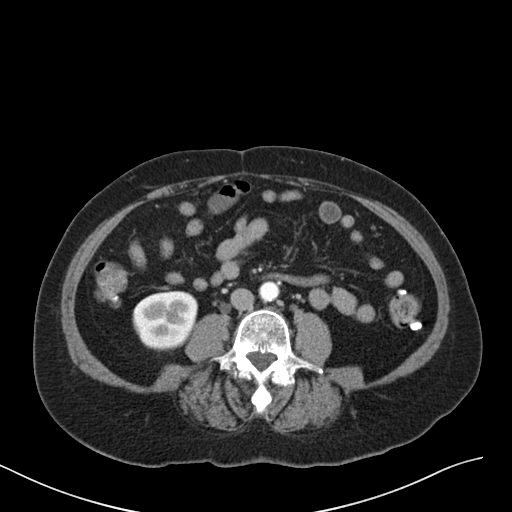
[im 61/97  soft-tissue]
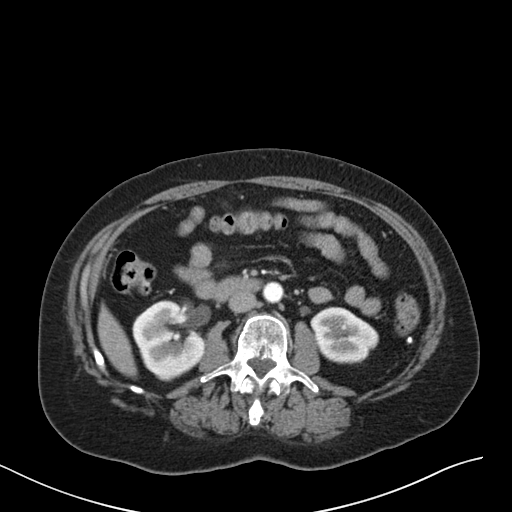
[im 61/97  bone]
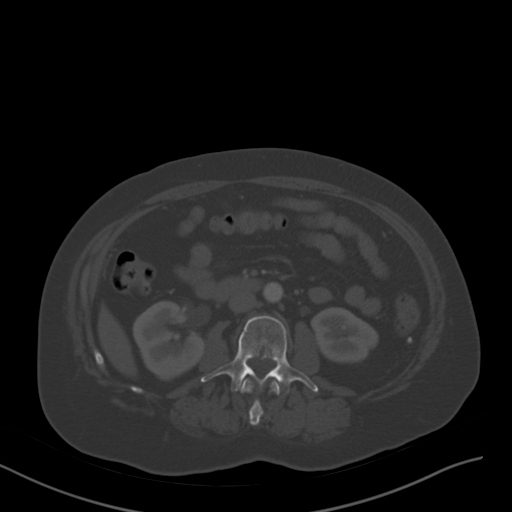
[im 67/97  soft-tissue]
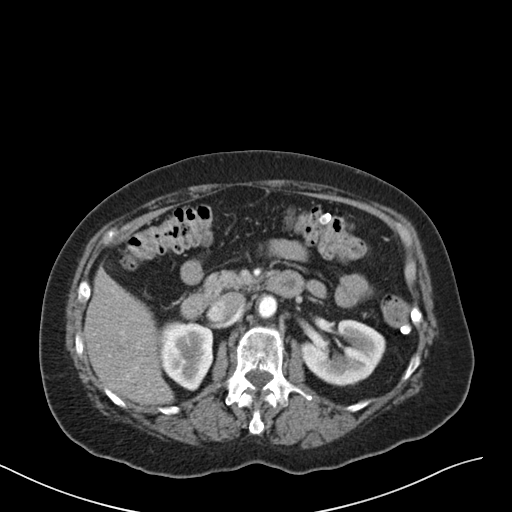
[im 79/97  soft-tissue]
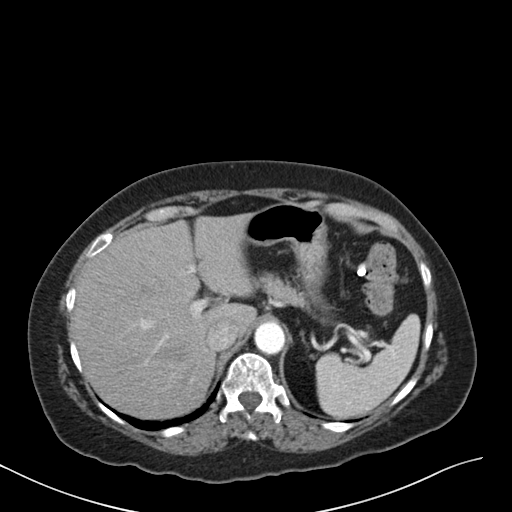
[im 85/97  soft-tissue]
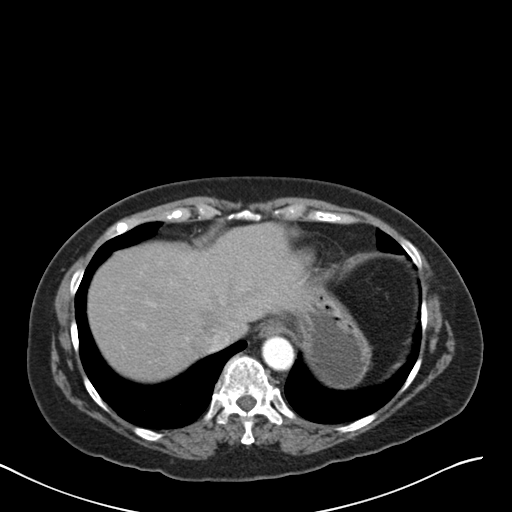
[im 91/97  soft-tissue]
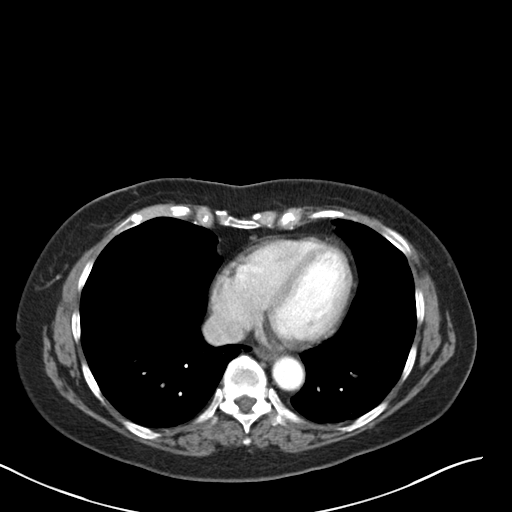

[Series 4: coronal st · coronal · 0.79mm/px · 3 of 119 slices shown]
[im 40/119  soft-tissue]
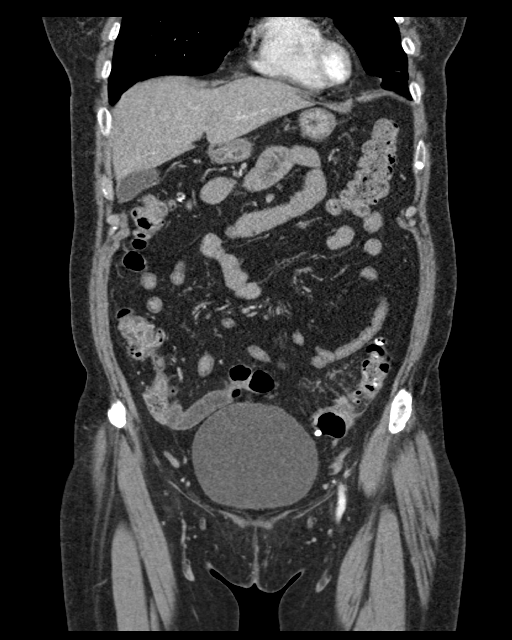
[im 53/119  soft-tissue]
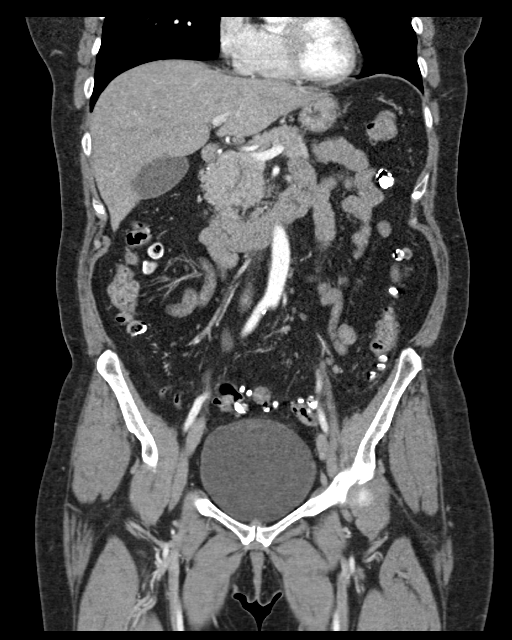
[im 66/119  soft-tissue]
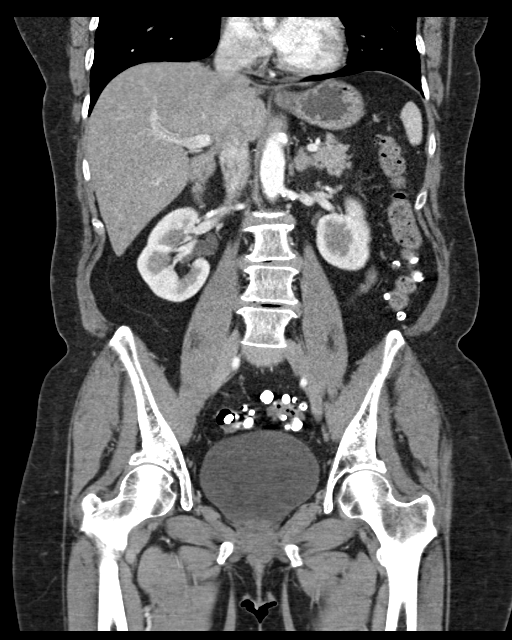

[16 of 46 positions shown; findings below may reference images not displayed]

FINDINGS: Lower chest: Lung bases are clear. No effusions. Heart is normal
size.

Hepatobiliary: No focal hepatic abnormality. Gallbladder
unremarkable.

Pancreas: No focal abnormality or ductal dilatation.

Spleen: No focal abnormality.  Normal size.

Adrenals/Urinary Tract: No adrenal abnormality. No focal renal
abnormality. No stones or hydronephrosis. Urinary bladder is
unremarkable.

Stomach/Bowel: Extensive colonic diverticulosis diffusely.
Previously seen wall thickening and inflammation around the proximal
sigmoid colon have improved. Slight residual stranding noted. No
focal abscess. Appendix is normal. Stomach and small bowel
decompressed, unremarkable.

Vascular/Lymphatic: No evidence of aneurysm or adenopathy.

Reproductive: Prior hysterectomy.  No adnexal masses.

Other: No free fluid or free air.

Musculoskeletal: No acute bony abnormality.
IMPRESSION: Diffuse colonic diverticulosis. Improving diverticulitis changes
around the proximal sigmoid colon with minimal residual surrounding
stranding. No focal abscess.

## 2021-06-14 NOTE — Progress Notes (Signed)
? ?New Patient Note ? ?RE: Diane Grant MRN: [id number] DOB: 03-03-1950 ?Date of Office Visit: 06/15/2021 ? ?Consult requested by: [name], DO ?Primary care provider: [name], DO ? ?Chief Complaint: Allergies ? ?History of Present Illness: ?I had the pleasure of seeing Diane Grant for initial evaluation at the [facility] on 06/16/2021. She is a 71 y.o. female, who is self-referred here for the evaluation of allergies and asthma. ? ?Breathing:  ?She reports symptoms of chest tightness, shortness of breath, coughing, wheezing for many years but worsening the past few years. Current medications include Symbicort prn, Breztri prn no benefit. She reports not using aerochamber with inhalers. She tried the following inhalers: none. Main triggers are smoke - husband smokes indoors, strong scents. In the last month, frequency of symptoms: daily. Frequency of nocturnal symptoms: 0x/month. Frequency of SABA use: 0x/week. In the last 12 months, emergency room visits/urgent care visits/doctor office visits or hospitalizations due to respiratory issues: 0. In the last 12 months, oral steroids courses: no. Lifetime history of hospitalization for respiratory issues: no. Prior intubations: no. History of pneumonia: no. She was not evaluated by pulmonologist in the past. Smoking exposure: husband smokes indoors. Up to date with flu vaccine: yes. Up to date with pneumonia vaccine: no. Up to date with COVID-19 vaccine: yes. Prior Covid-19 infection: no. ?History of reflux: denies. ? ?She reports symptoms of itchy/watery eyes, rhinorrhea, sneezing, nasal congestion. Symptoms have been going on for few years. The symptoms are present mainly in the spring and summer. Anosmia: no. Headache: sometimes. She has used singulair, OTC nasal spray with no improvement in symptoms. Sinus infections: no. Previous work up includes: 2020 IgE 740. ?Previous ENT evaluation: no. ?Previous sinus  imaging: no. ?History of nasal polyps: no. ?Last eye exam: last year. ? ?06/20/2020 cardiology visit: ?"CHEST PAIN:   Chest pain has nonanginal greater than anginal features.  She does have risk factors.  Given this I would like to bring her back for a POET (Plain Old Exercise Treadmill) ?  ?HTN: Her blood pressure is at target.  She will continue the meds as listed. ?  ?DYSLIPIDEMIA: I do note that her LDL was 122 couple of days ago with an HDL of 38.  I would suggest dietary changes and repeating this in about 3 to 6 months.  If her LDL is not less than 100 it might be reasonable to guide her with a coronary calcium score to discuss goals of therapy." ? ?06/20/2018 pulmonology visit: ?"Evaluation for dyspnea ?She likely had episode of bronchitis, reactive airways after flu and viral illness earlier this year.  Her COVID PCR testing was negative x 2 .  She is requesting a COVID antibody testing done ?  ?Labs show elevated IgE which is likely from allergies.  PFTs are normal with no evidence of obstruction or asthma. ?As her symptoms are improved we will continue to observe off inhalers.  Follow-up in 6 months " ? ?06/17/2020 CXR: ?"IMPRESSION: ?No active cardiopulmonary disease." ? ?Assessment and Plan: ?Diane Grant is a 71 y.o. female with: ?Shortness of breath ?Daily respiratory symptoms for many years. Tried Symbicort and Breztri but patient only taking prn. Second hand tobacco smoke exposure through husband - married 50+ years. Saw pulmonology in 2020 and CXR was unremarkable in 2022. Evaluated by cardiology as well. ?Today's spirometry showed: restrictive disease with 7% improvement in FEV1 post bronchodilator treatment. Clinically feeling slightly improved.  ?Today's skin prick testing showed: Positive to  dust mites. Borderline to shellfish and shrimp. ?Given clinical history and smoking exposure concerning for COPD.  ?Daily controller medication(s): start Breztri 2 puffs twice a day with spacer and rinse mouth  afterwards. ?Spacer prescription sent in demonstrated proper use with inhaler. Patient understood technique and all questions/concerned were addressed.  ?May use albuterol rescue inhaler 2 puffs every 4 to 6 hours as needed for shortness of breath, chest tightness, coughing, and wheezing. May use albuterol rescue inhaler 2 puffs 5 to 15 minutes prior to strenuous physical activities. Monitor frequency of use.  ?Get spirometry at next visit. ? ?Other allergic rhinitis ?Rhino conjunctivitis symptoms mainly in the spring and fall. No prior allergy testing but IgE was 740 in 2020. ?Today's skin prick testing showed: Positive to dust mites.  ?Will get bloodwork instead of intradermal testing as need to order bloodwork for reasons below.  ?Start environmental control measures as below. ?Start Singulair (montelukast) '10mg'$  daily at night. ?Cautioned that in some children/adults can experience behavioral changes including hyperactivity, agitation, depression, sleep disturbances and suicidal ideations. These side effects are rare, but if you notice them you should notify me and discontinue Singulair (montelukast). ? ?Urticaria ?On and off for many years. No prior work up. Noticed hives/rash after eating strawberries and tomatoes but not after every ingestion. ?Today's skin testing was negative to strawberries and tomatoes. ?Discussed with patient that you can't be allergic to a food one day and not the next day. Most likely the hives/rash were not caused by the above mentioned foods.  ?Keep track of rashes. ?Get bloodwork. ? ?Return in about 2 months (around 08/15/2021). ? ?Meds ordered this encounter  ?Medications  ? albuterol (VENTOLIN HFA) 108 (90 Base) MCG/ACT inhaler  ?  Sig: Inhale 2 puffs into the lungs every 4 (four) hours as needed for wheezing or shortness of breath (coughing fits).  ?  Dispense:  18 g  ?  Refill:  1  ? Spacer/Aero-Holding Chambers (AEROCHAMBER PLUS) inhaler  ?  Sig: Use as instructed  ?  Dispense:   1 each  ?  Refill:  2  ? ?Lab Orders    ?     Alpha-1-antitrypsin    ?     Alpha-Gal Panel    ?     Allergens w/Total IgE Area 2    ?     ANA w/Reflex    ?     C3 and C4    ?     CBC with Differential/Platelet    ?     Chronic Urticaria    ?     C-reactive protein    ?     Comprehensive metabolic panel    ?     Food Allergy Profile    ?     Sedimentation rate    ?     Tryptase    ?     Thyroid Cascade Profile    ?     Protein Electrophoresis, Urine Rflx.    ?     Protein electrophoresis, serum    ? ? ?Other allergy screening: ?Food allergy:  ?Strawberries and tomatoes sometimes causes hives but not everytime after she eats it.  ? ?Medication allergy: yes ?Hymenoptera allergy: no ?Urticaria: yes ?Sometimes no triggers noted. ? ?Eczema:no ?History of recurrent infections suggestive of immunodeficency: no ? ?Diagnostics: ?Spirometry:  ?Tracings reviewed. Her effort: Good reproducible efforts. ?FVC: 2.36L ?FEV1: 1.75L, 66% predicted ?FEV1/FVC ratio: 74% ?Interpretation: Spirometry consistent with possible restrictive disease with 7% improvement  in FEV1 post bronchodilator treatment. Clinically feeling slightly improved.  ? ?Please see scanned spirometry results for details. ? ?Skin Testing: Environmental allergy panel and select foods. ?Positive to dust mites. Borderline to shellfish and shrimp. ?Results discussed with patient/family. ? Pediatric Percutaneous Testing - 06/15/21 1433   ? ? Time Antigen Placed 4193   ? Allergen Manufacturer Lavella Hammock   ? Location Back   ? Number of Test 32   ? Pediatric Panel Foods   ? 1. Control-buffer 50% Glycerol Negative   ? 2. Control-Histamine'1mg'$ /ml 2+   ? 3. Peanut Negative   ? 4. Soy bean food Negative   ? 5. Wheat, whole Negative   ? 6. Sesame Negative   ? 7. Milk, cow Negative   ? 8. Egg white, chicken Negative   ? 9. Casein Negative   ? 10. Cashew Negative   ? 11. Pecan  Negative   ? 12. [place] Negative   ? 13. Shellfish --   +/-  ? 14. Shrimp --   +/-  ? 15. Fish Mix Negative   ?  16. Flounder Negative   ? 17. Pork Negative   ? 18. [place] Meat Negative   ? 19. Beef Negative   ? 20. Lamb Negative   ? 21. Chicken Meat Negative   ? 22. Rice Negative   ? 23. White Potato Negative   ?

## 2021-06-15 ENCOUNTER — Ambulatory Visit: Payer: Medicare Other | Admitting: Allergy

## 2021-06-15 ENCOUNTER — Encounter: Payer: Self-pay | Admitting: Allergy

## 2021-06-15 VITALS — BP 102/72 | HR 70 | Temp 97.9°F | Resp 20 | Ht 62.0 in | Wt 153.0 lb

## 2021-06-15 DIAGNOSIS — R0602 Shortness of breath: Secondary | ICD-10-CM

## 2021-06-15 DIAGNOSIS — Z7722 Contact with and (suspected) exposure to environmental tobacco smoke (acute) (chronic): Secondary | ICD-10-CM | POA: Diagnosis not present

## 2021-06-15 DIAGNOSIS — T781XXD Other adverse food reactions, not elsewhere classified, subsequent encounter: Secondary | ICD-10-CM

## 2021-06-15 DIAGNOSIS — L509 Urticaria, unspecified: Secondary | ICD-10-CM | POA: Diagnosis not present

## 2021-06-15 DIAGNOSIS — T781XXA Other adverse food reactions, not elsewhere classified, initial encounter: Secondary | ICD-10-CM | POA: Diagnosis not present

## 2021-06-15 DIAGNOSIS — J3089 Other allergic rhinitis: Secondary | ICD-10-CM

## 2021-06-15 MED ORDER — ALBUTEROL SULFATE HFA 108 (90 BASE) MCG/ACT IN AERS: 2.0000 | INHALATION_SPRAY | RESPIRATORY_TRACT | 1 refills | Status: DC | PRN

## 2021-06-15 MED ORDER — AEROCHAMBER PLUS MISC: 2 refills | Status: AC

## 2021-06-15 NOTE — Patient Instructions (Addendum)
Today's skin testing showed: ?Positive to dust mites. Borderline to shellfish and shrimp. ? ?Results given. ? ?Breathing  ?Daily controller medication(s): start Breztri 2 puffs twice a day with spacer and rinse mouth afterwards. ?Spacer prescription sent in demonstrated proper use with inhaler. Patient understood technique and all questions/concerned were addressed.  ?May use albuterol rescue inhaler 2 puffs every 4 to 6 hours as needed for shortness of breath, chest tightness, coughing, and wheezing. May use albuterol rescue inhaler 2 puffs 5 to 15 minutes prior to strenuous physical activities. Monitor frequency of use.  ?Breathing control goals:  ?Full participation in all desired activities (may need albuterol before activity) ?Albuterol use two times or less a week on average (not counting use with activity) ?Cough interfering with sleep two times or less a month ?Oral steroids no more than once a year ?No hospitalizations  ? ?Environmental allergies ?Start environmental control measures as below. ?Start Singulair (montelukast) '10mg'$  daily at night. ?Cautioned that in some children/adults can experience behavioral changes including hyperactivity, agitation, depression, sleep disturbances and suicidal ideations. These side effects are rare, but if you notice them you should notify me and discontinue Singulair (montelukast). ? ?Hives ?Keep track of rashes. ?Get bloodwork ?We are ordering labs, so please allow 1-2 weeks for the results to come back. ?With the newly implemented Cures Act, the labs might be visible to you at the same time that they become visible to me. However, I will not address the results until all of the results are back, so please be patient.  ?In the meantime, continue recommendations in your patient instructions, including avoidance measures (if applicable), until you hear from me. ? ?Follow up in 2 months or sooner if needed.   ? ?Control of House Dust Mite Allergen ?Dust mite allergens are  a common trigger of allergy and asthma symptoms. While they can be found throughout the house, these microscopic creatures thrive in warm, humid environments such as bedding, upholstered furniture and carpeting. ?Because so much time is spent in the bedroom, it is essential to reduce mite levels there.  ?Encase pillows, mattresses, and box springs in special allergen-proof fabric covers or airtight, zippered plastic covers.  ?Bedding should be washed weekly in hot water (130? F) and dried in a hot dryer. Allergen-proof covers are available for comforters and pillows that can?t be regularly washed.  ?Wash the allergy-proof covers every few months. Minimize clutter in the bedroom. Keep pets out of the bedroom.  ?Keep humidity less than 50% by using a dehumidifier or air conditioning. You can buy a humidity measuring device called a hygrometer to monitor this.  ?If possible, replace carpets with hardwood, linoleum, or washable area rugs. If that's not possible, vacuum frequently with a vacuum that has a HEPA filter. ?Remove all upholstered furniture and non-washable window drapes from the bedroom. ?Remove all non-washable stuffed toys from the bedroom.  Wash stuffed toys weekly. ? ?

## 2021-06-16 NOTE — Assessment & Plan Note (Deleted)
Noticed hives/rash after eating strawberries and tomatoes but not after every ingestion. ?? Today's skin testing was negative to strawberries and tomatoes. ?Today's skin testing showed: ?Positive to dust mites. Borderline to shellfish and shrimp. ? ?Results given. ? ?Breathing  ?? Daily controller medication(s): start Breztri 2 puffs twice a day with spacer and rinse mouth afterwards. ?o Spacer prescription sent in demonstrated proper use with inhaler. Patient understood technique and all questions/concerned were addressed.  ?? May use albuterol rescue inhaler 2 puffs every 4 to 6 hours as needed for shortness of breath, chest tightness, coughing, and wheezing. May use albuterol rescue inhaler 2 puffs 5 to 15 minutes prior to strenuous physical activities. Monitor frequency of use.  ?? Breathing control goals:  ?o Full participation in all desired activities (may need albuterol before activity) ?o Albuterol use two times or less a week on average (not counting use with activity) ?o Cough interfering with sleep two times or less a month ?o Oral steroids no more than once a year ?o No hospitalizations  ? ?Environmental allergies ?? Start environmental control measures as below. ?? Start Singulair (montelukast) '10mg'$  daily at night. ?? Cautioned that in some children/adults can experience behavioral changes including hyperactivity, agitation, depression, sleep disturbances and suicidal ideations. These side effects are rare, but if you notice them you should notify me and discontinue Singulair (montelukast). ? ?Hives ?? Keep track of rashes. ?? Get bloodwork ?o We are ordering labs, so please allow 1-2 weeks for the results to come back. ?o With the newly implemented Cures Act, the labs might be visible to you at the same time that they become visible to me. However, I will not address the results until all of the results are back, so please be patient.  ?o In the meantime, continue recommendations in your patient  instructions, including avoidance measures (if applicable), until you hear from me. ?

## 2021-06-16 NOTE — Assessment & Plan Note (Addendum)
On and off for many years. No prior work up. Noticed hives/rash after eating strawberries and tomatoes but not after every ingestion. ?? Today's skin testing was negative to strawberries and tomatoes. ?? Discussed with patient that you can't be allergic to a food one day and not the next day. Most likely the hives/rash were not caused by the above mentioned foods.  ?? Keep track of rashes. ?? Get bloodwork. ?

## 2021-06-16 NOTE — Assessment & Plan Note (Signed)
Daily respiratory symptoms for many years. Tried Symbicort and Breztri but patient only taking prn. Second hand tobacco smoke exposure through husband - married 50+ years. Saw pulmonology in 2020 and CXR was unremarkable in 2022. Evaluated by cardiology as well. ?? Today's spirometry showed: restrictive disease with 7% improvement in FEV1 post bronchodilator treatment. Clinically feeling slightly improved.  ?? Today's skin prick testing showed: Positive to dust mites. Borderline to shellfish and shrimp. ?? Given clinical history and smoking exposure concerning for COPD.  ?? Daily controller medication(s): start Breztri 2 puffs twice a day with spacer and rinse mouth afterwards. ?o Spacer prescription sent in demonstrated proper use with inhaler. Patient understood technique and all questions/concerned were addressed.  ?? May use albuterol rescue inhaler 2 puffs every 4 to 6 hours as needed for shortness of breath, chest tightness, coughing, and wheezing. May use albuterol rescue inhaler 2 puffs 5 to 15 minutes prior to strenuous physical activities. Monitor frequency of use.  ?? Get spirometry at next visit. ?

## 2021-06-16 NOTE — Assessment & Plan Note (Signed)
Rhino conjunctivitis symptoms mainly in the spring and fall. No prior allergy testing but IgE was 740 in 2020. ?? Today's skin prick testing showed: Positive to dust mites.  ?? Will get bloodwork instead of intradermal testing as need to order bloodwork for reasons below.  ?? Start environmental control measures as below. ?? Start Singulair (montelukast) '10mg'$  daily at night. ?? Cautioned that in some children/adults can experience behavioral changes including hyperactivity, agitation, depression, sleep disturbances and suicidal ideations. These side effects are rare, but if you notice them you should notify me and discontinue Singulair (montelukast). ?

## 2021-06-22 ENCOUNTER — Ambulatory Visit (INDEPENDENT_AMBULATORY_CARE_PROVIDER_SITE_OTHER): Payer: Medicare Other | Admitting: Family Medicine

## 2021-06-22 ENCOUNTER — Encounter: Payer: Self-pay | Admitting: Family Medicine

## 2021-06-22 VITALS — BP 115/72 | HR 64 | Temp 97.4°F | Ht 62.0 in | Wt 154.2 lb

## 2021-06-22 DIAGNOSIS — E7800 Pure hypercholesterolemia, unspecified: Secondary | ICD-10-CM

## 2021-06-22 DIAGNOSIS — R1012 Left upper quadrant pain: Secondary | ICD-10-CM | POA: Diagnosis not present

## 2021-06-22 DIAGNOSIS — R0789 Other chest pain: Secondary | ICD-10-CM

## 2021-06-22 DIAGNOSIS — E876 Hypokalemia: Secondary | ICD-10-CM

## 2021-06-22 DIAGNOSIS — R252 Cramp and spasm: Secondary | ICD-10-CM | POA: Diagnosis not present

## 2021-06-22 DIAGNOSIS — L509 Urticaria, unspecified: Secondary | ICD-10-CM | POA: Diagnosis not present

## 2021-06-22 DIAGNOSIS — R1013 Epigastric pain: Secondary | ICD-10-CM

## 2021-06-22 DIAGNOSIS — J3089 Other allergic rhinitis: Secondary | ICD-10-CM | POA: Diagnosis not present

## 2021-06-22 DIAGNOSIS — R0602 Shortness of breath: Secondary | ICD-10-CM | POA: Diagnosis not present

## 2021-06-22 DIAGNOSIS — Z8719 Personal history of other diseases of the digestive system: Secondary | ICD-10-CM

## 2021-06-22 DIAGNOSIS — T781XXD Other adverse food reactions, not elsewhere classified, subsequent encounter: Secondary | ICD-10-CM | POA: Diagnosis not present

## 2021-06-22 MED ORDER — LEVOTHYROXINE SODIUM 75 MCG PO TABS: 75.0000 ug | ORAL_TABLET | Freq: Every day | ORAL | 3 refills | Status: DC

## 2021-06-22 MED ORDER — ROSUVASTATIN CALCIUM 10 MG PO TABS: 10.0000 mg | ORAL_TABLET | Freq: Every day | ORAL | 3 refills | Status: DC

## 2021-06-22 MED ORDER — POTASSIUM CHLORIDE CRYS ER 20 MEQ PO TBCR: 20.0000 meq | EXTENDED_RELEASE_TABLET | Freq: Every day | ORAL | 3 refills | Status: DC

## 2021-06-22 MED ORDER — LOSARTAN POTASSIUM-HCTZ 100-25 MG PO TABS: 1.0000 | ORAL_TABLET | Freq: Every day | ORAL | 3 refills | Status: DC

## 2021-06-22 MED ORDER — HYDROXYZINE HCL 10 MG PO TABS: 10.0000 mg | ORAL_TABLET | Freq: Three times a day (TID) | ORAL | 0 refills | Status: DC | PRN

## 2021-06-22 MED ORDER — MELOXICAM 15 MG PO TABS: 15.0000 mg | ORAL_TABLET | Freq: Every day | ORAL | 3 refills | Status: DC

## 2021-06-22 NOTE — Progress Notes (Signed)
Subjective: CC: Shortness of breath, rib pain, abdominal pain PCP: [name], DO [id number] Diane Grant is a 71 y.o. female presenting to clinic today for:  1.  Rib pain, shortness of breath Patient with ongoing left-sided rib pain and shortness of breath.  She had imaging done last year which was unremarkable.  She is under the care of allergy and asthma currently and being evaluated with extensive lab work to try to determine why she continues to have shortness of breath.  She sees Dr Scherrie Bateman.  Next evaluation is in about 2 months.  She was placed on Breztri and is taking this twice daily as directed but has not noticed a great deal of improvement with this medicine yet.  She has history of secondhand exposure to smoke, by her husband who smokes heavily.  She had a 30+ year secondhand exposure in the home and 20+ year as of thirdhand smoke.  She denies any hemoptysis, unplanned weight loss.  She does have rib pain along the left lateral ribs extending down to the inferior ribs.  She reports associated upper abdominal pain.  No reports of vomiting, blood in stool.  Last CAT scan was in 2020 and showed evidence of recent diverticular abscess but no other abnormalities.  Has not reached out to her GI provider about this.   ROS: Per HPI  Allergies  Allergen Reactions   Anesthesia S-I-40 [Propofol] Nausea And Vomiting    Per patient need to be careful when giving this   Past Medical History:  Diagnosis Date   Degenerative disc disease, lumbar    Hypertension    Thyroid disease    hypothyroidism    Current Outpatient Medications:    albuterol (VENTOLIN HFA) 108 (90 Base) MCG/ACT inhaler, Inhale 2 puffs into the lungs every 4 (four) hours as needed for wheezing or shortness of breath (coughing fits)., Disp: 18 g, Rfl: 1   Budeson-Glycopyrrol-Formoterol (BREZTRI AEROSPHERE) 160-9-4.8 MCG/ACT AERO, Inhale into the lungs., Disp: , Rfl:    dicyclomine (BENTYL) 20 MG tablet, Take  20 mg by mouth 3 (three) times daily., Disp: , Rfl:    EUTHYROX 75 MCG tablet, TAKE 1 TABLET BY MOUTH DAILY BEFORE BREAKFAST., Disp: 90 tablet, Rfl: 0   fluticasone (FLONASE) 50 MCG/ACT nasal spray, Place 2 sprays into both nostrils daily., Disp: 16 g, Rfl: 6   hydrOXYzine (ATARAX/VISTARIL) 10 MG tablet, Take 1 tablet (10 mg total) by mouth 3 (three) times daily as needed for itching., Disp: 40 tablet, Rfl: 0   levocetirizine (XYZAL) 5 MG tablet, Take by mouth., Disp: , Rfl:    losartan-hydrochlorothiazide (HYZAAR) 100-25 MG tablet, Take 1 tablet by mouth daily. Needs office visit for further refills, Disp: 30 tablet, Rfl: 0   meclizine (ANTIVERT) 25 MG tablet, TAKE 1 TABLET BY MOUTH THREE TIMES DAILY AS NEEDED FOR DIZZINESS, Disp: 30 tablet, Rfl: 0   meloxicam (MOBIC) 15 MG tablet, Take 1 tablet (15 mg total) by mouth daily. (ONLY ONCE DAILY if needed for joint/ back pain), Disp: 30 tablet, Rfl: 3   methocarbamol (ROBAXIN) 500 MG tablet, TAKE 1 TABLET (500 MG TOTAL) BY MOUTH 4 (FOUR) TIMES DAILY AS NEEDED FOR MUSCLE SPASMS., Disp: 40 tablet, Rfl: 0   montelukast (SINGULAIR) 10 MG tablet, TAKE 1 TABLET BY MOUTH EVERY DAY AT BEDTIME FOR ALLERGY/ASTHMA, Disp: 30 tablet, Rfl: 0   potassium chloride SA (KLOR-CON) 20 MEQ tablet, Take 1 tablet (20 mEq total) by mouth daily., Disp: 30 tablet, Rfl: 3  rosuvastatin (CRESTOR) 10 MG tablet, Take 1 tablet (10 mg total) by mouth at bedtime. For cholesterol, Disp: 90 tablet, Rfl: 3   Spacer/Aero-Holding Chambers (AEROCHAMBER PLUS) inhaler, Use as instructed, Disp: 1 each, Rfl: 2   Wheat Dextrin (BENEFIBER PO), Take by mouth., Disp: , Rfl:  Social History   Socioeconomic History   Marital status: Married    Spouse name: Jeneen Rinks   Number of children: 5   Years of education: 12   Highest education level: High school graduate  Occupational History    Employer: retired  Tobacco Use   Smoking status: Never    Passive exposure: Current (husband smokes 50+ years)    Smokeless tobacco: Never  Vaping Use   Vaping Use: Never used  Substance and Sexual Activity   Alcohol use: No   Drug use: No   Sexual activity: Not Currently  Other Topics Concern   Not on file  Social History Narrative   Lives with husband. Has 3 daughters and 2 step-daughters - all live within an hour away   Social Determinants of Health   Financial Resource Strain: Not on file  Food Insecurity: Not on file  Transportation Needs: Not on file  Physical Activity: Not on file  Stress: Not on file  Social Connections: Not on file  Intimate Partner Violence: Not on file   Family History  Problem Relation Age of Onset   Arthritis Mother    Heart attack Father        66s MI   Heart disease Father    Allergic rhinitis Sister    Asthma Sister    Allergic rhinitis Sister    Allergic rhinitis Brother    Heart disease Brother    Cancer Brother        liver   Asthma Maternal Grandmother    Heart attack Maternal Grandmother    Breast cancer Maternal Grandmother    Healthy Daughter    Healthy Daughter    Healthy Daughter    Healthy Daughter    Healthy Daughter    Food Allergy Niece    Thyroid cancer Neg Hx    Colon cancer Neg Hx    Prostate cancer Neg Hx    Ovarian cancer Neg Hx    Angioedema Neg Hx    Eczema Neg Hx    Atopy Neg Hx    Immunodeficiency Neg Hx     Objective: Office vital signs reviewed. BP 115/72   Pulse 64   Temp (!) 97.4 F (36.3 C)   Ht '5\' 2"'$  (1.575 m)   Wt 154 lb 3.2 oz (69.9 kg)   SpO2 97%   BMI 28.20 kg/m   Physical Examination:  General: Awake, alert, nontoxic-appearing female, No acute distress HEENT: Sclera white.  Moist mucous membranes Cardio: regular rate and rhythm, S1S2 heard, no murmurs appreciated Pulm: clear to auscultation bilaterally, no wheezes, rhonchi or rales; normal work of breathing on room air GI: soft, epigastric, left lower quadrant and left upper quadrant abdominal pain present.  No guarding.  No rebound.   Non-distended, bowel sounds present x4, no hepatomegaly, no splenomegaly, no masses MSK: She has tenderness to palpation just underneath the left lower ribs  Assessment/ Plan: 71 y.o. female   Left-sided chest wall pain - Plan: CT Chest Wo Contrast  Shortness of breath - Plan: CT Chest Wo Contrast  Left upper quadrant abdominal pain - Plan: CT Abdomen Pelvis Wo Contrast  Epigastric pain determined by examination - Plan: CT Abdomen Pelvis Wo  Contrast  History of colonic diverticulitis - Plan: CT Abdomen Pelvis Wo Contrast  Hypokalemia - Plan: potassium chloride SA (KLOR-CON M) 20 MEQ tablet  Pure hypercholesterolemia - Plan: rosuvastatin (CRESTOR) 10 MG tablet, Lipid panel   Uncertain etiology of her symptoms.  I am going to evaluate the shortness of breath and atypical chest pain with a CT chest without contrast.  I have also encouraged her to follow-up for regular follow-up with cardiology.  I worry about her very long exposure to secondhand smoke.  This certainly increases her risks of pathology in the lungs.  Continue follow-up with allergy and asthma as directed  CT abdomen pelvis also ordered as she continues to have a left-sided abdominal pain and given her history of diverticular abscess would like to make sure there is no complications going on in the abdomen.  Labs have been collected and refills have been sent to pharmacy today No orders of the defined types were placed in this encounter.  No orders of the defined types were placed in this encounter.    [name], DO [place] [phone]

## 2021-06-23 LAB — LIPID PANEL
Chol/HDL Ratio: 2.2 ratio (ref 0.0–4.4)
Cholesterol, Total: 98 mg/dL — ABNORMAL LOW (ref 100–199)
HDL: 45 mg/dL (ref >39)
LDL Chol Calc (NIH): 40 mg/dL (ref 0–99)
Triglycerides: 55 mg/dL (ref 0–149)
VLDL Cholesterol Cal: 13 mg/dL (ref 5–40)

## 2021-06-23 NOTE — Progress Notes (Signed)
Patient returning call. Please call back

## 2021-06-28 ENCOUNTER — Ambulatory Visit (INDEPENDENT_AMBULATORY_CARE_PROVIDER_SITE_OTHER): Payer: Medicare Other

## 2021-06-28 ENCOUNTER — Ambulatory Visit (HOSPITAL_BASED_OUTPATIENT_CLINIC_OR_DEPARTMENT_OTHER)
Admission: RE | Admit: 2021-06-28 | Discharge: 2021-06-28 | Disposition: A | Discharge status: Home / Self Care | Payer: Medicare Other | Source: Ambulatory Visit | Attending: Family Medicine | Admitting: Family Medicine

## 2021-06-28 ENCOUNTER — Encounter (HOSPITAL_BASED_OUTPATIENT_CLINIC_OR_DEPARTMENT_OTHER): Payer: Self-pay

## 2021-06-28 ENCOUNTER — Telehealth: Payer: Self-pay

## 2021-06-28 VITALS — Ht 62.0 in | Wt 154.0 lb

## 2021-06-28 DIAGNOSIS — Z0000 Encounter for general adult medical examination without abnormal findings: Secondary | ICD-10-CM

## 2021-06-28 DIAGNOSIS — R1013 Epigastric pain: Secondary | ICD-10-CM | POA: Diagnosis not present

## 2021-06-28 DIAGNOSIS — R1012 Left upper quadrant pain: Secondary | ICD-10-CM | POA: Insufficient documentation

## 2021-06-28 DIAGNOSIS — Z8719 Personal history of other diseases of the digestive system: Secondary | ICD-10-CM | POA: Diagnosis not present

## 2021-06-28 DIAGNOSIS — K5730 Diverticulosis of large intestine without perforation or abscess without bleeding: Secondary | ICD-10-CM | POA: Diagnosis not present

## 2021-06-28 DIAGNOSIS — R079 Chest pain, unspecified: Secondary | ICD-10-CM | POA: Diagnosis not present

## 2021-06-28 NOTE — Telephone Encounter (Signed)
Pt states she forgot to mention, at her last visit, that she is having issues with leg cramps x 4 months, usually at night time. Pt states she is staying hydrated and that she mainly drinks water. Pt asks what should she do?  Pt had recent blood work on 06/15/21 but the results are not available to me. Thank you.

## 2021-06-28 NOTE — Telephone Encounter (Signed)
Add on sheet filled out and taken to lab. Will see if they can add on

## 2021-06-28 NOTE — Progress Notes (Signed)
Subjective:   Diane Grant is a 71 y.o. female who presents for Medicare Annual (Subsequent) preventive examination. Virtual Visit via Telephone Note  I connected with  [name] on 06/28/21 at  8:15 AM EDT by telephone and verified that I am speaking with the correct person using two identifiers.  Location: Patient: HOME Provider: [facility] Persons participating in the virtual visit: patient/Nurse Health Advisor   I discussed the limitations, risks, security and privacy concerns of performing an evaluation and management service by telephone and the availability of in person appointments. The patient expressed understanding and agreed to proceed.  Interactive audio and video telecommunications were attempted between this nurse and patient, however failed, due to patient having technical difficulties OR patient did not have access to video capability.  We continued and completed visit with audio only.  Some vital signs may be absent or patient reported.   Chriss Driver, LPN  Review of Systems     Cardiac Risk Factors include: advanced age (>20mn, >>87women);hypertension;sedentary lifestyle;Other (see comment), Risk factor comments: Seasonal allergies     Objective:    Today's Vitals   06/28/21 0816 06/28/21 0818  Weight: 154 lb (69.9 kg)   Height: '5\' 2"'$  (1.575 m)   PainSc:  3    Body mass index is 28.17 kg/m.     06/28/2021    8:23 AM 06/03/2020   10:02 AM 06/03/2019    9:57 AM 09/09/2018    5:11 PM 06/02/2018    9:18 AM  Advanced Directives  Does Patient Have a Medical Advance Directive? No No No No No  Would patient like information on creating a medical advance directive? No - Patient declined Yes (MAU/Ambulatory/Procedural Areas - Information given) Yes (MAU/Ambulatory/Procedural Areas - Information given) Yes (ED - Information included in AVS) Yes (MAU/Ambulatory/Procedural Areas - Information given)    Current Medications  (verified) Outpatient Encounter Medications as of 06/28/2021  Medication Sig   albuterol (VENTOLIN HFA) 108 (90 Base) MCG/ACT inhaler Inhale 2 puffs into the lungs every 4 (four) hours as needed for wheezing or shortness of breath (coughing fits).   Budeson-Glycopyrrol-Formoterol (BREZTRI AEROSPHERE) 160-9-4.8 MCG/ACT AERO Inhale into the lungs.   dicyclomine (BENTYL) 20 MG tablet Take 20 mg by mouth 3 (three) times daily.   fluticasone (FLONASE) 50 MCG/ACT nasal spray Place 2 sprays into both nostrils daily.   hydrOXYzine (ATARAX) 10 MG tablet Take 1 tablet (10 mg total) by mouth 3 (three) times daily as needed for itching.   levocetirizine (XYZAL) 5 MG tablet Take by mouth.   levothyroxine (EUTHYROX) 75 MCG tablet Take 1 tablet (75 mcg total) by mouth daily before breakfast.   losartan-hydrochlorothiazide (HYZAAR) 100-25 MG tablet Take 1 tablet by mouth daily.   meclizine (ANTIVERT) 25 MG tablet TAKE 1 TABLET BY MOUTH THREE TIMES DAILY AS NEEDED FOR DIZZINESS   meloxicam (MOBIC) 15 MG tablet Take 1 tablet (15 mg total) by mouth daily. (ONLY ONCE DAILY if needed for joint/ back pain)   methocarbamol (ROBAXIN) 500 MG tablet TAKE 1 TABLET (500 MG TOTAL) BY MOUTH 4 (FOUR) TIMES DAILY AS NEEDED FOR MUSCLE SPASMS.   montelukast (SINGULAIR) 10 MG tablet TAKE 1 TABLET BY MOUTH EVERY DAY AT BEDTIME FOR ALLERGY/ASTHMA   potassium chloride SA (KLOR-CON M) 20 MEQ tablet Take 1 tablet (20 mEq total) by mouth daily.   rosuvastatin (CRESTOR) 10 MG tablet Take 1 tablet (10 mg total) by mouth at bedtime. For cholesterol   Spacer/Aero-Holding Chambers (AEROCHAMBER  PLUS) inhaler Use as instructed   Wheat Dextrin (BENEFIBER PO) Take by mouth.   No facility-administered encounter medications on file as of 06/28/2021.    Allergies (verified) Anesthesia s-[id number] [propofol]   History: Past Medical History:  Diagnosis Date   Degenerative disc disease, lumbar    Hypertension    Thyroid disease    hypothyroidism    Past Surgical History:  Procedure Laterality Date   ABDOMINAL HYSTERECTOMY     ROTATOR CUFF REPAIR  11/30/2015   Right shoulder   Family History  Problem Relation Age of Onset   Arthritis Mother    Heart attack Father        76s MI   Heart disease Father    Allergic rhinitis Sister    Asthma Sister    Allergic rhinitis Sister    Allergic rhinitis Brother    Heart disease Brother    Cancer Brother        liver   Asthma Maternal Grandmother    Heart attack Maternal Grandmother    Breast cancer Maternal Grandmother    Healthy Daughter    Healthy Daughter    Healthy Daughter    Healthy Daughter    Healthy Daughter    Food Allergy Niece    Thyroid cancer Neg Hx    Colon cancer Neg Hx    Prostate cancer Neg Hx    Ovarian cancer Neg Hx    Angioedema Neg Hx    Eczema Neg Hx    Atopy Neg Hx    Immunodeficiency Neg Hx    Social History   Socioeconomic History   Marital status: Married    Spouse name: Jeneen Rinks   Number of children: 5   Years of education: 12   Highest education level: High school graduate  Occupational History    Employer: retired  Tobacco Use   Smoking status: Never    Passive exposure: Current (husband smokes 50+ years)   Smokeless tobacco: Never  Vaping Use   Vaping Use: Never used  Substance and Sexual Activity   Alcohol use: No   Drug use: No   Sexual activity: Not Currently  Other Topics Concern   Not on file  Social History Narrative   Lives with husband. Has 3 daughters and 2 step-daughters - all live within an hour away   Social Determinants of Health   Financial Resource Strain: Not on file  Food Insecurity: No Food Insecurity   Worried About Charity fundraiser in the Last Year: Never true   Arboriculturist in the Last Year: Never true  Transportation Needs: No Transportation Needs   Lack of Transportation (Medical): No   Lack of Transportation (Non-Medical): No  Physical Activity: Sufficiently Active   Days of Exercise per  Week: 5 days   Minutes of Exercise per Session: 30 min  Stress: No Stress Concern Present   Feeling of Stress : Not at all  Social Connections: Socially Integrated   Frequency of Communication with Friends and Family: More than three times a week   Frequency of Social Gatherings with Friends and Family: More than three times a week   Attends Religious Services: More than 4 times per year   Active Member of [employer] or Organizations: Yes   Attends Music therapist: More than 4 times per year   Marital Status: Married    Tobacco Counseling Counseling given: Not Answered   Clinical Intake:  Pre-visit preparation completed: Yes  Pain : 0-10 Pain  Score: 3  Pain Type: Acute pain Pain Location: Abdomen Pain Orientation: Left Pain Descriptors / Indicators: Aching, Dull Pain Onset: 1 to 4 weeks ago Pain Frequency: Intermittent     BMI - recorded: 28.17 Nutritional Status: BMI 25 -29 Overweight Nutritional Risks: None Diabetes: No  How often do you need to have someone help you when you read instructions, pamphlets, or other written materials from your doctor or pharmacy?: 1 - Never  Diabetic?NO  Interpreter Needed?: No  Information entered by :: mj Cattie Tineo, lpn   Activities of Daily Living    06/28/2021    8:25 AM  In your present state of health, do you have any difficulty performing the following activities:  Hearing? 0  Vision? 0  Difficulty concentrating or making decisions? 0  Walking or climbing stairs? 0  Dressing or bathing? 0  Doing errands, shopping? 0  Preparing Food and eating ? N  Using the Toilet? N  In the past six months, have you accidently leaked urine? Y  Comment stress incontinence.  Do you have problems with loss of bowel control? N  Managing your Medications? N  Managing your Finances? N  Housekeeping or managing your Housekeeping? N    Patient Care Team: [name], DO as PCP - General (Family Medicine) Minus Breeding, MD as PCP - Cardiology (Cardiology) Gala Romney Cristopher Estimable, MD as Consulting Physician (Gastroenterology) Pleasant, Zettie Cooley, PA as Consulting Physician (Gastroenterology) Marshell Garfinkel, MD as Consulting Physician (Pulmonary Disease)  Indicate any recent Medical Services you may have received from other than [facility] providers in the past year (date may be approximate).     Assessment:   This is a routine wellness examination for Betty.  Hearing/Vision screen Hearing Screening - Comments:: Hearing issues to L ear.  Vision Screening - Comments:: Glasses. [place]. 2020.  Dietary issues and exercise activities discussed: Current Exercise Habits: Home exercise routine, Type of exercise: walking, Time (Minutes): 30, Frequency (Times/Week): 5, Weekly Exercise (Minutes/Week): 150, Intensity: Mild, Exercise limited by: cardiac condition(s);orthopedic condition(s)   Goals Addressed             This Visit's Progress    Exercise 3x per week (30 min per time)   On track    06/03/2019 AWV Goal: Exercise for General Health  Patient will verbalize understanding of the benefits of increased physical activity: Exercising regularly is important. It will improve your overall fitness, flexibility, and endurance. Regular exercise also will improve your overall health. It can help you control your weight, reduce stress, and improve your bone density. Over the next year, patient will increase physical activity as tolerated with a goal of at least 150 minutes of moderate physical activity per week.  You can tell that you are exercising at a moderate intensity if your heart starts beating faster and you start breathing faster but can still hold a conversation. Moderate-intensity exercise ideas include: Walking 1 mile (1.6 km) in about 15 minutes Biking Hiking Golfing Dancing Water aerobics Patient will verbalize understanding of everyday activities that increase physical activity by  providing examples like the following: Yard work, such as: Sales promotion account executive Gardening Washing windows or floors Patient will be able to explain general safety guidelines for exercising:  Before you start a new exercise program, talk with your health care provider. Do not exercise so much that you hurt yourself, feel dizzy, or get very short of breath. Wear comfortable  clothes and wear shoes with good support. Drink plenty of water while you exercise to prevent dehydration or heat stroke. Work out until your breathing and your heartbeat get faster.      Have 3 meals a day   On track    06/03/2019 AWV Goal: Improved Nutrition/Diet  Patient will verbalize understanding that diet plays an important role in overall health and that a poor diet is a risk factor for many chronic medical conditions.  Over the next year, patient will improve self management of their diet by incorporating improved meal pattern, more consistent meal timing, better food choices, and eat 6 small meals per day. Patient will utilize available community resources to help with food acquisition if needed (ex: food pantries, [address], etc) Patient will work with nutrition specialist if a referral was made      Patient Stated   On track    She would like to lose weight and get more active Goals Addressed             This Visit's Progress    awv goals   On track    06/02/2018 AWV Goal: Keep All Scheduled Appointments  Over the next year, patient will attend all scheduled appointments with their PCP and any specialists that they see.  06/02/2018 AWV Goal: Exercise for General Health  Patient will verbalize understanding of the benefits of increased physical activity: Exercising regularly is important. It will improve your overall fitness, flexibility, and endurance. Regular exercise also will improve your overall health. It can help  you control your weight, reduce stress, and improve your bone density. Over the next year, patient will increase physical activity as tolerated with a goal of at least 150 minutes of moderate physical activity per week.  You can tell that you are exercising at a moderate intensity if your heart starts beating faster and you start breathing faster but can still hold a conversation. Moderate-intensity exercise ideas include: Walking 1 mile (1.6 km) in about 15 minutes Biking Hiking Golfing Dancing Water aerobics Patient will verbalize understanding of everyday activities that increase physical activity by providing examples like the following: Yard work, such as: Sales promotion account executive Gardening Washing windows or floors Patient will be able to explain general safety guidelines for exercising:  Before you start a new exercise program, talk with your health care provider. Do not exercise so much that you hurt yourself, feel dizzy, or get very short of breath. Wear comfortable clothes and wear shoes with good support. Drink plenty of water while you exercise to prevent dehydration or heat stroke. Work out until your breathing and your heartbeat get faster.      Have 3 meals a day   On track    06/03/2019 AWV Goal: Improved Nutrition/Diet  Patient will verbalize understanding that diet plays an important role in overall health and that a poor diet is a risk factor for many chronic medical conditions.  Over the next year, patient will improve self management of their diet by incorporating improved meal pattern, more consistent meal timing, better food choices, and eat 6 small meals per day. Patient will utilize available community resources to help with food acquisition if needed (ex: food pantries, [address], etc) Patient will work with nutrition specialist if a referral was made      Patient Stated       She  would like to lose weight and  get more active             Depression Screen    06/28/2021    8:20 AM 06/17/2020    8:18 AM 06/03/2020    9:57 AM 03/29/2020    4:17 PM 06/23/2019    4:15 PM 06/03/2019    9:58 AM 06/02/2018    9:18 AM  PHQ 2/9 Scores  PHQ - 2 Score 0 0 0 0 0 0 0  PHQ- 9 Score     0      Fall Risk    06/28/2021    8:24 AM 06/17/2020    8:18 AM 06/03/2020   10:08 AM 03/29/2020    4:16 PM 06/23/2019    4:15 PM  Fall Risk   Falls in the past year? 0 0 0 0 0  Number falls in past yr: 0  0    Injury with Fall? 0  0    Risk for fall due to : No Fall Risks  No Fall Risks    Follow up Falls prevention discussed  Falls prevention discussed      FALL RISK PREVENTION PERTAINING TO THE HOME:  Any stairs in or around the home? Yes  If so, are there any without handrails? No  Home free of loose throw rugs in walkways, pet beds, electrical cords, etc? Yes  Adequate lighting in your home to reduce risk of falls? Yes   ASSISTIVE DEVICES UTILIZED TO PREVENT FALLS:  Life alert? No  Use of a cane, walker or w/c? No  Grab bars in the bathroom? No  Shower chair or bench in shower? No  Elevated toilet seat or a handicapped toilet? No   TIMED UP AND GO:  Was the test performed? No .    Cognitive Function:        06/28/2021    8:26 AM 06/03/2019   10:08 AM 06/02/2018    9:23 AM  6CIT Screen  What Year? 0 points 0 points 0 points  What month? 0 points 0 points 0 points  What time? 0 points 0 points 0 points  Count back from 20 0 points 0 points 0 points  Months in reverse 0 points 0 points 4 points  Repeat phrase 0 points 0 points 0 points  Total Score 0 points 0 points 4 points    Immunizations Immunization History  Administered Date(s) Administered   Influenza, High Dose Seasonal PF 11/15/2015, 02/24/2017, 12/04/2017   Influenza,inj,Quad PF,6+ Mos 02/19/2017   Influenza-Unspecified 01/06/2020, 11/18/2020   PFIZER(Purple Top)SARS-COV-2 Vaccination 02/24/2019,  03/17/2019, 11/29/2019   Pneumococcal Conjugate-13 06/23/2019   Pneumococcal Polysaccharide-23 03/28/2016   Tdap 06/15/2015   Zoster Recombinat (Shingrix) 05/31/2021    TDAP status: Up to date  Flu Vaccine status: Up to date  Pneumococcal vaccine status: Up to date  Covid-19 vaccine status: Completed vaccines  Qualifies for Shingles Vaccine? Yes   Zostavax completed Yes   Shingrix Completed?: No.    Education has been provided regarding the importance of this vaccine. Patient has been advised to call insurance company to determine out of pocket expense if they have not yet received this vaccine. Advised may also receive vaccine at local pharmacy or Health Dept. Verbalized acceptance and understanding.  Screening Tests Health Maintenance  Topic Date Due   COVID-19 Vaccine (4 - Booster for Pfizer series) 07/08/2021 (Originally 01/24/2020)   Zoster Vaccines- Shingrix (2 of 2) 07/26/2021   INFLUENZA VACCINE  09/05/2021   MAMMOGRAM  02/15/2022   COLONOSCOPY (Pts  45-60yr Insurance coverage will need to be confirmed)  11/26/2023   TETANUS/TDAP  06/14/2025   DEXA SCAN  06/21/2025   Pneumonia Vaccine 71 Years old  Completed   Hepatitis C Screening  Completed   HPV VACCINES  Aged Out    Health Maintenance  There are no preventive care reminders to display for this patient.  Colorectal cancer screening: Type of screening: Colonoscopy. Completed 11/26/2018. Repeat every 5 years  Mammogram status: Completed 02/15/2021. Repeat every year  Bone Density status: Completed 06/21/2020. Results reflect: Bone density results: NORMAL. Repeat every 5 years.  Lung Cancer Screening: (Low Dose CT Chest recommended if Age 71-80years, 30 pack-year currently smoking OR have quit w/in 15years.) does not qualify.   Additional Screening:  Hepatitis C Screening: does qualify; Completed 06/13/2016  Vision Screening: Recommended annual ophthalmology exams for early detection of glaucoma and other  disorders of the eye. Is the patient up to date with their annual eye exam?  No  Who is the provider or what is the name of the office in which the patient attends annual eye exams? [facility]If pt is not established with a provider, would they like to be referred to a provider to establish care? No .   Dental Screening: Recommended annual dental exams for proper oral hygiene  Community Resource Referral / Chronic Care Management: CRR required this visit?  No   CCM required this visit?  No      Plan:     I have personally reviewed and noted the following in the patient's chart:   Medical and social history Use of alcohol, tobacco or illicit drugs  Current medications and supplements including opioid prescriptions.  Functional ability and status Nutritional status Physical activity Advanced directives List of other physicians Hospitalizations, surgeries, and ER visits in previous 12 months Vitals Screenings to include cognitive, depression, and falls Referrals and appointments  In addition, I have reviewed and discussed with patient certain preventive protocols, quality metrics, and best practice recommendations. A written personalized care plan for preventive services as well as general preventive health recommendations were provided to patient.     MChriss Driver LPN   [id number]/[id number]  Nurse Notes: Discussed 2nd Shingrix and when to obtain.

## 2021-06-28 NOTE — Patient Instructions (Signed)
Diane Grant , Thank you for taking time to come for your Medicare Wellness Visit. I appreciate your ongoing commitment to your health goals. Please review the following plan we discussed and let me know if I can assist you in the future.   Screening recommendations/referrals: Colonoscopy: Done 11/26/2018 Repeat in 5 years  Mammogram: Done 02/15/2021 Repeat annually  Bone Density: Done 06/21/2020 Repeat in 5 years  Recommended yearly ophthalmology/optometry visit for glaucoma screening and checkup Recommended yearly dental visit for hygiene and checkup  Vaccinations: Influenza vaccine: Done 11/18/2020 Repeat annually  Pneumococcal vaccine: Done 06/23/2019, 03/28/2016. Tdap vaccine: Done 06/15/2015 Repeat in 10 years  Shingles vaccine: Done 05/31/2021. Second dose due after 07/31/2021.   Covid-19:Done 11/29/19, 03/17/19 and 02/24/19  Advanced directives: Please bring a copy of your health care power of attorney and living will to the office to be added to your chart at your convenience.   Conditions/risks identified: Aim for 30 minutes of exercise or brisk walking, 6-8 glasses of water, and 5 servings of fruits and vegetables each day. KEEP UP THE GOOD WORK!!  Next appointment: Follow up in one year for your annual wellness visit 2024.   Preventive Care 63 Years and Older, Female Preventive care refers to lifestyle choices and visits with your health care provider that can promote health and wellness. What does preventive care include? A yearly physical exam. This is also called an annual well check. Dental exams once or twice a year. Routine eye exams. Ask your health care provider how often you should have your eyes checked. Personal lifestyle choices, including: Daily care of your teeth and gums. Regular physical activity. Eating a healthy diet. Avoiding tobacco and drug use. Limiting alcohol use. Practicing safe sex. Taking low-dose aspirin every day. Taking vitamin and mineral  supplements as recommended by your health care provider. What happens during an annual well check? The services and screenings done by your health care provider during your annual well check will depend on your age, overall health, lifestyle risk factors, and family history of disease. Counseling  Your health care provider may ask you questions about your: Alcohol use. Tobacco use. Drug use. Emotional well-being. Home and relationship well-being. Sexual activity. Eating habits. History of falls. Memory and ability to understand (cognition). Work and work Statistician. Reproductive health. Screening  You may have the following tests or measurements: Height, weight, and BMI. Blood pressure. Lipid and cholesterol levels. These may be checked every 5 years, or more frequently if you are over 63 years old. Skin check. Lung cancer screening. You may have this screening every year starting at age 76 if you have a 30-pack-year history of smoking and currently smoke or have quit within the past 15 years. Fecal occult blood test (FOBT) of the stool. You may have this test every year starting at age 39. Flexible sigmoidoscopy or colonoscopy. You may have a sigmoidoscopy every 5 years or a colonoscopy every 10 years starting at age 20. Hepatitis C blood test. Hepatitis B blood test. Sexually transmitted disease (STD) testing. Diabetes screening. This is done by checking your blood sugar (glucose) after you have not eaten for a while (fasting). You may have this done every 1-3 years. Bone density scan. This is done to screen for osteoporosis. You may have this done starting at age 90. Mammogram. This may be done every 1-2 years. Talk to your health care provider about how often you should have regular mammograms. Talk with your health care provider about your test results,  treatment options, and if necessary, the need for more tests. Vaccines  Your health care provider may recommend certain  vaccines, such as: Influenza vaccine. This is recommended every year. Tetanus, diphtheria, and acellular pertussis (Tdap, Td) vaccine. You may need a Td booster every 10 years. Zoster vaccine. You may need this after age 55. Pneumococcal 13-valent conjugate (PCV13) vaccine. One dose is recommended after age 2. Pneumococcal polysaccharide (PPSV23) vaccine. One dose is recommended after age 46. Talk to your health care provider about which screenings and vaccines you need and how often you need them. This information is not intended to replace advice given to you by your health care provider. Make sure you discuss any questions you have with your health care provider. Document Released: 02/18/2015 Document Revised: 10/12/2015 Document Reviewed: 11/23/2014 Elsevier Interactive Patient Education  2017 [place] Prevention in the Home Falls can cause injuries. They can happen to people of all ages. There are many things you can do to make your home safe and to help prevent falls. What can I do on the outside of my home? Regularly fix the edges of walkways and driveways and fix any cracks. Remove anything that might make you trip as you walk through a door, such as a raised step or threshold. Trim any bushes or trees on the path to your home. Use bright outdoor lighting. Clear any walking paths of anything that might make someone trip, such as rocks or tools. Regularly check to see if handrails are loose or broken. Make sure that both sides of any steps have handrails. Any raised decks and porches should have guardrails on the edges. Have any leaves, snow, or ice cleared regularly. Use sand or salt on walking paths during winter. Clean up any spills in your garage right away. This includes oil or grease spills. What can I do in the bathroom? Use night lights. Install grab bars by the toilet and in the tub and shower. Do not use towel bars as grab bars. Use non-skid mats or decals in  the tub or shower. If you need to sit down in the shower, use a plastic, non-slip stool. Keep the floor dry. Clean up any water that spills on the floor as soon as it happens. Remove soap buildup in the tub or shower regularly. Attach bath mats securely with double-sided non-slip rug tape. Do not have throw rugs and other things on the floor that can make you trip. What can I do in the bedroom? Use night lights. Make sure that you have a light by your bed that is easy to reach. Do not use any sheets or blankets that are too big for your bed. They should not hang down onto the floor. Have a firm chair that has side arms. You can use this for support while you get dressed. Do not have throw rugs and other things on the floor that can make you trip. What can I do in the kitchen? Clean up any spills right away. Avoid walking on wet floors. Keep items that you use a lot in easy-to-reach places. If you need to reach something above you, use a strong step stool that has a grab bar. Keep electrical cords out of the way. Do not use floor polish or wax that makes floors slippery. If you must use wax, use non-skid floor wax. Do not have throw rugs and other things on the floor that can make you trip. What can I do with my stairs? Do  not leave any items on the stairs. Make sure that there are handrails on both sides of the stairs and use them. Fix handrails that are broken or loose. Make sure that handrails are as long as the stairways. Check any carpeting to make sure that it is firmly attached to the stairs. Fix any carpet that is loose or worn. Avoid having throw rugs at the top or bottom of the stairs. If you do have throw rugs, attach them to the floor with carpet tape. Make sure that you have a light switch at the top of the stairs and the bottom of the stairs. If you do not have them, ask someone to add them for you. What else can I do to help prevent falls? Wear shoes that: Do not have high  heels. Have rubber bottoms. Are comfortable and fit you well. Are closed at the toe. Do not wear sandals. If you use a stepladder: Make sure that it is fully opened. Do not climb a closed stepladder. Make sure that both sides of the stepladder are locked into place. Ask someone to hold it for you, if possible. Clearly mark and make sure that you can see: Any grab bars or handrails. First and last steps. Where the edge of each step is. Use tools that help you move around (mobility aids) if they are needed. These include: Canes. Walkers. Scooters. Crutches. Turn on the lights when you go into a dark area. Replace any light bulbs as soon as they burn out. Set up your furniture so you have a clear path. Avoid moving your furniture around. If any of your floors are uneven, fix them. If there are any pets around you, be aware of where they are. Review your medicines with your doctor. Some medicines can make you feel dizzy. This can increase your chance of falling. Ask your doctor what other things that you can do to help prevent falls. This information is not intended to replace advice given to you by your health care provider. Make sure you discuss any questions you have with your health care provider. Document Released: 11/18/2008 Document Revised: 06/30/2015 Document Reviewed: 02/26/2014 Elsevier Interactive Patient Education  2017 [employer].

## 2021-06-28 NOTE — Telephone Encounter (Signed)
The only thing that we collected was a lipid panel because this was not mentioned.  I am glad to try and add on magnesium and BMP if this can be done.  Please check with the lab

## 2021-06-29 ENCOUNTER — Encounter: Payer: Self-pay | Admitting: Allergy

## 2021-06-29 DIAGNOSIS — J309 Allergic rhinitis, unspecified: Secondary | ICD-10-CM

## 2021-06-29 LAB — COMPREHENSIVE METABOLIC PANEL
ALT: 49 IU/L — ABNORMAL HIGH (ref 0–32)
AST: 51 IU/L — ABNORMAL HIGH (ref 0–40)
Albumin/Globulin Ratio: 1.8 (ref 1.2–2.2)
Albumin: 4.7 g/dL (ref 3.7–4.7)
Alkaline Phosphatase: 77 IU/L (ref 44–121)
BUN/Creatinine Ratio: 16 (ref 12–28)
BUN: 12 mg/dL (ref 8–27)
Bilirubin Total: 0.5 mg/dL (ref 0.0–1.2)
CO2: 25 mmol/L (ref 20–29)
Calcium: 10.2 mg/dL (ref 8.7–10.3)
Chloride: 102 mmol/L (ref 96–106)
Creatinine, Ser: 0.74 mg/dL (ref 0.57–1.00)
Globulin, Total: 2.6 g/dL (ref 1.5–4.5)
Glucose: 80 mg/dL (ref 70–99)
Potassium: 4.4 mmol/L (ref 3.5–5.2)
Sodium: 143 mmol/L (ref 134–144)
Total Protein: 7.3 g/dL (ref 6.0–8.5)
eGFR: 86 mL/min/{1.73_m2} (ref >59)

## 2021-06-29 LAB — PROTEIN ELECTROPHORESIS, SERUM
A/G Ratio: 1.4 (ref 0.7–1.7)
Albumin ELP: 4.2 g/dL (ref 2.9–4.4)
Alpha 1: 0.3 g/dL (ref 0.0–0.4)
Alpha 2: 0.7 g/dL (ref 0.4–1.0)
Beta: 1 g/dL (ref 0.7–1.3)
Gamma Globulin: 1.1 g/dL (ref 0.4–1.8)
Globulin, Total: 3.1 g/dL (ref 2.2–3.9)

## 2021-06-29 LAB — ALLERGENS W/TOTAL IGE AREA 2
Alternaria Alternata IgE: <0.1 kU/L
Aspergillus Fumigatus IgE: <0.1 kU/L
Bermuda Grass IgE: <0.1 kU/L
Cat Dander IgE: <0.1 kU/L
Cedar, Mountain IgE: 0.13 kU/L — AB
Cladosporium Herbarum IgE: <0.1 kU/L
Cockroach, German IgE: 0.11 kU/L — AB
Common Silver Birch IgE: <0.1 kU/L
Cottonwood IgE: 0.12 kU/L — AB
D Farinae IgE: 0.11 kU/L — AB
D Pteronyssinus IgE: <0.1 kU/L
Dog Dander IgE: 0.15 kU/L — AB
Elm, American IgE: <0.1 kU/L
Johnson Grass IgE: <0.1 kU/L
Maple/Box Elder IgE: <0.1 kU/L
Mouse Urine IgE: <0.1 kU/L
Oak, White IgE: <0.1 kU/L
Pecan, Hickory IgE: <0.1 kU/L
Penicillium Chrysogen IgE: <0.1 kU/L
Pigweed, Rough IgE: <0.1 kU/L
Ragweed, Short IgE: <0.1 kU/L
Sheep Sorrel IgE Qn: <0.1 kU/L
Timothy Grass IgE: <0.1 kU/L
White Mulberry IgE: <0.1 kU/L

## 2021-06-29 LAB — ANA W/REFLEX: Anti Nuclear Antibody (ANA): NEGATIVE

## 2021-06-29 LAB — FOOD ALLERGY PROFILE
Allergen Corn, IgE: <0.1 kU/L
Clam IgE: <0.1 kU/L
Codfish IgE: <0.1 kU/L
Egg White IgE: 0.22 kU/L — AB
Milk IgE: 0.2 kU/L — AB
Peanut IgE: <0.1 kU/L
Scallop IgE: <0.1 kU/L
Sesame Seed IgE: <0.1 kU/L
Shrimp IgE: <0.1 kU/L
Soybean IgE: <0.1 kU/L
Walnut IgE: <0.1 kU/L
Wheat IgE: 0.18 kU/L — AB

## 2021-06-29 LAB — ALPHA-1-ANTITRYPSIN: A-1 Antitrypsin: 164 mg/dL (ref 101–187)

## 2021-06-29 LAB — ALPHA-GAL PANEL
Allergen Lamb IgE: 0.9 kU/L — AB
Beef IgE: 1.31 kU/L — AB
IgE (Immunoglobulin E), Serum: 636 IU/mL — ABNORMAL HIGH (ref 6–495)
O215-IgE Alpha-Gal: 2.92 kU/L — AB
Pork IgE: 0.79 kU/L — AB

## 2021-06-29 LAB — CBC WITH DIFFERENTIAL/PLATELET
Basophils Absolute: 0 10*3/uL (ref 0.0–0.2)
Basos: 0 %
EOS (ABSOLUTE): 0.1 10*3/uL (ref 0.0–0.4)
Eos: 1 %
Hematocrit: 41 % (ref 34.0–46.6)
Hemoglobin: 13.9 g/dL (ref 11.1–15.9)
Immature Grans (Abs): 0 10*3/uL (ref 0.0–0.1)
Immature Granulocytes: 0 %
Lymphocytes Absolute: 1.7 10*3/uL (ref 0.7–3.1)
Lymphs: 23 %
MCH: 30.8 pg (ref 26.6–33.0)
MCHC: 33.9 g/dL (ref 31.5–35.7)
MCV: 91 fL (ref 79–97)
Monocytes Absolute: 0.5 10*3/uL (ref 0.1–0.9)
Monocytes: 6 %
Neutrophils Absolute: 5.1 10*3/uL (ref 1.4–7.0)
Neutrophils: 70 %
Platelets: 270 10*3/uL (ref 150–450)
RBC: 4.52 x10E6/uL (ref 3.77–5.28)
RDW: 12.1 % (ref 11.7–15.4)
WBC: 7.4 10*3/uL (ref 3.4–10.8)

## 2021-06-29 LAB — THYROID CASCADE PROFILE: TSH: 0.543 u[IU]/mL (ref 0.450–4.500)

## 2021-06-29 LAB — PROTEIN ELECTROPHORESIS, URINE REFLEX
Albumin ELP, Urine: 24.6 %
Alpha-1-Globulin, U: 5.9 %
Alpha-2-Globulin, U: 20.1 %
Beta Globulin, U: 28.9 %
Gamma Globulin, U: 20.5 %
Protein, Ur: 23.2 mg/dL

## 2021-06-29 LAB — C-REACTIVE PROTEIN: CRP: <1 mg/L (ref 0–10)

## 2021-06-29 LAB — C3 AND C4
Complement C3, Serum: 139 mg/dL (ref 82–167)
Complement C4, Serum: 29 mg/dL (ref 12–38)

## 2021-06-29 LAB — CHRONIC URTICARIA: cu index: <2.2 (ref <10)

## 2021-06-29 LAB — TRYPTASE: Tryptase: 5.9 ug/L (ref 2.2–13.2)

## 2021-06-29 LAB — SEDIMENTATION RATE: Sed Rate: 18 mm/hr (ref 0–40)

## 2021-06-30 ENCOUNTER — Other Ambulatory Visit: Payer: Self-pay

## 2021-06-30 DIAGNOSIS — L509 Urticaria, unspecified: Secondary | ICD-10-CM

## 2021-06-30 LAB — SPECIMEN STATUS REPORT

## 2021-06-30 LAB — BASIC METABOLIC PANEL
BUN/Creatinine Ratio: 19 (ref 12–28)
BUN: 13 mg/dL (ref 8–27)
CO2: 18 mmol/L — ABNORMAL LOW (ref 20–29)
Calcium: 9.8 mg/dL (ref 8.7–10.3)
Chloride: 99 mmol/L (ref 96–106)
Creatinine, Ser: 0.7 mg/dL (ref 0.57–1.00)
Glucose: 82 mg/dL (ref 70–99)
Potassium: 4.1 mmol/L (ref 3.5–5.2)
Sodium: 140 mmol/L (ref 134–144)
eGFR: 92 mL/min/{1.73_m2} (ref >59)

## 2021-06-30 LAB — MAGNESIUM: Magnesium: 2.1 mg/dL (ref 1.6–2.3)

## 2021-06-30 NOTE — Progress Notes (Signed)
Per Dr. Maudie Mercury. Reordering a CMP lab test and mailing order to patient's home.

## 2021-07-01 ENCOUNTER — Other Ambulatory Visit: Payer: Self-pay | Admitting: Family Medicine

## 2021-07-06 ENCOUNTER — Ambulatory Visit: Payer: Medicare Other | Admitting: Allergy

## 2021-07-19 ENCOUNTER — Telehealth: Payer: Self-pay | Admitting: Cardiology

## 2021-07-19 ENCOUNTER — Other Ambulatory Visit: Payer: Self-pay

## 2021-07-19 ENCOUNTER — Encounter: Payer: Self-pay | Admitting: Family Medicine

## 2021-07-19 DIAGNOSIS — R0789 Other chest pain: Secondary | ICD-10-CM

## 2021-07-19 NOTE — Telephone Encounter (Signed)
Patient is requesting to switch providers from Dr. Percival Spanish to Dr. Harrell Gave. Please advise.

## 2021-07-20 ENCOUNTER — Encounter: Payer: Self-pay | Admitting: Cardiology

## 2021-07-21 ENCOUNTER — Other Ambulatory Visit: Payer: Medicare Other

## 2021-07-21 ENCOUNTER — Other Ambulatory Visit: Payer: Self-pay

## 2021-07-21 DIAGNOSIS — E7800 Pure hypercholesterolemia, unspecified: Secondary | ICD-10-CM | POA: Diagnosis not present

## 2021-07-21 DIAGNOSIS — J309 Allergic rhinitis, unspecified: Secondary | ICD-10-CM | POA: Diagnosis not present

## 2021-07-21 NOTE — Telephone Encounter (Signed)
OK by me 

## 2021-07-22 LAB — HEPATIC FUNCTION PANEL
ALT: 35 IU/L — ABNORMAL HIGH (ref 0–32)
AST: 48 IU/L — ABNORMAL HIGH (ref 0–40)
Albumin: 4.7 g/dL (ref 3.7–4.7)
Alkaline Phosphatase: 73 IU/L (ref 44–121)
Bilirubin Total: 0.5 mg/dL (ref 0.0–1.2)
Bilirubin, Direct: 0.15 mg/dL (ref 0.00–0.40)
Total Protein: 6.9 g/dL (ref 6.0–8.5)

## 2021-07-22 LAB — COMPREHENSIVE METABOLIC PANEL
ALT: 37 IU/L — ABNORMAL HIGH (ref 0–32)
AST: 49 IU/L — ABNORMAL HIGH (ref 0–40)
Albumin/Globulin Ratio: 2.3 — ABNORMAL HIGH (ref 1.2–2.2)
Albumin: 4.8 g/dL — ABNORMAL HIGH (ref 3.7–4.7)
Alkaline Phosphatase: 73 IU/L (ref 44–121)
BUN/Creatinine Ratio: 15 (ref 12–28)
BUN: 11 mg/dL (ref 8–27)
Bilirubin Total: 0.5 mg/dL (ref 0.0–1.2)
CO2: 26 mmol/L (ref 20–29)
Calcium: 10 mg/dL (ref 8.7–10.3)
Chloride: 101 mmol/L (ref 96–106)
Creatinine, Ser: 0.73 mg/dL (ref 0.57–1.00)
Globulin, Total: 2.1 g/dL (ref 1.5–4.5)
Glucose: 82 mg/dL (ref 70–99)
Potassium: 4.6 mmol/L (ref 3.5–5.2)
Sodium: 143 mmol/L (ref 134–144)
Total Protein: 6.9 g/dL (ref 6.0–8.5)
eGFR: 88 mL/min/{1.73_m2} (ref >59)

## 2021-07-25 ENCOUNTER — Ambulatory Visit (INDEPENDENT_AMBULATORY_CARE_PROVIDER_SITE_OTHER): Payer: Medicare Other | Admitting: Family Medicine

## 2021-07-25 ENCOUNTER — Encounter: Payer: Self-pay | Admitting: Family Medicine

## 2021-07-25 ENCOUNTER — Ambulatory Visit (INDEPENDENT_AMBULATORY_CARE_PROVIDER_SITE_OTHER): Payer: Medicare Other

## 2021-07-25 VITALS — BP 129/86 | HR 82 | Temp 97.2°F | Ht 62.0 in | Wt 152.0 lb

## 2021-07-25 DIAGNOSIS — R1310 Dysphagia, unspecified: Secondary | ICD-10-CM | POA: Diagnosis not present

## 2021-07-25 DIAGNOSIS — R252 Cramp and spasm: Secondary | ICD-10-CM

## 2021-07-25 DIAGNOSIS — M47812 Spondylosis without myelopathy or radiculopathy, cervical region: Secondary | ICD-10-CM | POA: Diagnosis not present

## 2021-07-25 DIAGNOSIS — M542 Cervicalgia: Secondary | ICD-10-CM | POA: Diagnosis not present

## 2021-07-25 NOTE — Progress Notes (Signed)
Subjective: CC: Hand cramping, neck pain PCP: [name], DO [id number] Diane Grant is a 71 y.o. female presenting to clinic today for:  1.  Hand cramping, neck pain Patient reports that she has been having some increasing cramping of bilateral hands.  She reports pressure pain in the neck and shoulders.  She has limited active range of motion.  She has known degenerative changes in the lumbar spine and is on Robaxin and Mobic for that.  She in fact reports some calf and toe cramping.  Over the last week or so she is actually had some difficulty swallowing.  She has previously required esophageal stretch by Dr. Cindee Salt PA-[facility] in [place] [facility].  This was performed in 2020.  Denies any nausea, vomiting.  She sometimes will have some generalized abdominal discomfort.  No blood in stool reported.  No unplanned weight loss reported.  She finds swallowing some pills difficult.  Sometimes food gets caught in her throat but she does not identify any specific foods.  She generally tries to stay away from red meat due to cholesterol issues.  Her last thyroid levels were obtained about 4 weeks ago and free T4 was noted to be elevated.  She had TSH repeated just a couple of weeks ago and that was normal   ROS: Per HPI  Allergies  Allergen Reactions   Anesthesia S-I-40 [Propofol] Nausea And Vomiting    Per patient need to be careful when giving this   Past Medical History:  Diagnosis Date   Degenerative disc disease, lumbar    Hypertension    Thyroid disease    hypothyroidism    Current Outpatient Medications:    albuterol (VENTOLIN HFA) 108 (90 Base) MCG/ACT inhaler, Inhale 2 puffs into the lungs every 4 (four) hours as needed for wheezing or shortness of breath (coughing fits)., Disp: 18 g, Rfl: 1   Budeson-Glycopyrrol-Formoterol (BREZTRI AEROSPHERE) 160-9-4.8 MCG/ACT AERO, Inhale into the lungs., Disp: , Rfl:    dicyclomine (BENTYL) 20 MG tablet, Take 20  mg by mouth 3 (three) times daily., Disp: , Rfl:    fluticasone (FLONASE) 50 MCG/ACT nasal spray, Place 2 sprays into both nostrils daily., Disp: 16 g, Rfl: 6   hydrOXYzine (ATARAX) 10 MG tablet, Take 1 tablet (10 mg total) by mouth 3 (three) times daily as needed for itching., Disp: 40 tablet, Rfl: 0   levocetirizine (XYZAL) 5 MG tablet, Take by mouth., Disp: , Rfl:    levothyroxine (EUTHYROX) 75 MCG tablet, Take 1 tablet (75 mcg total) by mouth daily before breakfast., Disp: 90 tablet, Rfl: 3   losartan-hydrochlorothiazide (HYZAAR) 100-25 MG tablet, Take 1 tablet by mouth daily., Disp: 90 tablet, Rfl: 3   meclizine (ANTIVERT) 25 MG tablet, TAKE 1 TABLET BY MOUTH THREE TIMES DAILY AS NEEDED FOR DIZZINESS, Disp: 30 tablet, Rfl: 0   meloxicam (MOBIC) 15 MG tablet, Take 1 tablet (15 mg total) by mouth daily. (ONLY ONCE DAILY if needed for joint/ back pain), Disp: 30 tablet, Rfl: 3   methocarbamol (ROBAXIN) 500 MG tablet, TAKE 1 TABLET (500 MG TOTAL) BY MOUTH 4 (FOUR) TIMES DAILY AS NEEDED FOR MUSCLE SPASMS., Disp: 40 tablet, Rfl: 0   montelukast (SINGULAIR) 10 MG tablet, TAKE 1 TABLET BY MOUTH EVERY DAY AT BEDTIME FOR ALLERGY/ASTHMA, Disp: 30 tablet, Rfl: 0   potassium chloride SA (KLOR-CON M) 20 MEQ tablet, Take 1 tablet (20 mEq total) by mouth daily., Disp: 90 tablet, Rfl: 3   rosuvastatin (CRESTOR) 10 MG  tablet, Take 1 tablet (10 mg total) by mouth at bedtime. For cholesterol, Disp: 90 tablet, Rfl: 3   Spacer/Aero-Holding Chambers (AEROCHAMBER PLUS) inhaler, Use as instructed, Disp: 1 each, Rfl: 2   Wheat Dextrin (BENEFIBER PO), Take by mouth., Disp: , Rfl:  Social History   Socioeconomic History   Marital status: Married    Spouse name: Jeneen Rinks   Number of children: 5   Years of education: 12   Highest education level: High school graduate  Occupational History    Employer: retired  Tobacco Use   Smoking status: Never    Passive exposure: Current (husband smokes 50+ years)   Smokeless  tobacco: Never  Vaping Use   Vaping Use: Never used  Substance and Sexual Activity   Alcohol use: No   Drug use: No   Sexual activity: Not Currently  Other Topics Concern   Not on file  Social History Narrative   Lives with husband. Has 3 daughters and 2 step-daughters - all live within an hour away   Social Determinants of Health   Financial Resource Strain: Low Risk  (06/03/2020)   Overall Financial Resource Strain (CARDIA)    Difficulty of Paying Living Expenses: Not hard at all  Food Insecurity: No Food Insecurity (06/28/2021)   Hunger Vital Sign    Worried About Running Out of Food in the Last Year: Never true    Ran Out of Food in the Last Year: Never true  Transportation Needs: No Transportation Needs (06/28/2021)   PRAPARE - Hydrologist (Medical): No    Lack of Transportation (Non-Medical): No  Physical Activity: Sufficiently Active (06/28/2021)   Exercise Vital Sign    Days of Exercise per Week: 5 days    Minutes of Exercise per Session: 30 min  Stress: No Stress Concern Present (06/28/2021)   Binger of Stress : Not at all  Social Connections: [place] (06/28/2021)   Social Connection and Isolation Panel [NHANES]    Frequency of Communication with Friends and Family: More than three times a week    Frequency of Social Gatherings with Friends and Family: More than three times a week    Attends Religious Services: More than 4 times per year    Active Member of [employer] or Organizations: Yes    Attends Music therapist: More than 4 times per year    Marital Status: Married  Human resources officer Violence: Not At Risk (06/28/2021)   Humiliation, Afraid, Rape, and Kick questionnaire    Fear of Current or Ex-Partner: No    Emotionally Abused: No    Physically Abused: No    Sexually Abused: No   Family History  Problem Relation Age of Onset    Arthritis Mother    Heart attack Father        41s MI   Heart disease Father    Allergic rhinitis Sister    Asthma Sister    Allergic rhinitis Sister    Allergic rhinitis Brother    Heart disease Brother    Cancer Brother        liver   Asthma Maternal Grandmother    Heart attack Maternal Grandmother    Breast cancer Maternal Grandmother    Healthy Daughter    Healthy Daughter    Healthy Daughter    Healthy Daughter    Healthy Daughter    Food Allergy Niece    Thyroid  cancer Neg Hx    Colon cancer Neg Hx    Prostate cancer Neg Hx    Ovarian cancer Neg Hx    Angioedema Neg Hx    Eczema Neg Hx    Atopy Neg Hx    Immunodeficiency Neg Hx     Objective: Office vital signs reviewed. BP 129/86   Pulse 82   Temp (!) 97.2 F (36.2 C)   Ht '5\' 2"'$  (1.575 m)   Wt 152 lb (68.9 kg)   SpO2 97%   BMI 27.80 kg/m   Physical Examination:  General: Awake, alert, well nourished, No acute distress HEENT: No exophthalmos.  No goiter.  No oropharyngeal erythema or or mucosal swelling. MSK: No gross joint deformity, soft tissue swelling, warmth or erythema.  She has 5/5 upper extremity strength in all planes. 5/5 lower extremity strength.  Light touch sensation grossly intact both upper extremity and lower extremities  C-spine: Has limited active range of motion in extension but has full active range of motion in flexion.  About a 15 degree loss and rotation to the left and right. Neuro: Cranial nerves II through XII grossly intact.  No focal neurologic deficits.  Specifically, symmetric rise of palate  No results found.  Assessment/ Plan: 71 y.o. female   Cramping of hands - Plan: DG Cervical Spine Complete  Cramping of feet  Dysphagia, unspecified type - Plan: DG Cervical Spine Complete  Neck pain - Plan: DG Cervical Spine Complete  I suspect that the symptoms she is experiencing in the hands and feet are likely secondary to underlying degenerative spinal issues.  I reviewed her  x-ray personally and this did show some degenerative changes within the spine.  Awaiting formal review by radiology.  May need to consider referral to orthopedics but I certainly think that she needs a repeat eval with GI for the dysphagia.  Uncertain etiology of that but since she has had history of narrowing I wonder if she is having recurrence of that.  Her physical exam was unremarkable from HEENT standpoint.  No orders of the defined types were placed in this encounter.  No orders of the defined types were placed in this encounter.    [name], DO [place] [phone]

## 2021-07-25 NOTE — Patient Instructions (Signed)
Spinal Stenosis  Spinal stenosis happens when the spinal canal gets smaller. The spinal canal is the space between the bones of your spine (vertebrae). As the canal gets smaller, the nerves that pass that part of the spine are pressed. This causes pain. Spinal stenosis can affect your neck, upper back, or lower back. What are the causes? This condition is caused by parts of bone that push into your spinal canal. This problem may start before birth. If it occurs after birth, the cause may be: Breakdown of bones of your spine. This normally starts around 71 years of age. Injury to your spine. Tumors in your spine. A buildup of calcium in your spine. What increases the risk? You are more likely to develop this condition if: You are older than age 52. You were born with a problem in your spine, such as a curved spine (scoliosis). You have arthritis. This is disease of your joints. What are the signs or symptoms? Common symptoms of this condition include: Pain in your neck or back. The pain may be worse when you stand or walk. Problems with your legs. A leg may lose feeling, tingle, or turn hot or cold. Pain that goes from your butt down to your lower leg (sciatica). Falling a lot. Slapping your foot down when you walk. This weakens muscles. Severe symptoms of this condition include: Problems pooping or peeing. Trouble having sex. Loss of feeling in your leg. Being unable to walk. Sometimes there are no symptoms. How is this treated? To treat pain and manage symptoms, you may be asked to: Practice sitting and standing up straight. This is good posture. Exercise. Lose weight, if needed. Take medicines or get shots. Support your back with a corset or a brace. In some cases, you may need to have surgery. Follow these instructions at home: Managing pain, stiffness, and swelling  Stand and sit up straight. If you have a brace or a corset, wear it as told. Keep a healthy weight. Talk with  your doctor if you need help losing weight. If told, put heat on the affected area. Do this as often as told by your doctor. Use the heat source that your doctor recommends, such as a moist heat pack or a heating pad. Place a towel between your skin and the heat source. Leave the heat on for 20-30 minutes. Take off the heat if your skin turns bright red. This is very important if you are unable to feel pain, heat, or cold. You have a greater risk of getting burned. Activity Do exercises as told by your doctor. Do not do activities that cause pain. Ask your doctor what activities are safe for you. Do not lift anything that is heavier than 10 lb (4.5 kg), or the limit that you are told by your doctor. Return to your normal activities as told by your doctor. Ask your doctor what activities are safe for you. General instructions Take over-the-counter and prescription medicines only as told by your doctor. Do not use any products that contain nicotine or tobacco, such as cigarettes, e-cigarettes, and chewing tobacco. If you need help quitting, ask your doctor. Eat a healthy diet. Eat a lot of: Fruits. Vegetables. Whole grains. Low-fat (lean) protein. Keep all follow-up visits as told by your doctor. This is important. Contact a doctor if: Your symptoms do not get better. Your symptoms get worse. You have a fever. Get help right away if: You have new pain or very bad pain in your neck  or upper back. You have very bad pain, and medicine does not help. You have a very bad headache. You are dizzy. You do not see well. You vomit or feel like you may vomit. You have these things in your back or legs: New or worse loss of feeling. New or worse tingling. Your arm or leg: Hurts or swells. Turns red. Feels warm. Summary Spinal stenosis happens when the space between the bones of the spine gets smaller. The small space puts pressure on the nerves in your spine. This condition may start before  birth. Breakdown of bones, injuries, or tumors can cause this condition after birth. Spinal stenosis can cause pain in the neck, back, legs, or butt. A leg may lose feeling, tingle, or turn hot or cold. Treatment for this condition focuses on lessening your pain and other symptoms. This information is not intended to replace advice given to you by your health care provider. Make sure you discuss any questions you have with your health care provider. Document Revised: 02/22/2021 Document Reviewed: 11/20/2018 Elsevier Patient Education  [place].

## 2021-07-27 ENCOUNTER — Ambulatory Visit (HOSPITAL_BASED_OUTPATIENT_CLINIC_OR_DEPARTMENT_OTHER): Payer: Medicare Other | Admitting: Cardiology

## 2021-07-27 ENCOUNTER — Other Ambulatory Visit: Payer: Self-pay | Admitting: Family Medicine

## 2021-07-27 ENCOUNTER — Encounter (HOSPITAL_BASED_OUTPATIENT_CLINIC_OR_DEPARTMENT_OTHER): Payer: Self-pay | Admitting: Cardiology

## 2021-07-27 VITALS — BP 136/78 | HR 58 | Ht 62.0 in | Wt 155.1 lb

## 2021-07-27 DIAGNOSIS — E7800 Pure hypercholesterolemia, unspecified: Secondary | ICD-10-CM

## 2021-07-27 DIAGNOSIS — I10 Essential (primary) hypertension: Secondary | ICD-10-CM

## 2021-07-27 DIAGNOSIS — Z7189 Other specified counseling: Secondary | ICD-10-CM | POA: Diagnosis not present

## 2021-07-27 DIAGNOSIS — Z7722 Contact with and (suspected) exposure to environmental tobacco smoke (acute) (chronic): Secondary | ICD-10-CM | POA: Diagnosis not present

## 2021-07-27 DIAGNOSIS — R0789 Other chest pain: Secondary | ICD-10-CM

## 2021-07-27 NOTE — Patient Instructions (Signed)

## 2021-07-27 NOTE — Progress Notes (Signed)
Cardiology Office Note:    Date:  07/27/2021   ID:  Diane Grant, DOB 03-Mar-1950, MRN [id number]  PCP:  [name], DO  Cardiologist:  Buford Dresser, MD  Referring MD: [name], DO   CC: establish care with me/prior patient of Dr. Percival Spanish  History of Present Illness:    Diane Grant is a 71 y.o. female with a hx of chest pain, hypertension who is seen as a new patient to me/prior patient of Dr. Percival Spanish.  Today: Here with her daughter today.  Continues to have mild pain on left side of chest, hurts even when she wears a bra. Can work outside for hours at a time before she has to come in and rest.   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  Had an episode when she was working in the yard, got severe cramps in her fingers, had to pull them apart. Gets cramps in her legs as well, has occurred over the last few months, mostly at night.  Cardiovascular risk factors: Prior clinical ASCVD: none Comorbid conditions: hypertension, has been on medications for years; hyperlipidemia, on rosuvastatin within the last six months; Denies diabetes, chronic kidney disease  Metabolic syndrome/Obesity: BMI 28 Chronic inflammatory conditions: arthritis, on meloxicam--likely degenerative and not rheumatoid Tobacco use history: none herself, passive exposure. Family history: father had MI, brothers had MI and CVA. Brother died of metastatic liver cancer. Mother had lung cancer, was a former smoker. Prior pertinent testing and/or incidental findings: ETT reviewed Exercise level: "[employer] [employer], walking, stays active Current diet: has worked to improved to improve diet, eats more fish and chicken, occasionally red meat. Eats greens/vegetables.  Denies shortness of breath at rest or with normal exertion. No PND, orthopnea, LE edema or unexpected weight gain. No syncope or palpitations.   Past Medical History:  Diagnosis  Date   Degenerative disc disease, lumbar    Hypertension    Thyroid disease    hypothyroidism    Past Surgical History:  Procedure Laterality Date   ABDOMINAL HYSTERECTOMY     ROTATOR CUFF REPAIR  11/30/2015   Right shoulder    Current Medications: Current Outpatient Medications on File Prior to Visit  Medication Sig   albuterol (VENTOLIN HFA) 108 (90 Base) MCG/ACT inhaler Inhale 2 puffs into the lungs every 4 (four) hours as needed for wheezing or shortness of breath (coughing fits).   Budeson-Glycopyrrol-Formoterol (BREZTRI AEROSPHERE) 160-9-4.8 MCG/ACT AERO Inhale into the lungs.   dicyclomine (BENTYL) 20 MG tablet Take 20 mg by mouth 3 (three) times daily.   fluticasone (FLONASE) 50 MCG/ACT nasal spray Place 2 sprays into both nostrils daily.   hydrOXYzine (ATARAX) 10 MG tablet Take 1 tablet (10 mg total) by mouth 3 (three) times daily as needed for itching.   levocetirizine (XYZAL) 5 MG tablet Take by mouth.   levothyroxine (EUTHYROX) 75 MCG tablet Take 1 tablet (75 mcg total) by mouth daily before breakfast.   losartan-hydrochlorothiazide (HYZAAR) 100-25 MG tablet Take 1 tablet by mouth daily.   meclizine (ANTIVERT) 25 MG tablet TAKE 1 TABLET BY MOUTH THREE TIMES DAILY AS NEEDED FOR DIZZINESS   meloxicam (MOBIC) 15 MG tablet Take 1 tablet (15 mg total) by mouth daily. (ONLY ONCE DAILY if needed for joint/ back pain)   methocarbamol (ROBAXIN) 500 MG tablet TAKE 1 TABLET (500 MG TOTAL) BY MOUTH 4 (FOUR) TIMES DAILY AS NEEDED FOR MUSCLE SPASMS.   montelukast (SINGULAIR) 10 MG tablet TAKE 1  TABLET BY MOUTH EVERY DAY AT BEDTIME FOR ALLERGY/ASTHMA   potassium chloride SA (KLOR-CON M) 20 MEQ tablet Take 1 tablet (20 mEq total) by mouth daily.   rosuvastatin (CRESTOR) 10 MG tablet Take 1 tablet (10 mg total) by mouth at bedtime. For cholesterol   Spacer/Aero-Holding Chambers (AEROCHAMBER PLUS) inhaler Use as instructed   Wheat Dextrin (BENEFIBER PO) Take by mouth.   No current  facility-administered medications on file prior to visit.     Allergies:   Anesthesia s-i-40 [propofol]   Social History   Tobacco Use   Smoking status: Never    Passive exposure: Current (husband smokes 50+ years)   Smokeless tobacco: Never  Vaping Use   Vaping Use: Never used  Substance Use Topics   Alcohol use: No   Drug use: No    Family History: family history includes Allergic rhinitis in her brother, sister, and sister; Arthritis in her mother; Asthma in her maternal grandmother and sister; Breast cancer in her maternal grandmother; Cancer in her brother; Food Allergy in her niece; Healthy in her daughter, daughter, daughter, daughter, and daughter; Heart attack in her father and maternal grandmother; Heart disease in her brother and father. There is no history of Thyroid cancer, Colon cancer, Prostate cancer, Ovarian cancer, Angioedema, Eczema, Atopy, or Immunodeficiency.  ROS:   Please see the history of present illness.  Additional pertinent ROS: Constitutional: Negative for chills, fever, night sweats, unintentional weight loss  HENT: Negative for ear pain and hearing loss.   Eyes: Negative for loss of vision and eye pain.  Respiratory: Negative for cough, sputum, wheezing.   Cardiovascular: See HPI. Gastrointestinal: Negative for abdominal pain, melena, and hematochezia. +constipation Genitourinary: Negative for dysuria and hematuria.  Musculoskeletal: Negative for falls and myalgias.  Skin: Negative for itching and rash.  Neurological: Negative for focal weakness, focal sensory changes and loss of consciousness.  Endo/Heme/Allergies: Does not bruise/bleed easily.     EKGs/Labs/Other Studies Reviewed:    The following studies were reviewed today: ETT 16-Jul-2020 Blood pressure demonstrated a normal response to exercise. Upsloping ST segment depression ST segment depression of 2 mm was noted during stress in the II, III, aVF, V5, V6 and V4 leads, and returning to  baseline after 1-5 minutes of recovery.   Abnormal ETT with 2 mm upsloping ST depression in inferior lateral leads  EKG:  EKG is personally reviewed.   07/27/21: Sinus bradycardia at 58 bpm  Recent Labs: 2021-07-16: Hemoglobin 13.9; Magnesium 2.1; Platelets 270; TSH 0.543 07/21/2021: ALT 37; BUN 11; Creatinine, Ser 0.73; Potassium 4.6; Sodium 143  Recent Lipid Panel    Component Value Date/Time   CHOL 98 (L) 16-Jul-2021 1007   TRIG 55 07-16-2021 1007   HDL 45 Jul 16, 2021 1007   CHOLHDL 2.2 July 16, 2021 1007   LDLCALC 40 Jul 16, 2021 1007    Physical Exam:    VS:  BP 136/78 (BP Location: Right Arm, Patient Position: Sitting, Cuff Size: Normal)   Pulse (!) 58   Ht '5\' 2"'$  (1.575 m)   Wt 155 lb 1.6 oz (70.4 kg)   BMI 28.37 kg/m     Wt Readings from Last 3 Encounters:  07/27/21 155 lb 1.6 oz (70.4 kg)  07/25/21 152 lb (68.9 kg)  06/28/21 154 lb (69.9 kg)    GEN: Well nourished, well developed in no acute distress HEENT: Normal, moist mucous membranes NECK: No JVD CARDIAC: regular rhythm, normal S1 and S2, no rubs or gallops. No murmur. Tender on palpation of chest wall. VASCULAR:  Radial and DP pulses 2+ bilaterally. No carotid bruits RESPIRATORY:  Clear to auscultation without rales, wheezing or rhonchi  ABDOMEN: Soft, non-tender, non-distended MUSCULOSKELETAL:  Ambulates independently SKIN: Warm and dry, no edema NEUROLOGIC:  Alert and oriented x 3. No focal neuro deficits noted. PSYCHIATRIC:  Normal affect    ASSESSMENT:    1. Atypical chest pain   2. Essential hypertension   3. Second hand tobacco smoke exposure   4. Pure hypercholesterolemia   5. Cardiac risk counseling   6. Counseling on health promotion and disease prevention    PLAN:    Atypical chest pain -ETT 06/22/20 was a negative adequate study -tender on palpation of chest wall -discussed MSK chest pain -also reviewed red flag warning signs that need immediate medical attention  Hypertension -typically  well controlled -continue losartan-HCTZ  Hypercholesterolemia -started on rosuvastatin 10 mg in the last few months -ALT/AST have been borderline elevated, stable -would continue statin for now, if LFTs go up significantly, would hold statin and recheck LFTs  Cardiac risk counseling and prevention recommendations: -recommend heart healthy/Mediterranean diet, with whole grains, fruits, vegetable, fish, lean meats, nuts, and olive oil. Limit salt. -recommend moderate walking, 3-5 times/week for 30-50 minutes each session. Aim for at least 150 minutes.week. Goal should be pace of 3 miles/hours, or walking 1.5 miles in 30 minutes -recommend avoidance of tobacco products. Avoid excess alcohol. -ASCVD risk score: The ASCVD Risk score (Arnett DK, et al., 2019) failed to calculate for the following reasons:   The valid total cholesterol range is 130 to 320 mg/dL    Plan for follow up: as needed  Buford Dresser, MD, PhD, [facility]    Medication Adjustments/Labs and Tests Ordered: Current medicines are reviewed at length with the patient today.  Concerns regarding medicines are outlined above.  Orders Placed This Encounter  Procedures   EKG 12-Lead   No orders of the defined types were placed in this encounter.   Patient Instructions  Medication Instructions:  Your Physician recommend you continue on your current medication as directed.    *If you need a refill on your cardiac medications before your next appointment, please call your pharmacy*   Lab Work: None ordered today   Testing/Procedures: None ordered today   Follow-Up: At [facility], you and your health needs are our priority.  As part of our continuing mission to provide you with exceptional heart care, we have created designated Provider Care Teams.  These Care Teams include your primary Cardiologist (physician) and Advanced Practice Providers (APPs -  Physician Assistants and Nurse  Practitioners) who all work together to provide you with the care you need, when you need it.  We recommend signing up for the patient portal called "MyChart".  Sign up information is provided on this After Visit Summary.  MyChart is used to connect with patients for Virtual Visits (Telemedicine).  Patients are able to view lab/test results, encounter notes, upcoming appointments, etc.  Non-urgent messages can be sent to your provider as well.   To learn more about what you can do with MyChart, go to [email or web address].    Your next appointment:   As needed  The format for your next appointment:   In Person  Provider:   Buford Dresser, MD         Signed, Buford Dresser, MD PhD 07/27/2021     [place]

## 2021-07-28 ENCOUNTER — Telehealth: Payer: Self-pay | Admitting: Family Medicine

## 2021-07-28 ENCOUNTER — Encounter: Payer: Self-pay | Admitting: Family Medicine

## 2021-07-28 ENCOUNTER — Other Ambulatory Visit: Payer: Self-pay | Admitting: Family Medicine

## 2021-07-28 DIAGNOSIS — I6523 Occlusion and stenosis of bilateral carotid arteries: Secondary | ICD-10-CM

## 2021-08-02 ENCOUNTER — Telehealth: Payer: Self-pay | Admitting: Family Medicine

## 2021-08-02 DIAGNOSIS — R7989 Other specified abnormal findings of blood chemistry: Secondary | ICD-10-CM

## 2021-08-02 NOTE — Telephone Encounter (Signed)
Pt aware when shot is due and lab order placed

## 2021-08-10 ENCOUNTER — Other Ambulatory Visit: Payer: Medicare Other

## 2021-08-10 ENCOUNTER — Ambulatory Visit (INDEPENDENT_AMBULATORY_CARE_PROVIDER_SITE_OTHER): Payer: Medicare Other | Admitting: Emergency Medicine

## 2021-08-10 DIAGNOSIS — R7989 Other specified abnormal findings of blood chemistry: Secondary | ICD-10-CM

## 2021-08-10 DIAGNOSIS — Z23 Encounter for immunization: Secondary | ICD-10-CM

## 2021-08-11 LAB — HEPATIC FUNCTION PANEL
ALT: 34 IU/L — ABNORMAL HIGH (ref 0–32)
AST: 47 IU/L — ABNORMAL HIGH (ref 0–40)
Albumin: 4.7 g/dL (ref 3.7–4.7)
Alkaline Phosphatase: 83 IU/L (ref 44–121)
Bilirubin Total: 0.5 mg/dL (ref 0.0–1.2)
Bilirubin, Direct: 0.15 mg/dL (ref 0.00–0.40)
Total Protein: 6.9 g/dL (ref 6.0–8.5)

## 2021-08-28 ENCOUNTER — Other Ambulatory Visit: Payer: Self-pay | Admitting: Family Medicine

## 2021-08-28 DIAGNOSIS — R42 Dizziness and giddiness: Secondary | ICD-10-CM

## 2021-08-29 MED ORDER — MECLIZINE HCL 25 MG PO TABS: ORAL_TABLET | ORAL | 0 refills | Status: DC

## 2021-08-29 NOTE — Addendum Note (Signed)
Addended by: Antonietta Barcelona D on: 08/29/2021 07:39 AM   Modules accepted: Orders

## 2021-08-29 NOTE — Telephone Encounter (Signed)
Refill failed. resent °

## 2021-08-31 DIAGNOSIS — R140 Abdominal distension (gaseous): Secondary | ICD-10-CM | POA: Diagnosis not present

## 2021-08-31 DIAGNOSIS — R1310 Dysphagia, unspecified: Secondary | ICD-10-CM | POA: Diagnosis not present

## 2021-08-31 DIAGNOSIS — K581 Irritable bowel syndrome with constipation: Secondary | ICD-10-CM | POA: Diagnosis not present

## 2021-08-31 DIAGNOSIS — R112 Nausea with vomiting, unspecified: Secondary | ICD-10-CM | POA: Diagnosis not present

## 2021-08-31 DIAGNOSIS — R1011 Right upper quadrant pain: Secondary | ICD-10-CM | POA: Diagnosis not present

## 2021-08-31 DIAGNOSIS — R1013 Epigastric pain: Secondary | ICD-10-CM | POA: Diagnosis not present

## 2021-08-31 DIAGNOSIS — Z86010 Personal history of colonic polyps: Secondary | ICD-10-CM | POA: Diagnosis not present

## 2021-08-31 DIAGNOSIS — K219 Gastro-esophageal reflux disease without esophagitis: Secondary | ICD-10-CM | POA: Diagnosis not present

## 2021-09-07 ENCOUNTER — Ambulatory Visit (HOSPITAL_COMMUNITY)
Admission: RE | Admit: 2021-09-07 | Discharge: 2021-09-07 | Disposition: A | Discharge status: Home / Self Care | Payer: Medicare Other | Source: Ambulatory Visit | Attending: Family Medicine | Admitting: Family Medicine

## 2021-09-07 DIAGNOSIS — I6523 Occlusion and stenosis of bilateral carotid arteries: Secondary | ICD-10-CM | POA: Diagnosis not present

## 2021-09-20 DIAGNOSIS — R1013 Epigastric pain: Secondary | ICD-10-CM | POA: Diagnosis not present

## 2021-09-20 DIAGNOSIS — K581 Irritable bowel syndrome with constipation: Secondary | ICD-10-CM | POA: Diagnosis not present

## 2021-09-20 DIAGNOSIS — R1310 Dysphagia, unspecified: Secondary | ICD-10-CM | POA: Diagnosis not present

## 2021-09-20 DIAGNOSIS — R112 Nausea with vomiting, unspecified: Secondary | ICD-10-CM | POA: Diagnosis not present

## 2021-09-20 DIAGNOSIS — K760 Fatty (change of) liver, not elsewhere classified: Secondary | ICD-10-CM | POA: Diagnosis not present

## 2021-09-20 DIAGNOSIS — Z86010 Personal history of colonic polyps: Secondary | ICD-10-CM | POA: Diagnosis not present

## 2021-09-20 DIAGNOSIS — R109 Unspecified abdominal pain: Secondary | ICD-10-CM | POA: Diagnosis not present

## 2021-10-25 DIAGNOSIS — E7800 Pure hypercholesterolemia, unspecified: Secondary | ICD-10-CM | POA: Diagnosis not present

## 2021-10-25 DIAGNOSIS — R1310 Dysphagia, unspecified: Secondary | ICD-10-CM | POA: Diagnosis not present

## 2021-10-25 DIAGNOSIS — K2950 Unspecified chronic gastritis without bleeding: Secondary | ICD-10-CM | POA: Diagnosis not present

## 2021-10-25 DIAGNOSIS — K219 Gastro-esophageal reflux disease without esophagitis: Secondary | ICD-10-CM | POA: Diagnosis not present

## 2021-11-29 DIAGNOSIS — I10 Essential (primary) hypertension: Secondary | ICD-10-CM | POA: Diagnosis not present

## 2021-11-29 DIAGNOSIS — E079 Disorder of thyroid, unspecified: Secondary | ICD-10-CM | POA: Diagnosis not present

## 2021-12-01 ENCOUNTER — Ambulatory Visit (INDEPENDENT_AMBULATORY_CARE_PROVIDER_SITE_OTHER): Payer: Medicare Other

## 2021-12-01 DIAGNOSIS — Z23 Encounter for immunization: Secondary | ICD-10-CM

## 2021-12-25 DIAGNOSIS — R140 Abdominal distension (gaseous): Secondary | ICD-10-CM | POA: Diagnosis not present

## 2021-12-25 DIAGNOSIS — R1310 Dysphagia, unspecified: Secondary | ICD-10-CM | POA: Diagnosis not present

## 2021-12-25 DIAGNOSIS — Z86010 Personal history of colonic polyps: Secondary | ICD-10-CM | POA: Diagnosis not present

## 2021-12-25 DIAGNOSIS — K581 Irritable bowel syndrome with constipation: Secondary | ICD-10-CM | POA: Diagnosis not present

## 2021-12-25 DIAGNOSIS — R1010 Upper abdominal pain, unspecified: Secondary | ICD-10-CM | POA: Diagnosis not present

## 2021-12-25 DIAGNOSIS — R112 Nausea with vomiting, unspecified: Secondary | ICD-10-CM | POA: Diagnosis not present

## 2021-12-25 DIAGNOSIS — K219 Gastro-esophageal reflux disease without esophagitis: Secondary | ICD-10-CM | POA: Diagnosis not present

## 2021-12-25 DIAGNOSIS — R1013 Epigastric pain: Secondary | ICD-10-CM | POA: Diagnosis not present

## 2022-02-27 ENCOUNTER — Other Ambulatory Visit: Payer: Self-pay | Admitting: Family Medicine

## 2022-02-27 DIAGNOSIS — Z1231 Encounter for screening mammogram for malignant neoplasm of breast: Secondary | ICD-10-CM

## 2022-03-05 ENCOUNTER — Ambulatory Visit
Admission: RE | Admit: 2022-03-05 | Discharge: 2022-03-05 | Disposition: A | Discharge status: Home / Self Care | Payer: Medicare Other | Source: Ambulatory Visit | Attending: Family Medicine | Admitting: Family Medicine

## 2022-03-05 DIAGNOSIS — Z1231 Encounter for screening mammogram for malignant neoplasm of breast: Secondary | ICD-10-CM

## 2022-03-16 DIAGNOSIS — S8991XA Unspecified injury of right lower leg, initial encounter: Secondary | ICD-10-CM | POA: Diagnosis not present

## 2022-03-16 DIAGNOSIS — W1842XA Slipping, tripping and stumbling without falling due to stepping into hole or opening, initial encounter: Secondary | ICD-10-CM | POA: Diagnosis not present

## 2022-03-23 ENCOUNTER — Encounter: Payer: Self-pay | Admitting: Family Medicine

## 2022-03-27 DIAGNOSIS — R1310 Dysphagia, unspecified: Secondary | ICD-10-CM | POA: Diagnosis not present

## 2022-03-27 DIAGNOSIS — R140 Abdominal distension (gaseous): Secondary | ICD-10-CM | POA: Diagnosis not present

## 2022-03-27 DIAGNOSIS — R1013 Epigastric pain: Secondary | ICD-10-CM | POA: Diagnosis not present

## 2022-03-27 DIAGNOSIS — Z86010 Personal history of colonic polyps: Secondary | ICD-10-CM | POA: Diagnosis not present

## 2022-03-27 DIAGNOSIS — K219 Gastro-esophageal reflux disease without esophagitis: Secondary | ICD-10-CM | POA: Diagnosis not present

## 2022-03-27 DIAGNOSIS — K581 Irritable bowel syndrome with constipation: Secondary | ICD-10-CM | POA: Diagnosis not present

## 2022-03-27 DIAGNOSIS — R109 Unspecified abdominal pain: Secondary | ICD-10-CM | POA: Diagnosis not present

## 2022-07-04 ENCOUNTER — Ambulatory Visit (INDEPENDENT_AMBULATORY_CARE_PROVIDER_SITE_OTHER): Payer: Medicare Other

## 2022-07-04 VITALS — Ht 62.0 in | Wt 144.0 lb

## 2022-07-04 DIAGNOSIS — Z0000 Encounter for general adult medical examination without abnormal findings: Secondary | ICD-10-CM | POA: Diagnosis not present

## 2022-07-04 NOTE — Patient Instructions (Signed)
Diane Grant , Thank you for taking time to come for your Medicare Wellness Visit. I appreciate your ongoing commitment to your health goals. Please review the following plan we discussed and let me know if I can assist you in the future.   These are the goals we discussed:  Goals      awv goals     06/02/2018 AWV Goal: Keep All Scheduled Appointments  Over the next year, patient will attend all scheduled appointments with their PCP and any specialists that they see.  06/02/2018 AWV Goal: Exercise for General Health  Patient will verbalize understanding of the benefits of increased physical activity: Exercising regularly is important. It will improve your overall fitness, flexibility, and endurance. Regular exercise also will improve your overall health. It can help you control your weight, reduce stress, and improve your bone density. Over the next year, patient will increase physical activity as tolerated with a goal of at least 150 minutes of moderate physical activity per week.  You can tell that you are exercising at a moderate intensity if your heart starts beating faster and you start breathing faster but can still hold a conversation. Moderate-intensity exercise ideas include: Walking 1 mile (1.6 km) in about 15 minutes Biking Hiking Golfing Dancing Water aerobics Patient will verbalize understanding of everyday activities that increase physical activity by providing examples like the following: Yard work, such as: Insurance underwriter Gardening Washing windows or floors Patient will be able to explain general safety guidelines for exercising:  Before you start a new exercise program, talk with your health care provider. Do not exercise so much that you hurt yourself, feel dizzy, or get very short of breath. Wear comfortable clothes and wear shoes with good support. Drink plenty of water while  you exercise to prevent dehydration or heat stroke. Work out until your breathing and your heartbeat get faster.      Exercise 3x per week (30 min per time)     06/03/2019 AWV Goal: Exercise for General Health  Patient will verbalize understanding of the benefits of increased physical activity: Exercising regularly is important. It will improve your overall fitness, flexibility, and endurance. Regular exercise also will improve your overall health. It can help you control your weight, reduce stress, and improve your bone density. Over the next year, patient will increase physical activity as tolerated with a goal of at least 150 minutes of moderate physical activity per week.  You can tell that you are exercising at a moderate intensity if your heart starts beating faster and you start breathing faster but can still hold a conversation. Moderate-intensity exercise ideas include: Walking 1 mile (1.6 km) in about 15 minutes Biking Hiking Golfing Dancing Water aerobics Patient will verbalize understanding of everyday activities that increase physical activity by providing examples like the following: Yard work, such as: Insurance underwriter Gardening Washing windows or floors Patient will be able to explain general safety guidelines for exercising:  Before you start a new exercise program, talk with your health care provider. Do not exercise so much that you hurt yourself, feel dizzy, or get very short of breath. Wear comfortable clothes and wear shoes with good support. Drink plenty of water while you exercise to prevent dehydration or heat stroke. Work out until your breathing and your heartbeat get faster.  Have 3 meals a day     06/03/2019 AWV Goal: Improved Nutrition/Diet  Patient will verbalize understanding that diet plays an important role in overall health and that a poor diet is a risk factor  for many chronic medical conditions.  Over the next year, patient will improve self management of their diet by incorporating improved meal pattern, more consistent meal timing, better food choices, and eat 6 small meals per day. Patient will utilize available community resources to help with food acquisition if needed (ex: food pantries, [address], etc) Patient will work with nutrition specialist if a referral was made      Patient Stated     She would like to lose weight and get more active Goals Addressed             This Visit's Progress    awv goals   On track    06/02/2018 AWV Goal: Keep All Scheduled Appointments  Over the next year, patient will attend all scheduled appointments with their PCP and any specialists that they see.  06/02/2018 AWV Goal: Exercise for General Health  Patient will verbalize understanding of the benefits of increased physical activity: Exercising regularly is important. It will improve your overall fitness, flexibility, and endurance. Regular exercise also will improve your overall health. It can help you control your weight, reduce stress, and improve your bone density. Over the next year, patient will increase physical activity as tolerated with a goal of at least 150 minutes of moderate physical activity per week.  You can tell that you are exercising at a moderate intensity if your heart starts beating faster and you start breathing faster but can still hold a conversation. Moderate-intensity exercise ideas include: Walking 1 mile (1.6 km) in about 15 minutes Biking Hiking Golfing Dancing Water aerobics Patient will verbalize understanding of everyday activities that increase physical activity by providing examples like the following: Yard work, such as: Insurance underwriter Gardening Washing windows or floors Patient will be able to explain general safety  guidelines for exercising:  Before you start a new exercise program, talk with your health care provider. Do not exercise so much that you hurt yourself, feel dizzy, or get very short of breath. Wear comfortable clothes and wear shoes with good support. Drink plenty of water while you exercise to prevent dehydration or heat stroke. Work out until your breathing and your heartbeat get faster.      Have 3 meals a day   On track    06/03/2019 AWV Goal: Improved Nutrition/Diet  Patient will verbalize understanding that diet plays an important role in overall health and that a poor diet is a risk factor for many chronic medical conditions.  Over the next year, patient will improve self management of their diet by incorporating improved meal pattern, more consistent meal timing, better food choices, and eat 6 small meals per day. Patient will utilize available community resources to help with food acquisition if needed (ex: food pantries, [address], etc) Patient will work with nutrition specialist if a referral was made      Patient Stated       She would like to lose weight and get more active              This is a list of the screening recommended for you and due dates:  Health Maintenance  Topic Date Due   Zoster (Shingles) Vaccine (  2 of 2) 07/26/2021   COVID-19 Vaccine (4 - 2023-24 season) 10/06/2021   Flu Shot  09/06/2022   Mammogram  03/06/2023   Medicare Annual Wellness Visit  07/04/2023   DTaP/Tdap/Td vaccine (2 - Td or Tdap) 06/14/2025   DEXA scan (bone density measurement)  06/21/2025   Colon Cancer Screening  03/28/2027   Pneumonia Vaccine  Completed   Hepatitis C Screening  Completed   HPV Vaccine  Aged Out    Advanced directives: Advance directive discussed with you today. I have provided a copy for you to complete at home and have notarized. Once this is complete please bring a copy in to our office so we can scan it into your chart.   Conditions/risks identified:  Aim for 30 minutes of exercise or brisk walking, 6-8 glasses of water, and 5 servings of fruits and vegetables each day.   Next appointment: Follow up in one year for your annual wellness visit    Preventive Care 65 Years and Older, Female Preventive care refers to lifestyle choices and visits with your health care provider that can promote health and wellness. What does preventive care include? A yearly physical exam. This is also called an annual well check. Dental exams once or twice a year. Routine eye exams. Ask your health care provider how often you should have your eyes checked. Personal lifestyle choices, including: Daily care of your teeth and gums. Regular physical activity. Eating a healthy diet. Avoiding tobacco and drug use. Limiting alcohol use. Practicing safe sex. Taking low-dose aspirin every day. Taking vitamin and mineral supplements as recommended by your health care provider. What happens during an annual well check? The services and screenings done by your health care provider during your annual well check will depend on your age, overall health, lifestyle risk factors, and family history of disease. Counseling  Your health care provider may ask you questions about your: Alcohol use. Tobacco use. Drug use. Emotional well-being. Home and relationship well-being. Sexual activity. Eating habits. History of falls. Memory and ability to understand (cognition). Work and work Astronomer. Reproductive health. Screening  You may have the following tests or measurements: Height, weight, and BMI. Blood pressure. Lipid and cholesterol levels. These may be checked every 5 years, or more frequently if you are over 64 years old. Skin check. Lung cancer screening. You may have this screening every year starting at age 14 if you have a 30-pack-year history of smoking and currently smoke or have quit within the past 15 years. Fecal occult blood test (FOBT) of the  stool. You may have this test every year starting at age 23. Flexible sigmoidoscopy or colonoscopy. You may have a sigmoidoscopy every 5 years or a colonoscopy every 10 years starting at age 50. Hepatitis C blood test. Hepatitis B blood test. Sexually transmitted disease (STD) testing. Diabetes screening. This is done by checking your blood sugar (glucose) after you have not eaten for a while (fasting). You may have this done every 1-3 years. Bone density scan. This is done to screen for osteoporosis. You may have this done starting at age 38. Mammogram. This may be done every 1-2 years. Talk to your health care provider about how often you should have regular mammograms. Talk with your health care provider about your test results, treatment options, and if necessary, the need for more tests. Vaccines  Your health care provider may recommend certain vaccines, such as: Influenza vaccine. This is recommended every year. Tetanus, diphtheria, and acellular pertussis (Tdap,  Td) vaccine. You may need a Td booster every 10 years. Zoster vaccine. You may need this after age 60. Pneumococcal 13-valent conjugate (PCV13) vaccine. One dose is recommended after age 78. Pneumococcal polysaccharide (PPSV23) vaccine. One dose is recommended after age 9. Talk to your health care provider about which screenings and vaccines you need and how often you need them. This information is not intended to replace advice given to you by your health care provider. Make sure you discuss any questions you have with your health care provider. Document Released: 02/18/2015 Document Revised: 10/12/2015 Document Reviewed: 11/23/2014 Elsevier Interactive Patient Education  2017 [employer].  Fall Prevention in the Home Falls can cause injuries. They can happen to people of all ages. There are many things you can do to make your home safe and to help prevent falls. What can I do on the outside of my home? Regularly fix the  edges of walkways and driveways and fix any cracks. Remove anything that might make you trip as you walk through a door, such as a raised step or threshold. Trim any bushes or trees on the path to your home. Use bright outdoor lighting. Clear any walking paths of anything that might make someone trip, such as rocks or tools. Regularly check to see if handrails are loose or broken. Make sure that both sides of any steps have handrails. Any raised decks and porches should have guardrails on the edges. Have any leaves, snow, or ice cleared regularly. Use sand or salt on walking paths during winter. Clean up any spills in your garage right away. This includes oil or grease spills. What can I do in the bathroom? Use night lights. Install grab bars by the toilet and in the tub and shower. Do not use towel bars as grab bars. Use non-skid mats or decals in the tub or shower. If you need to sit down in the shower, use a plastic, non-slip stool. Keep the floor dry. Clean up any water that spills on the floor as soon as it happens. Remove soap buildup in the tub or shower regularly. Attach bath mats securely with double-sided non-slip rug tape. Do not have throw rugs and other things on the floor that can make you trip. What can I do in the bedroom? Use night lights. Make sure that you have a light by your bed that is easy to reach. Do not use any sheets or blankets that are too big for your bed. They should not hang down onto the floor. Have a firm chair that has side arms. You can use this for support while you get dressed. Do not have throw rugs and other things on the floor that can make you trip. What can I do in the kitchen? Clean up any spills right away. Avoid walking on wet floors. Keep items that you use a lot in easy-to-reach places. If you need to reach something above you, use a strong step stool that has a grab bar. Keep electrical cords out of the way. Do not use floor polish or  wax that makes floors slippery. If you must use wax, use non-skid floor wax. Do not have throw rugs and other things on the floor that can make you trip. What can I do with my stairs? Do not leave any items on the stairs. Make sure that there are handrails on both sides of the stairs and use them. Fix handrails that are broken or loose. Make sure that handrails are as  long as the stairways. Check any carpeting to make sure that it is firmly attached to the stairs. Fix any carpet that is loose or worn. Avoid having throw rugs at the top or bottom of the stairs. If you do have throw rugs, attach them to the floor with carpet tape. Make sure that you have a light switch at the top of the stairs and the bottom of the stairs. If you do not have them, ask someone to add them for you. What else can I do to help prevent falls? Wear shoes that: Do not have high heels. Have rubber bottoms. Are comfortable and fit you well. Are closed at the toe. Do not wear sandals. If you use a stepladder: Make sure that it is fully opened. Do not climb a closed stepladder. Make sure that both sides of the stepladder are locked into place. Ask someone to hold it for you, if possible. Clearly mark and make sure that you can see: Any grab bars or handrails. First and last steps. Where the edge of each step is. Use tools that help you move around (mobility aids) if they are needed. These include: Canes. Walkers. Scooters. Crutches. Turn on the lights when you go into a dark area. Replace any light bulbs as soon as they burn out. Set up your furniture so you have a clear path. Avoid moving your furniture around. If any of your floors are uneven, fix them. If there are any pets around you, be aware of where they are. Review your medicines with your doctor. Some medicines can make you feel dizzy. This can increase your chance of falling. Ask your doctor what other things that you can do to help prevent falls. This  information is not intended to replace advice given to you by your health care provider. Make sure you discuss any questions you have with your health care provider. Document Released: 11/18/2008 Document Revised: 06/30/2015 Document Reviewed: 02/26/2014 Elsevier Interactive Patient Education  2017 [employer].

## 2022-07-04 NOTE — Progress Notes (Signed)
Subjective:   Diane Grant is a 72 y.o. female who presents for Medicare Annual (Subsequent) preventive examination. I connected with  Tommie Sams on 07/04/22 by a audio enabled telemedicine application and verified that I am speaking with the correct person using two identifiers.  Patient Location: Home  Provider Location: Home Office  I discussed the limitations of evaluation and management by telemedicine. The patient expressed understanding and agreed to proceed.  Review of Systems     Cardiac Risk Factors include: advanced age (>18men, >84 women);hypertension     Objective:    Today's Vitals   07/04/22 0907  Weight: 144 lb (65.3 kg)  Height: 5\' 2"  (1.575 m)   Body mass index is 26.34 kg/m.     07/04/2022    9:11 AM 06/28/2021    8:23 AM 06/03/2020   10:02 AM 06/03/2019    9:57 AM 09/09/2018    5:11 PM 06/02/2018    9:18 AM  Advanced Directives  Does Patient Have a Medical Advance Directive? No No No No No No  Would patient like information on creating a medical advance directive? No - Patient declined No - Patient declined Yes (MAU/Ambulatory/Procedural Areas - Information given) Yes (MAU/Ambulatory/Procedural Areas - Information given) Yes (ED - Information included in AVS) Yes (MAU/Ambulatory/Procedural Areas - Information given)    Current Medications (verified) Outpatient Encounter Medications as of 07/04/2022  Medication Sig   albuterol (VENTOLIN HFA) 108 (90 Base) MCG/ACT inhaler Inhale 2 puffs into the lungs every 4 (four) hours as needed for wheezing or shortness of breath (coughing fits).   Budeson-Glycopyrrol-Formoterol (BREZTRI AEROSPHERE) 160-9-4.8 MCG/ACT AERO Inhale into the lungs.   dicyclomine (BENTYL) 20 MG tablet Take 20 mg by mouth 3 (three) times daily.   fluticasone (FLONASE) 50 MCG/ACT nasal spray Place 2 sprays into both nostrils daily.   hydrOXYzine (ATARAX) 10 MG tablet Take 1 tablet (10 mg total) by mouth 3 (three)  times daily as needed for itching.   levocetirizine (XYZAL) 5 MG tablet Take by mouth.   levothyroxine (EUTHYROX) 75 MCG tablet Take 1 tablet (75 mcg total) by mouth daily before breakfast.   losartan-hydrochlorothiazide (HYZAAR) 100-25 MG tablet Take 1 tablet by mouth daily.   meclizine (ANTIVERT) 25 MG tablet TAKE 1 TABLET BY MOUTH THREE TIMES DAILY AS NEEDED FOR DIZZINESS   meloxicam (MOBIC) 15 MG tablet Take 1 tablet (15 mg total) by mouth daily. (ONLY ONCE DAILY if needed for joint/ back pain)   methocarbamol (ROBAXIN) 500 MG tablet TAKE 1 TABLET (500 MG TOTAL) BY MOUTH 4 (FOUR) TIMES DAILY AS NEEDED FOR MUSCLE SPASMS.   montelukast (SINGULAIR) 10 MG tablet TAKE 1 TABLET BY MOUTH EVERY DAY AT BEDTIME FOR ALLERGY/ASTHMA   potassium chloride SA (KLOR-CON M) 20 MEQ tablet Take 1 tablet (20 mEq total) by mouth daily.   rosuvastatin (CRESTOR) 10 MG tablet Take 1 tablet (10 mg total) by mouth at bedtime. For cholesterol   Spacer/Aero-Holding Chambers (AEROCHAMBER PLUS) inhaler Use as instructed   Wheat Dextrin (BENEFIBER PO) Take by mouth.   No facility-administered encounter medications on file as of 07/04/2022.    Allergies (verified) Anesthesia s-i-40 [propofol]   History: Past Medical History:  Diagnosis Date   Degenerative disc disease, lumbar    Hypertension    Thyroid disease    hypothyroidism   Past Surgical History:  Procedure Laterality Date   ABDOMINAL HYSTERECTOMY     ROTATOR CUFF REPAIR  11/30/2015   Right shoulder   Family History  Problem Relation Age of Onset   Arthritis Mother    Heart attack Father        39s MI   Heart disease Father    Allergic rhinitis Sister    Asthma Sister    Allergic rhinitis Sister    Allergic rhinitis Brother    Heart disease Brother    Cancer Brother        liver   Asthma Maternal Grandmother    Heart attack Maternal Grandmother    Breast cancer Maternal Grandmother    Healthy Daughter    Healthy Daughter    Healthy  Daughter    Healthy Daughter    Healthy Daughter    Food Allergy Niece    Thyroid cancer Neg Hx    Colon cancer Neg Hx    Prostate cancer Neg Hx    Ovarian cancer Neg Hx    Angioedema Neg Hx    Eczema Neg Hx    Atopy Neg Hx    Immunodeficiency Neg Hx    Social History   Socioeconomic History   Marital status: Married    Spouse name: Fayrene Fearing   Number of children: 5   Years of education: 12   Highest education level: High school graduate  Occupational History    Employer: retired  Tobacco Use   Smoking status: Never    Passive exposure: Current (husband smokes 50+ years)   Smokeless tobacco: Never  Vaping Use   Vaping Use: Never used  Substance and Sexual Activity   Alcohol use: No   Drug use: No   Sexual activity: Not Currently  Other Topics Concern   Not on file  Social History Narrative   Lives with husband. Has 3 daughters and 2 step-daughters - all live within an hour away   Social Determinants of Health   Financial Resource Strain: Low Risk  (07/04/2022)   Overall Financial Resource Strain (CARDIA)    Difficulty of Paying Living Expenses: Not hard at all  Food Insecurity: No Food Insecurity (07/04/2022)   Hunger Vital Sign    Worried About Running Out of Food in the Last Year: Never true    Ran Out of Food in the Last Year: Never true  Transportation Needs: No Transportation Needs (07/04/2022)   PRAPARE - Administrator, Civil Service (Medical): No    Lack of Transportation (Non-Medical): No  Physical Activity: Insufficiently Active (07/04/2022)   Exercise Vital Sign    Days of Exercise per Week: 3 days    Minutes of Exercise per Session: 30 min  Stress: No Stress Concern Present (07/04/2022)   [employer] of Occupational Health - Occupational Stress Questionnaire    Feeling of Stress : Not at all  Social Connections: Moderately Integrated (07/04/2022)   Social Connection and Isolation Panel [NHANES]    Frequency of Communication with Friends  and Family: More than three times a week    Frequency of Social Gatherings with Friends and Family: More than three times a week    Attends Religious Services: More than 4 times per year    Active Member of [employer] or Organizations: No    Attends Engineer, structural: Never    Marital Status: Married    Tobacco Counseling Counseling given: Not Answered   Clinical Intake:  Pre-visit preparation completed: Yes  Pain : No/denies pain     Nutritional Risks: None Diabetes: No  How often do you need to have someone help you when you read instructions, pamphlets,  or other written materials from your doctor or pharmacy?: 1 - Never  Diabetic?no   Interpreter Needed?: No  Information entered by :: Renie Ora, LPN   Activities of Daily Living    07/04/2022    9:11 AM  In your present state of health, do you have any difficulty performing the following activities:  Hearing? 0  Vision? 0  Difficulty concentrating or making decisions? 0  Walking or climbing stairs? 0  Dressing or bathing? 0  Doing errands, shopping? 0  Preparing Food and eating ? N  Using the Toilet? N  In the past six months, have you accidently leaked urine? N  Do you have problems with loss of bowel control? N  Managing your Medications? N  Managing your Finances? N  Housekeeping or managing your Housekeeping? N    Patient Care Team: Raliegh Ip, DO as PCP - General (Family Medicine) Jodelle Red, MD as PCP - Cardiology (Cardiology) Jena Gauss Gerrit Friends, MD as Consulting Physician (Gastroenterology) Pleasant, Kerry Kass, PA as Consulting Physician (Gastroenterology) Chilton Greathouse, MD as Consulting Physician (Pulmonary Disease)  Indicate any recent Medical Services you may have received from other than [facility] providers in the past year (date may be approximate).     Assessment:   This is a routine wellness examination for Carilyn.  Hearing/Vision screen Vision Screening -  Comments:: Wears rx glasses - up to date with routine eye exams with  Dr.Lee   Dietary issues and exercise activities discussed: Current Exercise Habits: Home exercise routine, Type of exercise: walking, Time (Minutes): 30, Frequency (Times/Week): 3, Weekly Exercise (Minutes/Week): 90, Intensity: Mild, Exercise limited by: None identified   Goals Addressed             This Visit's Progress    awv goals   On track    06/02/2018 AWV Goal: Keep All Scheduled Appointments  Over the next year, patient will attend all scheduled appointments with their PCP and any specialists that they see.  06/02/2018 AWV Goal: Exercise for General Health  Patient will verbalize understanding of the benefits of increased physical activity: Exercising regularly is important. It will improve your overall fitness, flexibility, and endurance. Regular exercise also will improve your overall health. It can help you control your weight, reduce stress, and improve your bone density. Over the next year, patient will increase physical activity as tolerated with a goal of at least 150 minutes of moderate physical activity per week.  You can tell that you are exercising at a moderate intensity if your heart starts beating faster and you start breathing faster but can still hold a conversation. Moderate-intensity exercise ideas include: Walking 1 mile (1.6 km) in about 15 minutes Biking Hiking Golfing Dancing Water aerobics Patient will verbalize understanding of everyday activities that increase physical activity by providing examples like the following: Yard work, such as: Insurance underwriter Gardening Washing windows or floors Patient will be able to explain general safety guidelines for exercising:  Before you start a new exercise program, talk with your health care provider. Do not exercise so much that you hurt yourself, feel  dizzy, or get very short of breath. Wear comfortable clothes and wear shoes with good support. Drink plenty of water while you exercise to prevent dehydration or heat stroke. Work out until your breathing and your heartbeat get faster.        Depression Screen    07/04/2022  9:10 AM 07/25/2021    4:05 PM 06/28/2021    8:20 AM 06/17/2020    8:18 AM 06/03/2020    9:57 AM 03/29/2020    4:17 PM 06/23/2019    4:15 PM  PHQ 2/9 Scores  PHQ - 2 Score 0 0 0 0 0 0 0  PHQ- 9 Score       0    Fall Risk    07/04/2022    9:08 AM 07/25/2021    4:05 PM 06/28/2021    8:24 AM 06/17/2020    8:18 AM 06/03/2020   10:08 AM  Fall Risk   Falls in the past year? 0 0 0 0 0  Number falls in past yr: 0  0  0  Injury with Fall? 0  0  0  Risk for fall due to : No Fall Risks  No Fall Risks  No Fall Risks  Follow up Falls prevention discussed  Falls prevention discussed  Falls prevention discussed    FALL RISK PREVENTION PERTAINING TO THE HOME:  Any stairs in or around the home? No  If so, are there any without handrails? No  Home free of loose throw rugs in walkways, pet beds, electrical cords, etc? Yes  Adequate lighting in your home to reduce risk of falls? Yes   ASSISTIVE DEVICES UTILIZED TO PREVENT FALLS:  Life alert? No  Use of a cane, walker or w/c? No  Grab bars in the bathroom? No  Shower chair or bench in shower? No  Elevated toilet seat or a handicapped toilet? No        07/04/2022    9:11 AM 06/28/2021    8:26 AM 06/03/2019   10:08 AM 06/02/2018    9:23 AM  6CIT Screen  What Year? 0 points 0 points 0 points 0 points  What month? 0 points 0 points 0 points 0 points  What time? 0 points 0 points 0 points 0 points  Count back from 20 0 points 0 points 0 points 0 points  Months in reverse 0 points 0 points 0 points 4 points  Repeat phrase 0 points 0 points 0 points 0 points  Total Score 0 points 0 points 0 points 4 points    Immunizations Immunization History  Administered Date(s)  Administered   Fluad Quad(high Dose 65+) 12/01/2021   Influenza, High Dose Seasonal PF 11/15/2015, 02/24/2017, 12/04/2017   Influenza,inj,Quad PF,6+ Mos 02/19/2017   Influenza-Unspecified 01/06/2020, 11/18/2020   PFIZER(Purple Top)SARS-COV-2 Vaccination 02/24/2019, 03/17/2019, 11/29/2019   Pneumococcal Conjugate-13 06/23/2019   Pneumococcal Polysaccharide-23 03/28/2016   Tdap 06/15/2015   Zoster Recombinat (Shingrix) 05/31/2021   Zoster, Live 08/10/2021    TDAP status: Up to date  Flu Vaccine status: Up to date  Pneumococcal vaccine status: Up to date  Covid-19 vaccine status: Completed vaccines  Qualifies for Shingles Vaccine? Yes   Zostavax completed No   Shingrix Completed?: No.    Education has been provided regarding the importance of this vaccine. Patient has been advised to call insurance company to determine out of pocket expense if they have not yet received this vaccine. Advised may also receive vaccine at local pharmacy or Health Dept. Verbalized acceptance and understanding.  Screening Tests Health Maintenance  Topic Date Due   Zoster Vaccines- Shingrix (2 of 2) 07/26/2021   COVID-19 Vaccine (4 - 2023-24 season) 10/06/2021   INFLUENZA VACCINE  09/06/2022   MAMMOGRAM  03/06/2023   Medicare Annual Wellness (AWV)  07/04/2023   DTaP/Tdap/Td (2 - Td  or Tdap) 06/14/2025   DEXA SCAN  06/21/2025   Colonoscopy  03/28/2027   Pneumonia Vaccine 51+ Years old  Completed   Hepatitis C Screening  Completed   HPV VACCINES  Aged Out    Health Maintenance  Health Maintenance Due  Topic Date Due   Zoster Vaccines- Shingrix (2 of 2) 07/26/2021   COVID-19 Vaccine (4 - 2023-24 season) 10/06/2021    Colorectal cancer screening: Type of screening: Colonoscopy. Completed 03/27/2022. Repeat every 5 years  Mammogram status: Completed 03/05/2022. Repeat every year  Bone Density status: Completed 06/21/2020. Results reflect: Bone density results: OSTEOPENIA. Repeat every 5  years.  Lung Cancer Screening: (Low Dose CT Chest recommended if Age 47-80 years, 30 pack-year currently smoking OR have quit w/in 15years.) does not qualify.   Lung Cancer Screening Referral: n/a  Additional Screening:  Hepatitis C Screening: does not qualify; Completed 06/13/2016  Vision Screening: Recommended annual ophthalmology exams for early detection of glaucoma and other disorders of the eye. Is the patient up to date with their annual eye exam?  Yes  Who is the provider or what is the name of the office in which the patient attends annual eye exams? Dr.Lee  If pt is not established with a provider, would they like to be referred to a provider to establish care? No .   Dental Screening: Recommended annual dental exams for proper oral hygiene  Community Resource Referral / Chronic Care Management: CRR required this visit?  No   CCM required this visit?  No      Plan:     I have personally reviewed and noted the following in the patient's chart:   Medical and social history Use of alcohol, tobacco or illicit drugs  Current medications and supplements including opioid prescriptions. Patient is not currently taking opioid prescriptions. Functional ability and status Nutritional status Physical activity Advanced directives List of other physicians Hospitalizations, surgeries, and ER visits in previous 12 months Vitals Screenings to include cognitive, depression, and falls Referrals and appointments  In addition, I have reviewed and discussed with patient certain preventive protocols, quality metrics, and best practice recommendations. A written personalized care plan for preventive services as well as general preventive health recommendations were provided to patient.     Lorrene Reid, LPN   05/14/8117   Nurse Notes: none

## 2022-08-20 ENCOUNTER — Ambulatory Visit (INDEPENDENT_AMBULATORY_CARE_PROVIDER_SITE_OTHER): Payer: Medicare Other | Admitting: Nurse Practitioner

## 2022-08-20 ENCOUNTER — Encounter: Payer: Self-pay | Admitting: Nurse Practitioner

## 2022-08-20 VITALS — BP 125/75 | HR 75 | Temp 97.8°F | Resp 20 | Ht 62.0 in | Wt 139.0 lb

## 2022-08-20 DIAGNOSIS — R946 Abnormal results of thyroid function studies: Secondary | ICD-10-CM | POA: Diagnosis not present

## 2022-08-20 DIAGNOSIS — J00 Acute nasopharyngitis [common cold]: Secondary | ICD-10-CM

## 2022-08-20 DIAGNOSIS — J040 Acute laryngitis: Secondary | ICD-10-CM | POA: Diagnosis not present

## 2022-08-20 DIAGNOSIS — E039 Hypothyroidism, unspecified: Secondary | ICD-10-CM | POA: Diagnosis not present

## 2022-08-20 MED ORDER — FLUTICASONE PROPIONATE 50 MCG/ACT NA SUSP: 2.0000 | Freq: Every day | NASAL | 6 refills | Status: AC

## 2022-08-20 MED ORDER — PREDNISONE 20 MG PO TABS: 40.0000 mg | ORAL_TABLET | Freq: Every day | ORAL | 0 refills | Status: AC

## 2022-08-20 MED ORDER — BENZONATATE 100 MG PO CAPS: 100.0000 mg | ORAL_CAPSULE | Freq: Three times a day (TID) | ORAL | 0 refills | Status: DC | PRN

## 2022-08-20 NOTE — Patient Instructions (Signed)

## 2022-08-20 NOTE — Progress Notes (Signed)
Subjective:    Patient ID: Diane Grant, female    DOB: [birth year], 72 y.o.   MRN: [id number]   Chief Complaint: Generalized Body Aches and sinus drainage   URI  This is a new problem. The current episode started yesterday. The problem has been gradually improving. The fever has been present for 1 to 2 days. Associated symptoms include congestion, coughing, headaches, rhinorrhea and a sore throat. Treatments tried: corcidin. The treatment provided mild relief.   Covid test at home was negative She would like her thyroid retested today  Patient Active Problem List   Diagnosis Date Noted   Second hand tobacco smoke exposure 06/15/2021   Other allergic rhinitis 06/15/2021   Other adverse food reactions, not elsewhere classified, subsequent encounter 06/15/2021   Irritable bowel syndrome with constipation 11/16/2020   Cough 03/29/2020   Acute pharyngitis 03/29/2020   Urticaria 11/27/2019   Palpitations 06/16/2018   Chest pain of uncertain etiology 06/16/2018   Shortness of breath 06/16/2018   Educated about COVID-19 virus infection 06/16/2018   Change in voice 03/08/2017   Degenerative disc disease, lumbar 03/08/2017   Hypothyroidism 06/26/2013   Essential hypertension 06/26/2013       Review of Systems  Constitutional:  Positive for chills, fatigue and fever.  HENT:  Positive for congestion, rhinorrhea and sore throat.   Respiratory:  Positive for cough and shortness of breath.   Neurological:  Positive for headaches.       Objective:   Physical Exam Vitals and nursing note reviewed.  Constitutional:      General: She is not in acute distress.    Appearance: Normal appearance. She is well-developed.  HENT:     Head: Normocephalic.     Right Ear: Tympanic membrane normal.     Left Ear: Tympanic membrane normal.     Nose: Congestion present.     Right Sinus: No maxillary sinus tenderness.     Left Sinus: No maxillary sinus tenderness.      Mouth/Throat:     Mouth: Mucous membranes are moist.  Eyes:     Pupils: Pupils are equal, round, and reactive to light.  Neck:     Vascular: No carotid bruit or JVD.  Cardiovascular:     Rate and Rhythm: Normal rate and regular rhythm.     Heart sounds: Normal heart sounds.  Pulmonary:     Effort: Pulmonary effort is normal. No respiratory distress.     Breath sounds: Normal breath sounds. No wheezing or rales.  Chest:     Chest wall: No tenderness.  Abdominal:     General: Bowel sounds are normal. There is no distension or abdominal bruit.     Palpations: Abdomen is soft. There is no hepatomegaly, splenomegaly, mass or pulsatile mass.     Tenderness: There is no abdominal tenderness.  Musculoskeletal:        General: Normal range of motion.     Cervical back: Normal range of motion and neck supple.  Lymphadenopathy:     Cervical: No cervical adenopathy.  Skin:    General: Skin is warm and dry.  Neurological:     Mental Status: She is alert and oriented to person, place, and time.     Deep Tendon Reflexes: Reflexes are normal and symmetric.  Psychiatric:        Behavior: Behavior normal.        Thought Content: Thought content normal.        Judgment: Judgment normal.  BP 125/75   Pulse 75   Temp 97.8 F (36.6 C) (Temporal)   Resp 20   Ht 5\' 2"  (1.575 m)   Wt 139 lb (63 kg)   SpO2 99%   BMI 25.42 kg/m          Diane Grant in today with chief complaint of Generalized Body Aches and sinus drainage   1. Acute nasopharyngitis 2. Laryngitis 1. Take meds as prescribed 2. Use a cool mist humidifier especially during the winter months and when heat has been humid. 3. Use saline nose sprays frequently 4. Saline irrigations of the nose can be very helpful if done frequently.  * 4X daily for 1 week*  * Use of a nettie pot can be helpful with this. Follow directions with this* 5. Drink plenty of fluids 6. Keep thermostat turn down low 7.For any cough  or congestion- tessalon perles 8. For fever or aces or pains- take tylenol or ibuprofen appropriate for age and weight.  * for fevers greater than 101 orally you may alternate ibuprofen and tylenol every  3 hours.   Meds ordered this encounter  Medications   fluticasone (FLONASE) 50 MCG/ACT nasal spray    Sig: Place 2 sprays into both nostrils daily.    Dispense:  16 g    Refill:  6    Order Specific Question:   Supervising Provider    Answer:   Arville Care A [[id number]]   predniSONE (DELTASONE) 20 MG tablet    Sig: Take 2 tablets (40 mg total) by mouth daily with breakfast for 5 days. 2 po daily for 5 days    Dispense:  10 tablet    Refill:  0    Order Specific Question:   Supervising Provider    Answer:   Arville Care A [[id number]]   benzonatate (TESSALON PERLES) 100 MG capsule    Sig: Take 1 capsule (100 mg total) by mouth 3 (three) times daily as needed.    Dispense:  20 capsule    Refill:  0    Order Specific Question:   Supervising Provider    Answer:   Arville Care A [[id number]]       The above assessment and management plan was discussed with the patient. The patient verbalized understanding of and has agreed to the management plan. Patient is aware to call the clinic if symptoms persist or worsen. Patient is aware when to return to the clinic for a follow-up visit. Patient educated on when it is appropriate to go to the emergency department.   Mary-Margaret Daphine Deutscher, FNP

## 2022-08-21 ENCOUNTER — Telehealth: Payer: Self-pay | Admitting: Family Medicine

## 2022-08-21 DIAGNOSIS — U071 COVID-19: Secondary | ICD-10-CM | POA: Diagnosis not present

## 2022-08-21 LAB — TSH: TSH: 0.117 u[IU]/mL — ABNORMAL LOW (ref 0.450–4.500)

## 2022-08-21 NOTE — Telephone Encounter (Signed)
Called and spoke to the patient - she seen Diane Grant yesterday fir fever(100.8), sinus drainage, ST, and cough. Negative home COVID test but the test was expired. Patient is also complaining of dark urine that feels "hot". She also c/o felling tired, dizziness, and tingling all over her body. She states that she has been feeling weak and tired for 3-4 wks and requested blood work yesterday for thyroid - results are in the chart - they are 0.117. Please review and advise

## 2022-08-21 NOTE — Telephone Encounter (Signed)
Pt rc wants to let provider know that she tested positive for covid. She wants to know if abx can be called in to [facility] in [facility]

## 2022-08-21 NOTE — Telephone Encounter (Signed)
MMM, may have already interpreted/ added labs but I suspect she will need a Ft4 added to lab.

## 2022-08-21 NOTE — Telephone Encounter (Signed)
Pt wants to speak with someone regarding her lab results.

## 2022-08-21 NOTE — Telephone Encounter (Signed)
We do not treat covid with antibiotics. We use antiviral. Would she like that

## 2022-08-22 NOTE — Telephone Encounter (Signed)
Patient aware and states that she got the anti viral medication yesterday.  States that she would like to talk to Dr. Reece Agar and her only.  Asked what it was concerning and she said what was going on.  Would not give any other information.  Explained to patient that she was with patients all day and patient states that was fine and she can call whenever she has a chance.

## 2022-08-22 NOTE — Telephone Encounter (Signed)
Spoke to pt.  She is on antivirals through an e-visit doctor.  She is Secretary/administrator.  Wanted to double check about thyroid lab timing.  I agree waiting a few weeks until acute illness resolved before rechecking.  Ft4 still pending.  Offered recheck here with me on 8/12 or 8/13.  She is going to check her calendar and call back.  Please route call to [facility], as we'll have to work her in.

## 2022-08-23 ENCOUNTER — Other Ambulatory Visit: Payer: Self-pay | Admitting: Family Medicine

## 2022-08-23 LAB — T4, FREE: Free T4: 1.99 ng/dL — ABNORMAL HIGH (ref 0.82–1.77)

## 2022-08-23 LAB — SPECIMEN STATUS REPORT

## 2022-08-28 DIAGNOSIS — R112 Nausea with vomiting, unspecified: Secondary | ICD-10-CM | POA: Diagnosis not present

## 2022-08-28 DIAGNOSIS — Z03818 Encounter for observation for suspected exposure to other biological agents ruled out: Secondary | ICD-10-CM | POA: Diagnosis not present

## 2022-08-28 DIAGNOSIS — J209 Acute bronchitis, unspecified: Secondary | ICD-10-CM | POA: Diagnosis not present

## 2022-08-28 DIAGNOSIS — R197 Diarrhea, unspecified: Secondary | ICD-10-CM | POA: Diagnosis not present

## 2022-08-28 NOTE — Telephone Encounter (Signed)
Attempted to call pt - no answer left vm for cb   Also left apt date in vm to call us if she needs something else

## 2022-09-05 ENCOUNTER — Other Ambulatory Visit: Payer: Self-pay | Admitting: Family Medicine

## 2022-09-10 DIAGNOSIS — R1013 Epigastric pain: Secondary | ICD-10-CM | POA: Diagnosis not present

## 2022-09-10 DIAGNOSIS — Z86010 Personal history of colonic polyps: Secondary | ICD-10-CM | POA: Diagnosis not present

## 2022-09-10 DIAGNOSIS — R1310 Dysphagia, unspecified: Secondary | ICD-10-CM | POA: Diagnosis not present

## 2022-09-10 DIAGNOSIS — K581 Irritable bowel syndrome with constipation: Secondary | ICD-10-CM | POA: Diagnosis not present

## 2022-09-10 DIAGNOSIS — R112 Nausea with vomiting, unspecified: Secondary | ICD-10-CM | POA: Diagnosis not present

## 2022-09-10 DIAGNOSIS — K219 Gastro-esophageal reflux disease without esophagitis: Secondary | ICD-10-CM | POA: Diagnosis not present

## 2022-09-10 DIAGNOSIS — R140 Abdominal distension (gaseous): Secondary | ICD-10-CM | POA: Diagnosis not present

## 2022-09-17 ENCOUNTER — Other Ambulatory Visit: Payer: Self-pay | Admitting: Family Medicine

## 2022-09-17 ENCOUNTER — Ambulatory Visit (INDEPENDENT_AMBULATORY_CARE_PROVIDER_SITE_OTHER): Payer: Medicare Other | Admitting: Family Medicine

## 2022-09-17 ENCOUNTER — Encounter: Payer: Self-pay | Admitting: Family Medicine

## 2022-09-17 VITALS — BP 120/66 | HR 71 | Temp 97.4°F | Resp 20 | Ht 62.0 in | Wt 143.1 lb

## 2022-09-17 DIAGNOSIS — I6523 Occlusion and stenosis of bilateral carotid arteries: Secondary | ICD-10-CM | POA: Diagnosis not present

## 2022-09-17 DIAGNOSIS — E876 Hypokalemia: Secondary | ICD-10-CM

## 2022-09-17 DIAGNOSIS — I10 Essential (primary) hypertension: Secondary | ICD-10-CM

## 2022-09-17 DIAGNOSIS — E039 Hypothyroidism, unspecified: Secondary | ICD-10-CM | POA: Diagnosis not present

## 2022-09-17 DIAGNOSIS — E7800 Pure hypercholesterolemia, unspecified: Secondary | ICD-10-CM

## 2022-09-17 LAB — LIPID PANEL: Triglycerides: 98 mg/dL (ref 0–149)

## 2022-09-17 LAB — CMP14+EGFR
ALT: 20 IU/L (ref 0–32)
AST: 27 IU/L (ref 0–40)
Albumin: 4.4 g/dL (ref 3.8–4.8)
Alkaline Phosphatase: 71 IU/L (ref 44–121)
BUN/Creatinine Ratio: 15 (ref 12–28)
BUN: 10 mg/dL (ref 8–27)
Bilirubin Total: 0.5 mg/dL (ref 0.0–1.2)
CO2: 22 mmol/L (ref 20–29)
Calcium: 9.6 mg/dL (ref 8.7–10.3)
Chloride: 103 mmol/L (ref 96–106)
Creatinine, Ser: 0.68 mg/dL (ref 0.57–1.00)
Globulin, Total: 2.1 g/dL (ref 1.5–4.5)
Glucose: 84 mg/dL (ref 70–99)
Potassium: 3.8 mmol/L (ref 3.5–5.2)
Sodium: 139 mmol/L (ref 134–144)
Total Protein: 6.5 g/dL (ref 6.0–8.5)
eGFR: 92 mL/min/{1.73_m2} (ref >59)

## 2022-09-17 LAB — CBC
Hematocrit: 41.9 % (ref 34.0–46.6)
Hemoglobin: 13.8 g/dL (ref 11.1–15.9)
MCH: 29.9 pg (ref 26.6–33.0)
MCHC: 32.9 g/dL (ref 31.5–35.7)
MCV: 91 fL (ref 79–97)
Platelets: 240 10*3/uL (ref 150–450)
RBC: 4.62 x10E6/uL (ref 3.77–5.28)
RDW: 12.6 % (ref 11.7–15.4)
WBC: 5.6 10*3/uL (ref 3.4–10.8)

## 2022-09-17 LAB — TSH

## 2022-09-17 LAB — T4, FREE

## 2022-09-17 MED ORDER — POTASSIUM CHLORIDE CRYS ER 20 MEQ PO TBCR: 20.0000 meq | EXTENDED_RELEASE_TABLET | Freq: Every day | ORAL | 3 refills | Status: DC

## 2022-09-17 MED ORDER — LOSARTAN POTASSIUM-HCTZ 100-25 MG PO TABS: 1.0000 | ORAL_TABLET | Freq: Every day | ORAL | 3 refills | Status: DC

## 2022-09-17 MED ORDER — MONTELUKAST SODIUM 10 MG PO TABS: 10.0000 mg | ORAL_TABLET | Freq: Every day | ORAL | 3 refills | Status: DC

## 2022-09-17 MED ORDER — ROSUVASTATIN CALCIUM 10 MG PO TABS: 10.0000 mg | ORAL_TABLET | Freq: Every day | ORAL | 3 refills | Status: DC

## 2022-09-17 NOTE — Addendum Note (Signed)
Addended by: Julious Payer D on: 09/17/2022 11:04 AM   Modules accepted: Orders

## 2022-09-17 NOTE — Patient Instructions (Signed)
You had labs performed today.  You will be contacted with the results of the labs once they are available, usually in the next 3 business days for routine lab work.  If you have an active my chart account, they will be released to your MyChart.  If you prefer to have these labs released to you via telephone, please let us know.     

## 2022-09-17 NOTE — Progress Notes (Signed)
Subjective: CC:HTN, HLD, hypothyroidism PCP: Raliegh Ip, DO ZOX:WRUEAVW Diane Grant is a 72 y.o. female presenting to clinic today for:  1.  Hypertension with hyperlipidemia/GI pain Patient is compliant with medications.  She reports no chest pain.  She is out of the [place] [facility] and has not gotten supplied a bit.  She has not yet followed up with Raynelle Fanning but will make a point to reach out to her to see if they can continue this medication for her.  She notes that she is back on her stomach medication per GI and was placed on a couple weeks of Linzess as well because she continues to have ascending and descending colon pain.  She will be seeing them again in 1 week to determine if she needs imaging.  2.  Hypothyroidism Compliant with Synthroid.  No reports of tremor, heart palpitations.  She continues to not quite be back to her baseline after having been infected with COVID-19.      ROS: Per HPI  Allergies  Allergen Reactions   Anesthesia S-I-40 [Propofol] Nausea And Vomiting    Per patient need to be careful when giving this   Past Medical History:  Diagnosis Date   Degenerative disc disease, lumbar    Hypertension    Thyroid disease    hypothyroidism    Current Outpatient Medications:    albuterol (VENTOLIN HFA) 108 (90 Base) MCG/ACT inhaler, Inhale 2 puffs into the lungs every 4 (four) hours as needed for wheezing or shortness of breath (coughing fits)., Disp: 18 g, Rfl: 1   benzonatate (TESSALON PERLES) 100 MG capsule, Take 1 capsule (100 mg total) by mouth 3 (three) times daily as needed., Disp: 20 capsule, Rfl: 0   Budeson-Glycopyrrol-Formoterol (BREZTRI AEROSPHERE) 160-9-4.8 MCG/ACT AERO, Inhale into the lungs., Disp: , Rfl:    dicyclomine (BENTYL) 20 MG tablet, Take 20 mg by mouth 3 (three) times daily., Disp: , Rfl:    fluticasone (FLONASE) 50 MCG/ACT nasal spray, Place 2 sprays into both nostrils daily., Disp: 16 g, Rfl: 6   fluticasone (FLONASE) 50 MCG/ACT  nasal spray, Place 2 sprays into both nostrils daily., Disp: 16 g, Rfl: 6   hydrOXYzine (ATARAX) 10 MG tablet, Take 1 tablet (10 mg total) by mouth 3 (three) times daily as needed for itching., Disp: 40 tablet, Rfl: 0   levocetirizine (XYZAL) 5 MG tablet, Take by mouth., Disp: , Rfl:    levothyroxine (EUTHYROX) 75 MCG tablet, Take 1 tablet (75 mcg total) by mouth daily before breakfast. **NEEDS TO BE SEEN BEFORE NEXT REFILL**, Disp: 30 tablet, Rfl: 0   losartan-hydrochlorothiazide (HYZAAR) 100-25 MG tablet, Take 1 tablet by mouth daily. (NEEDS TO BE SEEN BEFORE NEXT REFILL), Disp: 30 tablet, Rfl: 0   meclizine (ANTIVERT) 25 MG tablet, TAKE 1 TABLET BY MOUTH THREE TIMES DAILY AS NEEDED FOR DIZZINESS, Disp: 30 tablet, Rfl: 0   meloxicam (MOBIC) 15 MG tablet, Take 1 tablet (15 mg total) by mouth daily. (ONLY ONCE DAILY if needed for joint/ back pain), Disp: 30 tablet, Rfl: 3   methocarbamol (ROBAXIN) 500 MG tablet, TAKE 1 TABLET (500 MG TOTAL) BY MOUTH 4 (FOUR) TIMES DAILY AS NEEDED FOR MUSCLE SPASMS., Disp: 40 tablet, Rfl: 0   montelukast (SINGULAIR) 10 MG tablet, TAKE 1 TABLET BY MOUTH EVERY DAY AT BEDTIME FOR ALLERGY/ASTHMA, Disp: 90 tablet, Rfl: 1   potassium chloride SA (KLOR-CON M) 20 MEQ tablet, Take 1 tablet (20 mEq total) by mouth daily., Disp: 90 tablet, Rfl: 3  rosuvastatin (CRESTOR) 10 MG tablet, Take 1 tablet (10 mg total) by mouth at bedtime. For cholesterol, Disp: 90 tablet, Rfl: 3   Spacer/Aero-Holding Chambers (AEROCHAMBER PLUS) inhaler, Use as instructed, Disp: 1 each, Rfl: 2   Wheat Dextrin (BENEFIBER PO), Take by mouth., Disp: , Rfl:  Social History   Socioeconomic History   Marital status: Married    Spouse name: Fayrene Fearing   Number of children: 5   Years of education: 12   Highest education level: High school graduate  Occupational History    Employer: retired  Tobacco Use   Smoking status: Never    Passive exposure: Current (husband smokes 50+ years)   Smokeless tobacco:  Never  Vaping Use   Vaping status: Never Used  Substance and Sexual Activity   Alcohol use: No   Drug use: No   Sexual activity: Not Currently  Other Topics Concern   Not on file  Social History Narrative   Lives with husband. Has 3 daughters and 2 step-daughters - all live within an hour away   Social Determinants of Health   Financial Resource Strain: Patient Declined (09/07/2022)   Received [facility]   Overall Financial Resource Strain (CARDIA)    Difficulty of Paying Living Expenses: Patient declined  Food Insecurity: Patient Declined (09/07/2022)   Received from  [facility]   Hunger Vital Sign    Worried About Running Out of Food in the Last Year: Patient declined    Ran Out of Food in the Last Year: Patient declined  Transportation Needs: Patient Declined (09/07/2022)   Received from [facility] - Transportation    Lack of Transportation (Medical): Patient declined    Lack of Transportation (Non-Medical): Patient declined  Physical Activity: Unknown (09/07/2022)   Received from [facility]   Exercise Vital Sign    Days of Exercise per Week: Patient declined    Minutes of Exercise per Session: Not on file  Recent Concern: Physical Activity - Insufficiently Active (07/04/2022)   Exercise Vital Sign    Days of Exercise per Week: 3 days    Minutes of Exercise per Session: 30 min  Stress: Patient Declined (09/07/2022)   Received from [facility] of Occupational Health - Occupational Stress Questionnaire    Feeling of Stress : Patient declined  Social Connections: Moderately Integrated (07/04/2022)   Social Connection and Isolation Panel [NHANES]    Frequency of Communication with Friends and Family: More than three times a week    Frequency of Social Gatherings with Friends and Family: More than three times a week    Attends Religious Services: More than 4 times per year    Active Member of [employer] or Organizations: No    Attends Occupational hygienist Meetings: Never    Marital Status: Married  Catering manager Violence: Unknown (09/07/2022)   Received from [facility]   HITS    Over the last 12 months how often did your partner physically hurt you?: 1    Insult or Talk Down To: Not on file    Threaten Physical Harm: Not on file    Scream or Curse: Not on file   Family History  Problem Relation Age of Onset   Arthritis Mother    Heart attack Father        60s MI   Heart disease Father    Allergic rhinitis Sister    Asthma Sister    Allergic rhinitis Sister    Allergic  rhinitis Brother    Heart disease Brother    Cancer Brother        liver   Asthma Maternal Grandmother    Heart attack Maternal Grandmother    Breast cancer Maternal Grandmother    Healthy Daughter    Healthy Daughter    Healthy Daughter    Healthy Daughter    Healthy Daughter    Food Allergy Niece    Thyroid cancer Neg Hx    Colon cancer Neg Hx    Prostate cancer Neg Hx    Ovarian cancer Neg Hx    Angioedema Neg Hx    Eczema Neg Hx    Atopy Neg Hx    Immunodeficiency Neg Hx     Objective: Office vital signs reviewed. BP 120/66   Pulse 71   Temp (!) 97.4 F (36.3 C) (Oral)   Resp 20   Ht 5\' 2"  (1.575 m)   Wt 143 lb 2 oz (64.9 kg)   SpO2 97%   BMI 26.18 kg/m   Physical Examination:  General: Awake, alert, tired appearing, No acute distress HEENT: Sclera white.  No exophthalmos.  No goiter Cardio: regular rate and rhythm, S1S2 heard, no murmurs appreciated Pulm: clear to auscultation bilaterally, no wheezes, rhonchi or rales; normal work of breathing on room air Extremities: warm, well perfused, No edema, cyanosis or clubbing; +2 pulses bilaterally MSK: Normal, independent gait.  Assessment/ Plan: 72 y.o. female   Pure hypercholesterolemia - Plan: Lipid Panel, rosuvastatin (CRESTOR) 10 MG tablet  Atherosclerosis of both carotid arteries - Plan: CMP14+EGFR, CBC, Lipid Panel  Acquired hypothyroidism - Plan: TSH, T4,  Free  Essential hypertension - Plan: CMP14+EGFR, losartan-hydrochlorothiazide (HYZAAR) 100-25 MG tablet  Hypokalemia - Plan: potassium chloride SA (KLOR-CON M) 20 MEQ tablet  Fasting labs collected today.  Continue statin.  Rx has been renewed  Check thyroid levels.  Will plan to renew Synthroid pending result in case there is a dose adjustment needed.  Blood pressure was well-controlled.  No changes needed.  Continue losartan  I have renewed her potassium as well.  Will check potassium as above.  I would like to see her back for full physical examination at her next thyroid checkup.  She did discuss with me that her daughter was recommending she consider switching clinics due to recent experience with one of her other providers when she was infected with COVID-19.  Whilst I would be absolutely sad to see her go I can understand that getting into see me acutely is quite difficult and does result in seeing other providers at times.  I did give her information that should she need to be seen in the future, to contact my nurse [facility] and I am glad to work her in when possible.  However, I will fully support any decision that she makes as I want her to have the best care possible.  Raliegh Ip, DO [facility] [phone]

## 2022-09-18 ENCOUNTER — Other Ambulatory Visit: Payer: Self-pay | Admitting: Family Medicine

## 2022-09-18 MED ORDER — LEVOTHYROXINE SODIUM 75 MCG PO TABS: 75.0000 ug | ORAL_TABLET | Freq: Every day | ORAL | 3 refills | Status: DC

## 2022-10-05 DIAGNOSIS — K5730 Diverticulosis of large intestine without perforation or abscess without bleeding: Secondary | ICD-10-CM | POA: Diagnosis not present

## 2022-10-05 DIAGNOSIS — R1013 Epigastric pain: Secondary | ICD-10-CM | POA: Diagnosis not present

## 2022-10-05 DIAGNOSIS — R109 Unspecified abdominal pain: Secondary | ICD-10-CM | POA: Diagnosis not present

## 2022-10-05 DIAGNOSIS — K581 Irritable bowel syndrome with constipation: Secondary | ICD-10-CM | POA: Diagnosis not present

## 2022-10-06 ENCOUNTER — Other Ambulatory Visit: Payer: Self-pay | Admitting: Family Medicine

## 2022-10-19 DIAGNOSIS — R1013 Epigastric pain: Secondary | ICD-10-CM | POA: Diagnosis not present

## 2022-10-25 ENCOUNTER — Other Ambulatory Visit: Payer: Self-pay | Admitting: Family Medicine

## 2022-10-25 ENCOUNTER — Telehealth: Payer: Self-pay | Admitting: Family Medicine

## 2022-10-25 NOTE — Telephone Encounter (Signed)
Informed pt that this was sent back letting pharmacy know that this was refilled on 10/19/22 for #100 not 100 mcg. The dose was correct.

## 2022-11-06 ENCOUNTER — Encounter: Payer: Self-pay | Admitting: Family Medicine

## 2022-11-06 ENCOUNTER — Other Ambulatory Visit: Payer: Self-pay | Admitting: Family Medicine

## 2022-11-06 ENCOUNTER — Ambulatory Visit (INDEPENDENT_AMBULATORY_CARE_PROVIDER_SITE_OTHER): Payer: Medicare Other | Admitting: Family Medicine

## 2022-11-06 VITALS — BP 103/67 | HR 66 | Temp 97.7°F | Resp 18 | Ht 63.0 in | Wt 141.3 lb

## 2022-11-06 DIAGNOSIS — R053 Chronic cough: Secondary | ICD-10-CM | POA: Diagnosis not present

## 2022-11-06 DIAGNOSIS — Z7689 Persons encountering health services in other specified circumstances: Secondary | ICD-10-CM | POA: Diagnosis not present

## 2022-11-06 DIAGNOSIS — K219 Gastro-esophageal reflux disease without esophagitis: Secondary | ICD-10-CM | POA: Diagnosis not present

## 2022-11-06 DIAGNOSIS — M51362 Other intervertebral disc degeneration, lumbar region with discogenic back pain and lower extremity pain: Secondary | ICD-10-CM

## 2022-11-06 DIAGNOSIS — J3089 Other allergic rhinitis: Secondary | ICD-10-CM | POA: Diagnosis not present

## 2022-11-06 NOTE — Progress Notes (Signed)
New Patient Office Visit  Subjective    Patient ID: Diane Grant, female    DOB: 1950/12/22  Age: 72 y.o. MRN: [id number]  CC:  Chief Complaint  Patient presents with   Establish Care    Patient is here to establish care with a new PCP, she states that she has had an ongoing dry cough for about a couple of months off and on , she would like to also discuss medication management of her medications she takes daily, Patient did not bring any medications with her.    HPI Diane Grant presents to establish care. Pt is new to me. Pt requests her daughter who is with her today; leave the exam room. Her request was granted.  She is looking to transfer care from [facility] medicine.  Pt reports hx of cough. She reports it's likely the allergies. She says it's dry and has postnasal drainage. She says the fall is her worse time of year. She hasn't started back taking the Xyzal or Singulair at this time. Pt has questions about if she has asthma? She says there are times she is outside during the summer and spring; she has had episodes where she has SOB with certain exposures. At that time, they have added her on Albuterol inhaler and Symbicort of which she isn't consistent with it's usage. She also has Xyzal and Singulair on her list that she reports she haven't been taking consistently.  She reports she has hx of DDD of lumbar spine. She says the provider she was seeing (unknown to patient) discussed surgery so she never went back. She is having back pain that shoots down left leg. She takes  Mobic 15mg  daily prn for this. She says this along with a muscle relaxer helps. (There is no muscle relaxer on her medication list today). She has hx of GERD and has a GI provider. They recently added her on Doxepin 10mg  at night for IBS symptoms. She also has Omeprazole 40mg  on her list but doesn't take routinely. She has questions on why she is on so many medicines. She  didn't bring any of her medicines today.   Outpatient Encounter Medications as of 11/06/2022  Medication Sig   albuterol (VENTOLIN HFA) 108 (90 Base) MCG/ACT inhaler Inhale 2 puffs into the lungs every 4 (four) hours as needed for wheezing or shortness of breath (coughing fits).   Budeson-Glycopyrrol-Formoterol (BREZTRI AEROSPHERE) 160-9-4.8 MCG/ACT AERO Inhale into the lungs.   dicyclomine (BENTYL) 20 MG tablet Take 20 mg by mouth 3 (three) times daily.   fluticasone (FLONASE) 50 MCG/ACT nasal spray Place 2 sprays into both nostrils daily.   levocetirizine (XYZAL) 5 MG tablet Take by mouth.   levothyroxine (EUTHYROX) 75 MCG tablet Take 1 tablet (75 mcg total) by mouth daily before breakfast.   losartan-hydrochlorothiazide (HYZAAR) 100-25 MG tablet Take 1 tablet by mouth daily.   meloxicam (MOBIC) 15 MG tablet Take 1 tablet (15 mg total) by mouth daily. (ONLY ONCE DAILY if needed for joint/ back pain)   montelukast (SINGULAIR) 10 MG tablet Take 1 tablet (10 mg total) by mouth at bedtime.   omeprazole (PRILOSEC) 40 MG capsule Take 40 mg by mouth daily.   rosuvastatin (CRESTOR) 10 MG tablet Take 1 tablet (10 mg total) by mouth at bedtime. For cholesterol   Spacer/Aero-Holding Chambers (AEROCHAMBER PLUS) inhaler Use as instructed   [DISCONTINUED] doxepin (SINEQUAN) 10 MG capsule Take 10 mg by mouth at bedtime as needed. (Patient not taking:  Reported on 11/06/2022)   [DISCONTINUED] hydrOXYzine (ATARAX) 10 MG tablet Take 1 tablet (10 mg total) by mouth 3 (three) times daily as needed for itching. (Patient not taking: Reported on 11/06/2022)   [DISCONTINUED] linaclotide (LINZESS) 72 MCG capsule Take 72 mcg by mouth daily before breakfast. (Patient not taking: Reported on 11/06/2022)   [DISCONTINUED] potassium chloride SA (KLOR-CON M) 20 MEQ tablet Take 1 tablet (20 mEq total) by mouth daily. (Patient not taking: Reported on 11/06/2022)   [DISCONTINUED] sucralfate (CARAFATE) 1 GM/10ML suspension Take 1 g by  mouth 4 (four) times daily -  with meals and at bedtime. (Patient not taking: Reported on 11/06/2022)   [DISCONTINUED] Wheat Dextrin (BENEFIBER PO) Take by mouth. (Patient not taking: Reported on 11/06/2022)   No facility-administered encounter medications on file as of 11/06/2022.    Past Medical History:  Diagnosis Date   Allergy    Degenerative disc disease, lumbar    Hypertension    Thyroid disease    hypothyroidism   Ulcer     Past Surgical History:  Procedure Laterality Date   ABDOMINAL HYSTERECTOMY     ROTATOR CUFF REPAIR  11/30/2015   Right shoulder    Family History  Problem Relation Age of Onset   Arthritis Mother    Heart attack Father        18s MI   Heart disease Father    Allergic rhinitis Sister    Asthma Sister    Allergic rhinitis Sister    Allergic rhinitis Brother    Heart disease Brother    Cancer Brother        liver   Asthma Maternal Grandmother    Heart attack Maternal Grandmother    Breast cancer Maternal Grandmother    Healthy Daughter    Healthy Daughter    Healthy Daughter    Healthy Daughter    Healthy Daughter    Food Allergy Niece    Thyroid cancer Neg Hx    Colon cancer Neg Hx    Prostate cancer Neg Hx    Ovarian cancer Neg Hx    Angioedema Neg Hx    Eczema Neg Hx    Atopy Neg Hx    Immunodeficiency Neg Hx     Social History   Socioeconomic History   Marital status: Married    Spouse name: Fayrene Fearing   Number of children: 5   Years of education: 12   Highest education level: High school graduate  Occupational History    Employer: retired  Tobacco Use   Smoking status: Never    Passive exposure: Current (husband smokes 50+ years)   Smokeless tobacco: Never  Vaping Use   Vaping status: Never Used  Substance and Sexual Activity   Alcohol use: No   Drug use: No   Sexual activity: Not Currently    Birth control/protection: None  Other Topics Concern   Not on file  Social History Narrative   Lives with husband. Has 3  daughters and 2 step-daughters - all live within an hour away   Social Determinants of Health   Financial Resource Strain: Low Risk  (11/06/2022)   Overall Financial Resource Strain (CARDIA)    Difficulty of Paying Living Expenses: Not hard at all  Food Insecurity: No Food Insecurity (11/06/2022)   Hunger Vital Sign    Worried About Running Out of Food in the Last Year: Never true    Ran Out of Food in the Last Year: Never true  Transportation Needs: No Transportation  Needs (11/06/2022)   PRAPARE - Administrator, Civil Service (Medical): No    Lack of Transportation (Non-Medical): No  Physical Activity: Unknown (11/02/2022)   Exercise Vital Sign    Days of Exercise per Week: Patient declined    Minutes of Exercise per Session: 30 min  Stress: No Stress Concern Present (11/06/2022)   [employer] of Occupational Health - Occupational Stress Questionnaire    Feeling of Stress : Not at all  Social Connections: Socially Integrated (11/06/2022)   Social Connection and Isolation Panel [NHANES]    Frequency of Communication with Friends and Family: More than three times a week    Frequency of Social Gatherings with Friends and Family: More than three times a week    Attends Religious Services: More than 4 times per year    Active Member of [employer] or Organizations: Yes    Attends Banker Meetings: More than 4 times per year    Marital Status: Married  Catering manager Violence: Not At Risk (11/06/2022)   Humiliation, Afraid, Rape, and Kick questionnaire    Fear of Current or Ex-Partner: No    Emotionally Abused: No    Physically Abused: No    Sexually Abused: No    Review of Systems  Respiratory:  Positive for cough.   Musculoskeletal:  Positive for back pain.  All other systems reviewed and are negative.      Objective    BP 103/67   Pulse 66   Temp 97.7 F (36.5 C) (Oral)   Resp 18   Ht 5\' 3"  (1.6 m)   Wt 141 lb 4.8 oz (64.1 kg)   SpO2 97%   BMI  25.03 kg/m   Physical Exam Vitals and nursing note reviewed.  Constitutional:      Appearance: Normal appearance. She is normal weight.  HENT:     Head: Normocephalic and atraumatic.     Right Ear: External ear normal.     Left Ear: External ear normal.     Nose: Nose normal.     Mouth/Throat:     Mouth: Mucous membranes are moist.     Pharynx: Oropharynx is clear.  Eyes:     Conjunctiva/sclera: Conjunctivae normal.     Pupils: Pupils are equal, round, and reactive to light.  Cardiovascular:     Rate and Rhythm: Normal rate and regular rhythm.     Pulses: Normal pulses.     Heart sounds: Normal heart sounds.  Pulmonary:     Effort: Pulmonary effort is normal.     Breath sounds: Normal breath sounds.  Skin:    General: Skin is warm.     Capillary Refill: Capillary refill takes less than 2 seconds.  Neurological:     General: No focal deficit present.     Mental Status: She is alert and oriented to person, place, and time. Mental status is at baseline.  Psychiatric:        Mood and Affect: Mood normal.        Behavior: Behavior normal.        Thought Content: Thought content normal.        Judgment: Judgment normal.       Assessment & Plan:   Problem List Items Addressed This Visit   None Encounter to establish care with new doctor  Other allergic rhinitis  Chronic cough  Gastroesophageal reflux disease without esophagitis  Degeneration of intervertebral disc of lumbar region with discogenic back pain and lower  extremity pain   Spent most of the time today reviewing pt's medicines and her conditions. She admits to not being compliant with medication adherence in the treatment of these chronic conditions. Explained to pt the cough and her likely cause being allergic/asthma rhinitis along with GERD.  She will update me on her medications she is taking at home with the help of her daughter via mychart.  Total time spent with patient today 51 minutes. This includes  reviewing records, evaluating the patient and coordinating care. Face-to-face time >50%.   No follow-ups on file.   Suzan Slick, MD

## 2022-11-06 NOTE — Patient Instructions (Addendum)
Start back taking the Symbicort, Albuterol, Flonase, Xyzal and Singulair for seasonal allergies and will help with your cough.  Also take the Omeprazole which will help with your reflux symptoms and may also help your cough.

## 2022-11-07 ENCOUNTER — Other Ambulatory Visit: Payer: Self-pay | Admitting: Family Medicine

## 2023-01-01 ENCOUNTER — Other Ambulatory Visit: Payer: Self-pay | Admitting: Family Medicine

## 2023-01-02 ENCOUNTER — Other Ambulatory Visit: Payer: Self-pay | Admitting: Family Medicine

## 2023-01-02 DIAGNOSIS — R42 Dizziness and giddiness: Secondary | ICD-10-CM

## 2023-01-15 ENCOUNTER — Other Ambulatory Visit: Payer: Self-pay | Admitting: Family Medicine

## 2023-02-04 ENCOUNTER — Encounter: Payer: Self-pay | Admitting: Family Medicine

## 2023-02-04 ENCOUNTER — Ambulatory Visit (INDEPENDENT_AMBULATORY_CARE_PROVIDER_SITE_OTHER): Payer: Medicare Other | Admitting: Family Medicine

## 2023-02-04 VITALS — BP 100/67 | HR 66 | Temp 97.6°F | Resp 18 | Ht 63.0 in | Wt 148.6 lb

## 2023-02-04 DIAGNOSIS — R053 Chronic cough: Secondary | ICD-10-CM

## 2023-02-04 DIAGNOSIS — J3089 Other allergic rhinitis: Secondary | ICD-10-CM

## 2023-02-04 DIAGNOSIS — M1990 Unspecified osteoarthritis, unspecified site: Secondary | ICD-10-CM

## 2023-02-04 MED ORDER — CELECOXIB 100 MG PO CAPS: 100.0000 mg | ORAL_CAPSULE | Freq: Two times a day (BID) | ORAL | 0 refills | Status: DC

## 2023-02-04 MED ORDER — ALBUTEROL SULFATE HFA 108 (90 BASE) MCG/ACT IN AERS: 2.0000 | INHALATION_SPRAY | RESPIRATORY_TRACT | 1 refills | Status: AC | PRN

## 2023-02-04 NOTE — Progress Notes (Signed)
Established Patient Office Visit  Subjective   Patient ID: Diane Grant, female    DOB: Oct 02, 1950  Age: 72 y.o. MRN: [id number]  Chief Complaint  Patient presents with   Medical Management of Chronic Issues    Patient is here for a 4 month follow up Patient states that she does need refills on muscle relaxers   Arthritis    Patient has concerns about arthritis she states its getting worse    Arthritis  Arthritis Pt reports her arthritis is worsening.  She reports a hx of pain for the last 3 years. It is present in both hands, fingers, wrists, and her toes. She reports cramping pains. She has tried Mobic 15 mg along with OTC arthritis rubs. This doesn't help.  She has allergic asthma. She is using Symbicort and Albuterol inhaler prn. She needs the Albuterol refilled.    Review of Systems  Musculoskeletal:  Positive for arthritis and joint pain.  All other systems reviewed and are negative.    Objective:     There were no vitals taken for this visit. BP Readings from Last 3 Encounters:  02/04/23 100/67  11/06/22 103/67  09/17/22 120/66      Physical Exam Vitals and nursing note reviewed.  Constitutional:      Appearance: Normal appearance. She is normal weight.  HENT:     Head: Normocephalic and atraumatic.     Right Ear: External ear normal.     Left Ear: External ear normal.     Nose: Nose normal.     Mouth/Throat:     Mouth: Mucous membranes are moist.     Pharynx: Oropharynx is clear.  Eyes:     Conjunctiva/sclera: Conjunctivae normal.     Pupils: Pupils are equal, round, and reactive to light.  Cardiovascular:     Rate and Rhythm: Normal rate.  Pulmonary:     Effort: Pulmonary effort is normal.  Skin:    General: Skin is warm.     Capillary Refill: Capillary refill takes less than 2 seconds.  Neurological:     General: No focal deficit present.     Mental Status: She is alert and oriented to person, place, and time. Mental status is at  baseline.  Psychiatric:        Mood and Affect: Mood normal.        Behavior: Behavior normal.        Thought Content: Thought content normal.        Judgment: Judgment normal.    No results found for any visits on 02/04/23.     The ASCVD Risk score (Arnett DK, et al., 2019) failed to calculate for the following reasons:   The valid total cholesterol range is 130 to 320 mg/dL    Assessment & Plan:   Problem List Items Addressed This Visit   None Other allergic rhinitis -     Albuterol Sulfate HFA; Inhale 2 puffs into the lungs every 4 (four) hours as needed for wheezing or shortness of breath (coughing fits).  Dispense: 18 g; Refill: 1  Chronic cough -     Albuterol Sulfate HFA; Inhale 2 puffs into the lungs every 4 (four) hours as needed for wheezing or shortness of breath (coughing fits).  Dispense: 18 g; Refill: 1  Arthritis -     Celecoxib; Take 1 capsule (100 mg total) by mouth 2 (two) times daily.  Dispense: 60 capsule; Refill: 0   Refilled Albuterol inhaler to use prn for  allergic asthma. She also reports worsening arthritic pains. Will change Mobic 15mg  to Celebrex 100mg  BID. She may also use tylenol 650 mg TID prn in between if need be.   No follow-ups on file.    Suzan Slick, MD

## 2023-02-07 ENCOUNTER — Other Ambulatory Visit: Payer: Self-pay | Admitting: Family Medicine

## 2023-02-07 MED ORDER — METHOCARBAMOL 500 MG PO TABS: 500.0000 mg | ORAL_TABLET | Freq: Four times a day (QID) | ORAL | 0 refills | Status: AC

## 2023-02-07 NOTE — Telephone Encounter (Signed)
 Copied from CRM [id number]. Topic: Clinical - Medication Refill >> Feb 07, 2023 10:30 AM Antonio DEL wrote: Most Recent Primary Care Visit:  Provider: COLETTE TORRENCE GRADE  Department: [facility]  Visit Type: OFFICE VISIT  Date: 02/04/2023  Medication: ***  Has the patient contacted their pharmacy?  (Agent: If no, request that the patient contact the pharmacy for the refill. If patient does not wish to contact the pharmacy document the reason why and proceed with request.) (Agent: If yes, when and what did the pharmacy advise?)  Is this the correct pharmacy for this prescription?  If no, delete pharmacy and type the correct one.  This is the patient's preferred pharmacy:  [facility] [phone] - [place], [place] - [address] [place] [address] Phone: [phone] Fax: [phone]   Has the prescription been filled recently?   Is the patient out of the medication?   Has the patient been seen for an appointment in the last year OR does the patient have an upcoming appointment?   Can we respond through MyChart?   Agent: Please be advised that Rx refills may take up to 3 business days. We ask that you follow-up with your pharmacy.

## 2023-02-13 ENCOUNTER — Other Ambulatory Visit: Payer: Self-pay | Admitting: Family Medicine

## 2023-02-13 DIAGNOSIS — M1990 Unspecified osteoarthritis, unspecified site: Secondary | ICD-10-CM

## 2023-02-18 ENCOUNTER — Other Ambulatory Visit: Payer: Self-pay | Admitting: Family Medicine

## 2023-02-18 DIAGNOSIS — Z1231 Encounter for screening mammogram for malignant neoplasm of breast: Secondary | ICD-10-CM

## 2023-03-18 ENCOUNTER — Ambulatory Visit
Admission: RE | Admit: 2023-03-18 | Discharge: 2023-03-18 | Disposition: A | Discharge status: Home / Self Care | Payer: Medicare Other | Source: Ambulatory Visit | Attending: Family Medicine

## 2023-03-18 DIAGNOSIS — Z1231 Encounter for screening mammogram for malignant neoplasm of breast: Secondary | ICD-10-CM | POA: Diagnosis not present

## 2023-03-21 ENCOUNTER — Encounter: Payer: Self-pay | Admitting: Family Medicine

## 2023-03-22 ENCOUNTER — Ambulatory Visit (INDEPENDENT_AMBULATORY_CARE_PROVIDER_SITE_OTHER): Payer: Medicare Other | Admitting: Family Medicine

## 2023-03-22 ENCOUNTER — Encounter: Payer: Self-pay | Admitting: Family Medicine

## 2023-03-22 VITALS — BP 114/57 | HR 60 | Temp 97.7°F | Resp 18 | Ht 63.0 in | Wt 151.3 lb

## 2023-03-22 DIAGNOSIS — R053 Chronic cough: Secondary | ICD-10-CM | POA: Diagnosis not present

## 2023-03-22 DIAGNOSIS — M25511 Pain in right shoulder: Secondary | ICD-10-CM

## 2023-03-22 DIAGNOSIS — G8929 Other chronic pain: Secondary | ICD-10-CM | POA: Diagnosis not present

## 2023-03-22 DIAGNOSIS — K219 Gastro-esophageal reflux disease without esophagitis: Secondary | ICD-10-CM

## 2023-03-22 DIAGNOSIS — J3089 Other allergic rhinitis: Secondary | ICD-10-CM | POA: Diagnosis not present

## 2023-03-22 DIAGNOSIS — M25512 Pain in left shoulder: Secondary | ICD-10-CM | POA: Diagnosis not present

## 2023-03-22 MED ORDER — OMEPRAZOLE 40 MG PO CPDR: 40.0000 mg | DELAYED_RELEASE_CAPSULE | Freq: Every day | ORAL | 1 refills | Status: DC

## 2023-03-22 MED ORDER — BUDESONIDE-FORMOTEROL FUMARATE 80-4.5 MCG/ACT IN AERO: 2.0000 | INHALATION_SPRAY | Freq: Two times a day (BID) | RESPIRATORY_TRACT | 11 refills | Status: DC

## 2023-03-22 NOTE — Patient Instructions (Signed)
May take Tylenol arthritis with the Celebrex

## 2023-03-22 NOTE — Progress Notes (Signed)
Established Patient Office Visit  Subjective   Patient ID: Diane Grant, female    DOB: [birth year]  Age: 73 y.o. MRN: [id number]  Chief Complaint  Patient presents with   Cough    Patient states that she is having issues with a reoccurring cough she has had for years    Cough Associated symptoms include wheezing.   Cough Pt with hx of restrictive lung disease along with seasonal allergies to dust mites and shellfish. She was seen by allergist a year ago. Given inhalers and Singulair to use. Pt reports she's only using Albuterol inhaler prn and taking the Singulair 10mg  at night. She reports her cough is sometimes accompanied by wheezing. Daughter states she is exposed to second hand smoke and wondered if she should have a CT Scan. Pt is non-smoker. Pt reports she doesn't have the Symbicort that was prescribed to her last year.   GERD Pt also has hx of GERD. When asked if she has heartburn symptoms, pt reports yes. She isn't taking anything for this. She use to take Omeprazole 40mg  but no longer has this at home.   Shoulder pain Pt reports worsening shoulder pain despite Celebrex 100mg  BID usage. She says her left shoulder is worse than her right shoulder.    Review of Systems  HENT:  Positive for congestion.   Respiratory:  Positive for cough and wheezing.   Musculoskeletal:  Positive for joint pain.  All other systems reviewed and are negative.    Objective:     BP (!) 114/57   Pulse 60   Temp 97.7 F (36.5 C) (Oral)   Resp 18   Ht 5\' 3"  (1.6 m)   Wt 151 lb 4.8 oz (68.6 kg)   SpO2 97%   BMI 26.80 kg/m  BP Readings from Last 3 Encounters:  03/22/23 (!) 114/57  02/04/23 100/67  11/06/22 103/67      Physical Exam Vitals and nursing note reviewed.  Constitutional:      Appearance: Normal appearance. She is normal weight.  HENT:     Head: Normocephalic and atraumatic.     Right Ear: External ear normal.     Left Ear: External ear normal.      Nose: Nose normal.     Mouth/Throat:     Mouth: Mucous membranes are moist.     Pharynx: Oropharynx is clear.  Eyes:     Conjunctiva/sclera: Conjunctivae normal.     Pupils: Pupils are equal, round, and reactive to light.  Cardiovascular:     Rate and Rhythm: Normal rate and regular rhythm.     Pulses: Normal pulses.     Heart sounds: Normal heart sounds.  Pulmonary:     Effort: Pulmonary effort is normal.     Breath sounds: Normal breath sounds.  Abdominal:     General: Abdomen is flat. Bowel sounds are normal.  Skin:    General: Skin is warm.     Capillary Refill: Capillary refill takes less than 2 seconds.  Neurological:     General: No focal deficit present.     Mental Status: She is alert and oriented to person, place, and time. Mental status is at baseline.  Psychiatric:        Mood and Affect: Mood normal.        Behavior: Behavior normal.        Thought Content: Thought content normal.        Judgment: Judgment normal.    No results found  for any visits on 03/22/23.     The ASCVD Risk score (Arnett DK, et al., 2019) failed to calculate for the following reasons:   The valid total cholesterol range is 130 to 320 mg/dL    Assessment & Plan:   Problem List Items Addressed This Visit   None  Chronic cough -     DG Chest 2 View; Future -     Budesonide-Formoterol Fumarate; Inhale 2 puffs into the lungs 2 (two) times daily.  Dispense: 1 each; Refill: 11  Other allergic rhinitis -     Budesonide-Formoterol Fumarate; Inhale 2 puffs into the lungs 2 (two) times daily.  Dispense: 1 each; Refill: 11  Gastroesophageal reflux disease without esophagitis -     Omeprazole; Take 1 capsule (40 mg total) by mouth daily.  Dispense: 90 capsule; Refill: 1  Chronic pain of both shoulders -     Ambulatory referral to Orthopedic Surgery   Pt with chronic cough with hx of restrictive lung disease and allergic rhinitis. Advised to continue the Singulair 10mg  and Albuterol inhaler  prn. Will refill the Symbicort to use 2 puffs bid.  Counseled pt that GERD can also cause chronic cough. Will refill Omeprazole 40mg  daily to use.  Has follow up appt next month. To refer to Ortho for management of shoulder pains.  No follow-ups on file.    Suzan Slick, MD

## 2023-03-25 ENCOUNTER — Ambulatory Visit: Payer: Medicare Other | Admitting: Family Medicine

## 2023-04-03 ENCOUNTER — Ambulatory Visit (HOSPITAL_BASED_OUTPATIENT_CLINIC_OR_DEPARTMENT_OTHER)
Admission: RE | Admit: 2023-04-03 | Discharge: 2023-04-03 | Disposition: A | Discharge status: Home / Self Care | Payer: Medicare Other | Source: Ambulatory Visit | Attending: Family Medicine | Admitting: Family Medicine

## 2023-04-03 ENCOUNTER — Ambulatory Visit (HOSPITAL_BASED_OUTPATIENT_CLINIC_OR_DEPARTMENT_OTHER): Payer: Medicare Other | Admitting: Orthopaedic Surgery

## 2023-04-03 ENCOUNTER — Ambulatory Visit (HOSPITAL_BASED_OUTPATIENT_CLINIC_OR_DEPARTMENT_OTHER): Payer: Medicare Other

## 2023-04-03 DIAGNOSIS — R053 Chronic cough: Secondary | ICD-10-CM

## 2023-04-03 DIAGNOSIS — M25512 Pain in left shoulder: Secondary | ICD-10-CM | POA: Diagnosis not present

## 2023-04-03 DIAGNOSIS — M778 Other enthesopathies, not elsewhere classified: Secondary | ICD-10-CM | POA: Diagnosis not present

## 2023-04-03 DIAGNOSIS — G8929 Other chronic pain: Secondary | ICD-10-CM

## 2023-04-03 DIAGNOSIS — M25511 Pain in right shoulder: Secondary | ICD-10-CM | POA: Diagnosis not present

## 2023-04-03 DIAGNOSIS — M19011 Primary osteoarthritis, right shoulder: Secondary | ICD-10-CM | POA: Diagnosis not present

## 2023-04-03 DIAGNOSIS — R918 Other nonspecific abnormal finding of lung field: Secondary | ICD-10-CM | POA: Diagnosis not present

## 2023-04-03 DIAGNOSIS — M19012 Primary osteoarthritis, left shoulder: Secondary | ICD-10-CM | POA: Diagnosis not present

## 2023-04-03 MED ORDER — LIDOCAINE HCL 1 % IJ SOLN
4.0000 mL | INTRAMUSCULAR | Status: AC | PRN
  Administered 2023-04-03: 4 mL

## 2023-04-03 MED ORDER — TRIAMCINOLONE ACETONIDE 40 MG/ML IJ SUSP
80.0000 mg | INTRAMUSCULAR | Status: AC | PRN
  Administered 2023-04-03: 80 mg via INTRA_ARTICULAR

## 2023-04-03 NOTE — Progress Notes (Signed)
 Chief Complaint: Bilateral shoulder pain     History of Present Illness:    Diane Grant is a 73 y.o. female presents today with a history of bilateral shoulder pain.  She has previously had a rotator cuff repair done on the right side many years prior.  This was done in [facility].  She did previously have injections following this.  Since that time she has had recurrence of her pain particular with overhead activity.  She is right-hand dominant.  She does enjoy gardening.  She states the left shoulder does feel quite similar to the contralateral shoulder.    PMH/PSH/Family History/Social History/Meds/Allergies:    Past Medical History:  Diagnosis Date  . Allergy   . Degenerative disc disease, lumbar   . Hypertension   . Thyroid disease    hypothyroidism  . Ulcer    Past Surgical History:  Procedure Laterality Date  . ABDOMINAL HYSTERECTOMY    . ROTATOR CUFF REPAIR  11/30/2015   Right shoulder   Social History   Socioeconomic History  . Marital status: Married    Spouse name: Fayrene Fearing  . Number of children: 5  . Years of education: 28  . Highest education level: 12th grade  Occupational History    Employer: retired  Tobacco Use  . Smoking status: Never    Passive exposure: Current (husband smokes 50+ years)  . Smokeless tobacco: Never  Vaping Use  . Vaping status: Never Used  Substance and Sexual Activity  . Alcohol use: No  . Drug use: No  . Sexual activity: Not Currently    Birth control/protection: None  Other Topics Concern  . Not on file  Social History Narrative   Lives with husband. Has 3 daughters and 2 step-daughters - all live within an hour away   Social Drivers of Health   Financial Resource Strain: Patient Declined (02/03/2023)   Overall Financial Resource Strain (CARDIA)   . Difficulty of Paying Living Expenses: Patient declined  Food Insecurity: Patient Declined (02/03/2023)   Hunger Vital Sign   . Worried About  Programme researcher, broadcasting/film/video in the Last Year: Patient declined   . Ran Out of Food in the Last Year: Patient declined  Transportation Needs: Patient Declined (02/03/2023)   PRAPARE - Transportation   . Lack of Transportation (Medical): Patient declined   . Lack of Transportation (Non-Medical): Patient declined  Physical Activity: Unknown (02/03/2023)   Exercise Vital Sign   . Days of Exercise per Week: Patient declined   . Minutes of Exercise per Session: 30 min  Stress: Patient Declined (02/03/2023)   [employer] of Occupational Health - Occupational Stress Questionnaire   . Feeling of Stress : Patient declined  Social Connections: Unknown (02/03/2023)   Social Connection and Isolation Panel [NHANES]   . Frequency of Communication with Friends and Family: Patient declined   . Frequency of Social Gatherings with Friends and Family: Patient declined   . Attends Religious Services: Patient declined   . Active Member of Clubs or Organizations: Patient declined   . Attends Banker Meetings: More than 4 times per year   . Marital Status: Patient declined   Family History  Problem Relation Age of Onset  . Arthritis Mother   . Heart attack Father        64s MI  . Heart disease Father   . Allergic rhinitis Sister   . Asthma Sister   . Allergic rhinitis Sister   . Allergic rhinitis Brother   .  Heart disease Brother   . Cancer Brother        liver  . Asthma Maternal Grandmother   . Heart attack Maternal Grandmother   . Breast cancer Maternal Grandmother   . Healthy Daughter   . Healthy Daughter   . Healthy Daughter   . Healthy Daughter   . Healthy Daughter   . Food Allergy Niece   . Thyroid cancer Neg Hx   . Colon cancer Neg Hx   . Prostate cancer Neg Hx   . Ovarian cancer Neg Hx   . Angioedema Neg Hx   . Eczema Neg Hx   . Atopy Neg Hx   . Immunodeficiency Neg Hx    Allergies  Allergen Reactions  . Anesthesia S-I-40 [Propofol] Nausea And Vomiting    Per  patient need to be careful when giving this   Current Outpatient Medications  Medication Sig Dispense Refill  . albuterol (VENTOLIN HFA) 108 (90 Base) MCG/ACT inhaler Inhale 2 puffs into the lungs every 4 (four) hours as needed for wheezing or shortness of breath (coughing fits). 18 g 1  . budesonide-formoterol (SYMBICORT) 80-4.5 MCG/ACT inhaler Inhale 2 puffs into the lungs 2 (two) times daily. 1 each 11  . celecoxib (CELEBREX) 100 MG capsule Take 1 capsule (100 mg total) by mouth 2 (two) times daily. 60 capsule 0  . Cholecalciferol (VITAMIN D-3 PO) Take by mouth.    . dicyclomine (BENTYL) 20 MG tablet Take 20 mg by mouth 3 (three) times daily.    Marland Kitchen doxepin (SINEQUAN) 10 MG capsule Take 10 mg by mouth at bedtime as needed.    . fluticasone (FLONASE) 50 MCG/ACT nasal spray Place 2 sprays into both nostrils daily. 16 g 6  . hydrOXYzine (ATARAX) 10 MG tablet Take 10 mg by mouth 3 (three) times daily as needed for itching.    . levothyroxine (EUTHYROX) 75 MCG tablet Take 1 tablet (75 mcg total) by mouth daily before breakfast. 100 tablet 3  . linaclotide (LINZESS) 72 MCG capsule Take 72 mcg by mouth daily before breakfast.    . losartan-hydrochlorothiazide (HYZAAR) 100-25 MG tablet Take 1 tablet by mouth daily. 90 tablet 3  . meclizine (ANTIVERT) 25 MG tablet Take 25 mg by mouth 3 (three) times daily as needed for dizziness.    . methocarbamol (ROBAXIN) 500 MG tablet Take 1 tablet (500 mg total) by mouth 4 (four) times daily. 40 tablet 0  . montelukast (SINGULAIR) 10 MG tablet Take 1 tablet (10 mg total) by mouth at bedtime. 90 tablet 3  . omeprazole (PRILOSEC) 40 MG capsule Take 1 capsule (40 mg total) by mouth daily. 90 capsule 1  . rosuvastatin (CRESTOR) 10 MG tablet Take 1 tablet (10 mg total) by mouth at bedtime. For cholesterol 90 tablet 3  . Spacer/Aero-Holding Chambers (AEROCHAMBER PLUS) inhaler Use as instructed 1 each 2   No current facility-administered medications for this visit.    No results found.  Review of Systems:   A ROS was performed including pertinent positives and negatives as documented in the HPI.  Physical Exam :   Constitutional: NAD and appears stated age Neurological: Alert and oriented Psych: Appropriate affect and cooperative There were no vitals taken for this visit.   Comprehensive Musculoskeletal Exam:    Tenderness about bilateral glenohumeral joints and lateral deltoids.  Range of motion is 160 degrees bilaterally with external rotation at side 30 degrees bilaterally.  Internal rotation is L5 bilaterally..  Negative belly press, 4 out of 5  with forward elevation.  Positive Neer impingement   Imaging:   Xray (3 views right shoulder, 3 views left shoulder): Mild osteoarthritis right shoulder with narrowing of the acromiohumeral interval     I personally reviewed and interpreted the radiographs.   Assessment and Plan:   73 y.o. female with evidence of early right shoulder rotator cuff arthropathy in the setting of previous rotator cuff repair.  I did describe that overall I would anticipate that she has had an additional left shoulder rotator cuff tear due to her history.  She has had previous injections.  At today's visit I did discuss I would like to obtain an MRI of the left shoulder as this 1 is bothering her more she has become more dependent on it as result of the right shoulder.  She is elected for right shoulder subacromial injection after verbal consent was obtained  -Plan for MRI left shoulder and follow-up discuss results    Procedure Note  Patient: Diane Grant             Date of Birth: 1950/10/18           MRN: [id number]             Visit Date: 04/03/2023  Procedures: Visit Diagnoses:  1. Chronic left shoulder pain   2. Chronic right shoulder pain     Large Joint Inj: R subacromial bursa on 04/03/2023 12:17 PM Indications: pain Details: 22 G 1.5 in needle, ultrasound-guided anterior  approach  Arthrogram: No  Medications: 4 mL lidocaine 1 %; 80 mg triamcinolone acetonide 40 MG/ML Outcome: tolerated well, no immediate complications Procedure, treatment alternatives, risks and benefits explained, specific risks discussed. Consent was given by the patient. Immediately prior to procedure a time out was called to verify the correct patient, procedure, equipment, support staff and site/side marked as required. Patient was prepped and draped in the usual sterile fashion.         I personally saw and evaluated the patient, and participated in the management and treatment plan.  Huel Cote, MD Attending Physician, Orthopedic Surgery  This document was dictated using Dragon voice recognition software. A reasonable attempt at proof reading has been made to minimize errors.

## 2023-04-12 ENCOUNTER — Ambulatory Visit
Admission: RE | Admit: 2023-04-12 | Discharge: 2023-04-12 | Disposition: A | Discharge status: Home / Self Care | Payer: Medicare Other | Source: Ambulatory Visit | Attending: Orthopaedic Surgery | Admitting: Orthopaedic Surgery

## 2023-04-12 ENCOUNTER — Other Ambulatory Visit: Payer: Medicare Other

## 2023-04-12 ENCOUNTER — Other Ambulatory Visit: Payer: Self-pay | Admitting: Family Medicine

## 2023-04-12 DIAGNOSIS — M25512 Pain in left shoulder: Secondary | ICD-10-CM | POA: Diagnosis not present

## 2023-04-12 DIAGNOSIS — G8929 Other chronic pain: Secondary | ICD-10-CM

## 2023-04-12 DIAGNOSIS — M7582 Other shoulder lesions, left shoulder: Secondary | ICD-10-CM | POA: Diagnosis not present

## 2023-04-12 DIAGNOSIS — E7800 Pure hypercholesterolemia, unspecified: Secondary | ICD-10-CM

## 2023-04-12 DIAGNOSIS — M1990 Unspecified osteoarthritis, unspecified site: Secondary | ICD-10-CM

## 2023-04-12 DIAGNOSIS — M75112 Incomplete rotator cuff tear or rupture of left shoulder, not specified as traumatic: Secondary | ICD-10-CM | POA: Diagnosis not present

## 2023-04-19 ENCOUNTER — Encounter: Payer: Self-pay | Admitting: Family Medicine

## 2023-04-19 DIAGNOSIS — Z789 Other specified health status: Secondary | ICD-10-CM

## 2023-04-19 DIAGNOSIS — J439 Emphysema, unspecified: Secondary | ICD-10-CM

## 2023-04-19 NOTE — Telephone Encounter (Signed)
 Copied from CRM [id number]. Topic: Referral - Question >> Apr 19, 2023 10:52 AM Antwanette L wrote: Reason for CRM: The patient daughter Larita Fife) is calling to see if Dr. Wyline Mood can send out a new pulmonary referral? The referral that was sent, the provider is going on maternity leave and she is not taking new patients. Please send a new referral to [facility] at [address] [place], [place] [address] phone number [phone] fax number : [phone]. Please tell Dr. Wyline Mood this is a  urgent request and the patient needs to be seen as soon as possible. Larita Fife can be contacted at [phone]

## 2023-04-19 NOTE — Addendum Note (Signed)
 Addended by: Suzan Slick on: 04/19/2023 11:43 AM   Modules accepted: Orders

## 2023-04-19 NOTE — Telephone Encounter (Signed)
 New referral placed at [facility]. Please send referrals with xray of chest. Pt changed previous referral since the provider will be out.

## 2023-04-24 DIAGNOSIS — R053 Chronic cough: Secondary | ICD-10-CM | POA: Diagnosis not present

## 2023-04-24 DIAGNOSIS — J4540 Moderate persistent asthma, uncomplicated: Secondary | ICD-10-CM | POA: Insufficient documentation

## 2023-04-24 DIAGNOSIS — Z91018 Allergy to other foods: Secondary | ICD-10-CM | POA: Insufficient documentation

## 2023-04-24 DIAGNOSIS — R058 Other specified cough: Secondary | ICD-10-CM | POA: Diagnosis not present

## 2023-04-24 DIAGNOSIS — K219 Gastro-esophageal reflux disease without esophagitis: Secondary | ICD-10-CM | POA: Diagnosis not present

## 2023-04-25 ENCOUNTER — Ambulatory Visit (HOSPITAL_BASED_OUTPATIENT_CLINIC_OR_DEPARTMENT_OTHER): Admitting: Orthopaedic Surgery

## 2023-04-26 ENCOUNTER — Other Ambulatory Visit (HOSPITAL_BASED_OUTPATIENT_CLINIC_OR_DEPARTMENT_OTHER): Payer: Self-pay

## 2023-04-26 ENCOUNTER — Ambulatory Visit (HOSPITAL_BASED_OUTPATIENT_CLINIC_OR_DEPARTMENT_OTHER): Admitting: Orthopaedic Surgery

## 2023-04-26 ENCOUNTER — Ambulatory Visit (HOSPITAL_BASED_OUTPATIENT_CLINIC_OR_DEPARTMENT_OTHER): Payer: Self-pay | Admitting: Orthopaedic Surgery

## 2023-04-26 DIAGNOSIS — M75122 Complete rotator cuff tear or rupture of left shoulder, not specified as traumatic: Secondary | ICD-10-CM

## 2023-04-26 MED ORDER — OXYCODONE HCL 5 MG PO TABS
5.0000 mg | ORAL_TABLET | ORAL | 0 refills | Status: AC | PRN
Start: 2023-04-26 — End: ?

## 2023-04-26 MED ORDER — ACETAMINOPHEN 500 MG PO TABS: 500.0000 mg | ORAL_TABLET | Freq: Three times a day (TID) | ORAL | 0 refills | Status: AC

## 2023-04-26 MED ORDER — ASPIRIN 325 MG PO TBEC: 325.0000 mg | DELAYED_RELEASE_TABLET | Freq: Every day | ORAL | 0 refills | Status: DC

## 2023-04-26 NOTE — Progress Notes (Signed)
 Chief Complaint: Bilateral shoulder pain     History of Present Illness:   04/26/2023: Presents today for follow-up of her left shoulder as well as MRI discussion  Diane Grant is a 73 y.o. female presents today with a history of bilateral shoulder pain.  She has previously had a rotator cuff repair done on the right side many years prior.  This was done in [facility].  She did previously have injections following this.  Since that time she has had recurrence of her pain particular with overhead activity.  She is right-hand dominant.  She does enjoy gardening.  She states the left shoulder does feel quite similar to the contralateral shoulder.    PMH/PSH/Family History/Social History/Meds/Allergies:    Past Medical History:  Diagnosis Date   Allergy    Degenerative disc disease, lumbar    Hypertension    Thyroid disease    hypothyroidism   Ulcer    Past Surgical History:  Procedure Laterality Date   ABDOMINAL HYSTERECTOMY     ROTATOR CUFF REPAIR  11/30/2015   Right shoulder   Social History   Socioeconomic History   Marital status: Married    Spouse name: Fayrene Fearing   Number of children: 5   Years of education: 12   Highest education level: 12th grade  Occupational History    Employer: retired  Tobacco Use   Smoking status: Never    Passive exposure: Current (husband smokes 50+ years)   Smokeless tobacco: Never  Vaping Use   Vaping status: Never Used  Substance and Sexual Activity   Alcohol use: No   Drug use: No   Sexual activity: Not Currently    Birth control/protection: None  Other Topics Concern   Not on file  Social History Narrative   Lives with husband. Has 3 daughters and 2 step-daughters - all live within an hour away   Social Drivers of Health   Financial Resource Strain: Patient Declined (02/03/2023)   Overall Financial Resource Strain (CARDIA)    Difficulty of Paying Living Expenses: Patient declined  Food Insecurity:  Low Risk  (04/24/2023)   Received from [facility]   Hunger Vital Sign    Worried About Running Out of Food in the Last Year: Never true    Ran Out of Food in the Last Year: Never true  Transportation Needs: No Transportation Needs (04/24/2023)   Received from [facility]    In the past 12 months, has lack of reliable transportation kept you from medical appointments, meetings, work or from getting things needed for daily living? : No  Physical Activity: Unknown (02/03/2023)   Exercise Vital Sign    Days of Exercise per Week: Patient declined    Minutes of Exercise per Session: 30 min  Stress: Patient Declined (02/03/2023)   [employer] of Occupational Health - Occupational Stress Questionnaire    Feeling of Stress : Patient declined  Social Connections: Unknown (02/03/2023)   Social Connection and Isolation Panel [NHANES]    Frequency of Communication with Friends and Family: Patient declined    Frequency of Social Gatherings with Friends and Family: Patient declined    Attends Religious Services: Patient declined    Database administrator or Organizations: Patient declined    Attends Engineer, structural: More than 4 times per year    Marital Status: Patient declined   Family History  Problem Relation Age of Onset   Arthritis Mother    Heart attack  Father        55s MI   Heart disease Father    Allergic rhinitis Sister    Asthma Sister    Allergic rhinitis Sister    Allergic rhinitis Brother    Heart disease Brother    Cancer Brother        liver   Asthma Maternal Grandmother    Heart attack Maternal Grandmother    Breast cancer Maternal Grandmother    Healthy Daughter    Healthy Daughter    Healthy Daughter    Healthy Daughter    Healthy Daughter    Food Allergy Niece    Thyroid cancer Neg Hx    Colon cancer Neg Hx    Prostate cancer Neg Hx    Ovarian cancer Neg Hx    Angioedema Neg Hx    Eczema Neg Hx    Atopy Neg Hx     Immunodeficiency Neg Hx    Allergies  Allergen Reactions   Anesthesia S-I-40 [Propofol] Nausea And Vomiting    Per patient need to be careful when giving this   Current Outpatient Medications  Medication Sig Dispense Refill   albuterol (VENTOLIN HFA) 108 (90 Base) MCG/ACT inhaler Inhale 2 puffs into the lungs every 4 (four) hours as needed for wheezing or shortness of breath (coughing fits). 18 g 1   budesonide-formoterol (SYMBICORT) 80-4.5 MCG/ACT inhaler Inhale 2 puffs into the lungs 2 (two) times daily. 1 each 11   celecoxib (CELEBREX) 100 MG capsule TAKE 1 CAPSULE BY MOUTH TWICE A DAY 60 capsule 0   Cholecalciferol (VITAMIN D-3 PO) Take by mouth.     dicyclomine (BENTYL) 20 MG tablet Take 20 mg by mouth 3 (three) times daily.     doxepin (SINEQUAN) 10 MG capsule Take 10 mg by mouth at bedtime as needed.     fluticasone (FLONASE) 50 MCG/ACT nasal spray Place 2 sprays into both nostrils daily. 16 g 6   hydrOXYzine (ATARAX) 10 MG tablet Take 10 mg by mouth 3 (three) times daily as needed for itching.     levothyroxine (EUTHYROX) 75 MCG tablet Take 1 tablet (75 mcg total) by mouth daily before breakfast. 100 tablet 3   linaclotide (LINZESS) 72 MCG capsule Take 72 mcg by mouth daily before breakfast.     losartan-hydrochlorothiazide (HYZAAR) 100-25 MG tablet Take 1 tablet by mouth daily. 90 tablet 3   meclizine (ANTIVERT) 25 MG tablet Take 25 mg by mouth 3 (three) times daily as needed for dizziness.     methocarbamol (ROBAXIN) 500 MG tablet Take 1 tablet (500 mg total) by mouth 4 (four) times daily. 40 tablet 0   montelukast (SINGULAIR) 10 MG tablet Take 1 tablet (10 mg total) by mouth at bedtime. 90 tablet 3   omeprazole (PRILOSEC) 40 MG capsule Take 1 capsule (40 mg total) by mouth daily. 90 capsule 1   rosuvastatin (CRESTOR) 10 MG tablet Take 1 tablet (10 mg total) by mouth at bedtime. For cholesterol 90 tablet 3   Spacer/Aero-Holding Chambers (AEROCHAMBER PLUS) inhaler Use as instructed  1 each 2   No current facility-administered medications for this visit.   No results found.  Review of Systems:   A ROS was performed including pertinent positives and negatives as documented in the HPI.  Physical Exam :   Constitutional: NAD and appears stated age Neurological: Alert and oriented Psych: Appropriate affect and cooperative There were no vitals taken for this visit.   Comprehensive Musculoskeletal Exam:    Tenderness  about bilateral glenohumeral joints and lateral deltoids.  Range of motion is 160 degrees bilaterally with external rotation at side 30 degrees bilaterally.  Internal rotation is L5 bilaterally..  Negative belly press, 4 out of 5 with forward elevation.  Positive Neer impingement   Imaging:   Xray (3 views right shoulder, 3 views left shoulder): Mild osteoarthritis right shoulder with narrowing of the acromiohumeral interval   MRI left shoulder: Nearly full-thickness rotator cuff injury with chondral thinning consistent with glenohumeral osteoarthritis  I personally reviewed and interpreted the radiographs.   Assessment and Plan:   73 y.o. female with left shoulder rotator cuff tearing in the setting of glenohumeral osteoarthritis.  At this time she has had a previous injection which only gave her 3 days of relief.  As result she is hoping to defer this.  Time she has trialed strengthening of the left shoulder without any improvement.  Given this I did discuss the possibility of reverse shoulder arthroplasty on the left.  I did discuss the risks and limitations as well as the associated complications.  After discussion she has elected for this  -Plan for left shoulder reverse shoulder arthroplasty    After a lengthy discussion of treatment options, including risks, benefits, alternatives, complications of surgical and nonsurgical conservative options, the patient elected surgical repair.   The patient  is aware of the material risks  and  complications including, but not limited to injury to adjacent structures, neurovascular injury, infection, numbness, bleeding, implant failure, thermal burns, stiffness, persistent pain, failure to heal, disease transmission from allograft, need for further surgery, dislocation, anesthetic risks, blood clots, risks of death,and others. The probabilities of surgical success and failure discussed with patient given their particular co-morbidities.The time and nature of expected rehabilitation and recovery was discussed.The patient's questions were all answered preoperatively.  No barriers to understanding were noted. I explained the natural history of the disease process and Rx rationale.  I explained to the patient what I considered to be reasonable expectations given their personal situation.  The final treatment plan was arrived at through a shared patient decision making process model.      I personally saw and evaluated the patient, and participated in the management and treatment plan.  Huel Cote, MD Attending Physician, Orthopedic Surgery  This document was dictated using Dragon voice recognition software. A reasonable attempt at proof reading has been made to minimize errors.

## 2023-04-29 ENCOUNTER — Institutional Professional Consult (permissible substitution): Admitting: Pulmonary Disease

## 2023-05-01 ENCOUNTER — Ambulatory Visit (HOSPITAL_BASED_OUTPATIENT_CLINIC_OR_DEPARTMENT_OTHER): Payer: Medicare Other | Admitting: Orthopaedic Surgery

## 2023-05-02 ENCOUNTER — Ambulatory Visit
Admission: RE | Admit: 2023-05-02 | Discharge: 2023-05-02 | Disposition: A | Discharge status: Home / Self Care | Source: Ambulatory Visit | Attending: Orthopaedic Surgery | Admitting: Orthopaedic Surgery

## 2023-05-02 DIAGNOSIS — M75122 Complete rotator cuff tear or rupture of left shoulder, not specified as traumatic: Secondary | ICD-10-CM

## 2023-05-02 DIAGNOSIS — M19012 Primary osteoarthritis, left shoulder: Secondary | ICD-10-CM | POA: Diagnosis not present

## 2023-05-06 ENCOUNTER — Ambulatory Visit (INDEPENDENT_AMBULATORY_CARE_PROVIDER_SITE_OTHER): Payer: Medicare Other | Admitting: Family Medicine

## 2023-05-06 ENCOUNTER — Encounter: Payer: Self-pay | Admitting: Family Medicine

## 2023-05-06 VITALS — BP 109/70 | HR 66 | Temp 97.7°F | Resp 18 | Ht 63.0 in | Wt 146.4 lb

## 2023-05-06 DIAGNOSIS — Z0000 Encounter for general adult medical examination without abnormal findings: Secondary | ICD-10-CM | POA: Diagnosis not present

## 2023-05-06 DIAGNOSIS — R7302 Impaired glucose tolerance (oral): Secondary | ICD-10-CM | POA: Diagnosis not present

## 2023-05-06 DIAGNOSIS — Z136 Encounter for screening for cardiovascular disorders: Secondary | ICD-10-CM | POA: Diagnosis not present

## 2023-05-06 DIAGNOSIS — Z13220 Encounter for screening for lipoid disorders: Secondary | ICD-10-CM | POA: Diagnosis not present

## 2023-05-06 NOTE — Progress Notes (Signed)
 Complete physical exam  Patient: Diane Grant   DOB: 08/14/1950   73 y.o. Female  MRN: [id number]  Subjective:    Chief Complaint  Patient presents with   Annual Exam    Patient is here for annual physical. Patient is fasting     Diane Grant is a 74 y.o. female who presents today for a complete physical exam. She reports consuming a general diet.  Walking at home  She generally feels well. She reports sleeping well. She does not have additional problems to discuss today.    Most recent fall risk assessment:    11/06/2022    2:17 PM  Fall Risk   Falls in the past year? 0  Number falls in past yr: 0  Injury with Fall? 0  Risk for fall due to : No Fall Risks  Follow up Falls evaluation completed     Most recent depression screenings:    11/06/2022    2:23 PM 09/17/2022   10:10 AM  PHQ 2/9 Scores  PHQ - 2 Score 0 0  PHQ- 9 Score 0 2    Vision:Not within last year   Patient Active Problem List   Diagnosis Date Noted   Moderate persistent asthma without complication 04/24/2023   Laryngopharyngeal reflux (LPR) 04/24/2023   Allergy to alpha-gal 04/24/2023   Second hand tobacco smoke exposure 06/15/2021   Other allergic rhinitis 06/15/2021   Other adverse food reactions, not elsewhere classified, subsequent encounter 06/15/2021   Irritable bowel syndrome with constipation 11/16/2020   Cough 03/29/2020   Acute pharyngitis 03/29/2020   Urticaria 11/27/2019   Palpitations 06/16/2018   Chest pain of uncertain etiology 06/16/2018   Shortness of breath 06/16/2018   Educated about COVID-19 virus infection 06/16/2018   Change in voice 03/08/2017   Degenerative disc disease, lumbar 03/08/2017   Hypothyroidism 06/26/2013   Essential hypertension 06/26/2013   Past Medical History:  Diagnosis Date   Allergy    Degenerative disc disease, lumbar    Hypertension    Thyroid disease    hypothyroidism   Ulcer    Past Surgical History:  Procedure  Laterality Date   ABDOMINAL HYSTERECTOMY     ROTATOR CUFF REPAIR  11/30/2015   Right shoulder   Social History   Socioeconomic History   Marital status: Married    Spouse name: Fayrene Fearing   Number of children: 5   Years of education: 12   Highest education level: 12th grade  Occupational History    Employer: retired  Tobacco Use   Smoking status: Never    Passive exposure: Current (husband smokes 50+ years)   Smokeless tobacco: Never  Vaping Use   Vaping status: Never Used  Substance and Sexual Activity   Alcohol use: No   Drug use: No   Sexual activity: Not Currently    Birth control/protection: None  Other Topics Concern   Not on file  Social History Narrative   Lives with husband. Has 3 daughters and 2 step-daughters - all live within an hour away   Social Drivers of Health   Financial Resource Strain: Patient Declined (02/03/2023)   Overall Financial Resource Strain (CARDIA)    Difficulty of Paying Living Expenses: Patient declined  Food Insecurity: Low Risk  (04/24/2023)   Received from [facility]   Hunger Vital Sign    Worried About Running Out of Food in the Last Year: Never true    Ran Out of Food in the Last Year: Never true  Transportation Needs: No Transportation Needs (04/24/2023)   Received from [facility]   Transportation    In the past 12 months, has lack of reliable transportation kept you from medical appointments, meetings, work or from getting things needed for daily living? : No  Physical Activity: Unknown (02/03/2023)   Exercise Vital Sign    Days of Exercise per Week: Patient declined    Minutes of Exercise per Session: 30 min  Stress: Patient Declined (02/03/2023)   [employer] of Occupational Health - Occupational Stress Questionnaire    Feeling of Stress : Patient declined  Social Connections: Unknown (02/03/2023)   Social Connection and Isolation Panel [NHANES]    Frequency of Communication with Friends and Family: Patient declined     Frequency of Social Gatherings with Friends and Family: Patient declined    Attends Religious Services: Patient declined    Active Member of Clubs or Organizations: Patient declined    Attends Engineer, structural: More than 4 times per year    Marital Status: Patient declined  Intimate Partner Violence: Not At Risk (11/06/2022)   Humiliation, Afraid, Rape, and Kick questionnaire    Fear of Current or Ex-Partner: No    Emotionally Abused: No    Physically Abused: No    Sexually Abused: No   Family History  Problem Relation Age of Onset   Arthritis Mother    Heart attack Father        76s MI   Heart disease Father    Allergic rhinitis Sister    Asthma Sister    Allergic rhinitis Sister    Allergic rhinitis Brother    Heart disease Brother    Cancer Brother        liver   Asthma Maternal Grandmother    Heart attack Maternal Grandmother    Breast cancer Maternal Grandmother    Healthy Daughter    Healthy Daughter    Healthy Daughter    Healthy Daughter    Healthy Daughter    Food Allergy Niece    Thyroid cancer Neg Hx    Colon cancer Neg Hx    Prostate cancer Neg Hx    Ovarian cancer Neg Hx    Angioedema Neg Hx    Eczema Neg Hx    Atopy Neg Hx    Immunodeficiency Neg Hx    Allergies  Allergen Reactions   Anesthesia S-I-40 [Propofol] Nausea And Vomiting    Per patient need to be careful when giving this      Patient Care Team: Suzan Slick, MD as PCP - General (Family Medicine) Jodelle Red, MD as PCP - Cardiology (Cardiology) Jena Gauss, Gerrit Friends, MD as Consulting Physician (Gastroenterology) Pleasant, Kerry Kass, PA as Consulting Physician (Gastroenterology) Chilton Greathouse, MD as Consulting Physician (Pulmonary Disease)   Outpatient Medications Prior to Visit  Medication Sig   acetaminophen (TYLENOL) 500 MG tablet Take 1 tablet (500 mg total) by mouth every 8 (eight) hours for 10 days.   albuterol (VENTOLIN HFA) 108 (90 Base) MCG/ACT  inhaler Inhale 2 puffs into the lungs every 4 (four) hours as needed for wheezing or shortness of breath (coughing fits).   aspirin EC 325 MG tablet Take 1 tablet (325 mg total) by mouth daily.   Azelastine HCl 137 MCG/SPRAY SOLN Place into the nose.   budesonide-formoterol (SYMBICORT) 160-4.5 MCG/ACT inhaler Inhale 2 puffs into the lungs 2 (two) times daily.   celecoxib (CELEBREX) 100 MG capsule TAKE 1 CAPSULE BY MOUTH TWICE A DAY  Cholecalciferol (VITAMIN D-3 PO) Take by mouth.   dicyclomine (BENTYL) 20 MG tablet Take 20 mg by mouth 3 (three) times daily.   doxepin (SINEQUAN) 10 MG capsule Take 10 mg by mouth at bedtime as needed.   fluticasone (FLONASE) 50 MCG/ACT nasal spray Place 2 sprays into both nostrils daily.   hydrOXYzine (ATARAX) 10 MG tablet Take 10 mg by mouth 3 (three) times daily as needed for itching.   levothyroxine (EUTHYROX) 75 MCG tablet Take 1 tablet (75 mcg total) by mouth daily before breakfast.   linaclotide (LINZESS) 72 MCG capsule Take 72 mcg by mouth daily before breakfast.   losartan-hydrochlorothiazide (HYZAAR) 100-25 MG tablet Take 1 tablet by mouth daily.   meclizine (ANTIVERT) 25 MG tablet Take 25 mg by mouth 3 (three) times daily as needed for dizziness.   methocarbamol (ROBAXIN) 500 MG tablet Take 1 tablet (500 mg total) by mouth 4 (four) times daily.   montelukast (SINGULAIR) 10 MG tablet Take 1 tablet (10 mg total) by mouth at bedtime.   omeprazole (PRILOSEC) 40 MG capsule Take 1 capsule (40 mg total) by mouth daily.   oxyCODONE (ROXICODONE) 5 MG immediate release tablet Take 1 tablet (5 mg total) by mouth every 4 (four) hours as needed for severe pain (pain score 7-10) or breakthrough pain.   rosuvastatin (CRESTOR) 10 MG tablet Take 1 tablet (10 mg total) by mouth at bedtime. For cholesterol   Spacer/Aero-Holding Chambers (AEROCHAMBER PLUS) inhaler Use as instructed   [DISCONTINUED] budesonide-formoterol (SYMBICORT) 80-4.5 MCG/ACT inhaler Inhale 2 puffs into  the lungs 2 (two) times daily.   No facility-administered medications prior to visit.    Review of Systems  All other systems reviewed and are negative.        Objective:     BP 109/70   Pulse 66   Temp 97.7 F (36.5 C) (Oral)   Resp 18   Ht 5\' 3"  (1.6 m)   Wt 146 lb 6.4 oz (66.4 kg)   SpO2 99%   BMI 25.93 kg/m  BP Readings from Last 3 Encounters:  05/06/23 109/70  03/22/23 (!) 114/57  02/04/23 100/67      Physical Exam Vitals and nursing note reviewed.  Constitutional:      Appearance: Normal appearance. She is normal weight.  HENT:     Head: Normocephalic and atraumatic.     Right Ear: Tympanic membrane, ear canal and external ear normal.     Left Ear: Tympanic membrane, ear canal and external ear normal.     Nose: Nose normal.     Mouth/Throat:     Mouth: Mucous membranes are moist.     Pharynx: Oropharynx is clear.  Eyes:     Conjunctiva/sclera: Conjunctivae normal.     Pupils: Pupils are equal, round, and reactive to light.  Cardiovascular:     Rate and Rhythm: Normal rate and regular rhythm.     Pulses: Normal pulses.     Heart sounds: Normal heart sounds.  Pulmonary:     Effort: Pulmonary effort is normal.     Breath sounds: Normal breath sounds.  Abdominal:     General: Abdomen is flat. Bowel sounds are normal.  Skin:    General: Skin is warm.     Capillary Refill: Capillary refill takes less than 2 seconds.  Neurological:     General: No focal deficit present.     Mental Status: She is alert and oriented to person, place, and time. Mental status is at baseline.  Psychiatric:        Mood and Affect: Mood normal.        Behavior: Behavior normal.        Thought Content: Thought content normal.        Judgment: Judgment normal.     No results found for any visits on 05/06/23. Last CBC Lab Results  Component Value Date   WBC 5.6 09/17/2022   HGB 13.8 09/17/2022   HCT 41.9 09/17/2022   MCV 91 09/17/2022   MCH 29.9 09/17/2022   RDW 12.6  09/17/2022   PLT 240 09/17/2022   Last metabolic panel Lab Results  Component Value Date   GLUCOSE 84 09/17/2022   NA 139 09/17/2022   K 3.8 09/17/2022   CL 103 09/17/2022   CO2 22 09/17/2022   BUN 10 09/17/2022   CREATININE 0.68 09/17/2022   EGFR 92 09/17/2022   CALCIUM 9.6 09/17/2022   PROT 6.5 09/17/2022   ALBUMIN 4.4 09/17/2022   LABGLOB 2.1 09/17/2022   AGRATIO 2.3 (H) 07/21/2021   BILITOT 0.5 09/17/2022   ALKPHOS 71 09/17/2022   AST 27 09/17/2022   ALT 20 09/17/2022   ANIONGAP 12 09/09/2018   Last lipids Lab Results  Component Value Date   CHOL 108 09/17/2022   HDL 45 09/17/2022   LDLCALC 44 09/17/2022   TRIG 98 09/17/2022   CHOLHDL 2.4 09/17/2022   Last hemoglobin A1c No results found for: "HGBA1C"      Assessment & Plan:    Routine Health Maintenance and Physical Exam  Immunization History  Administered Date(s) Administered   Fluad Quad(high Dose 65+) 12/01/2021   Influenza, High Dose Seasonal PF 11/15/2015, 02/24/2017, 12/04/2017   Influenza,inj,Quad PF,6+ Mos 02/19/2017   Influenza-Unspecified 01/06/2020, 11/18/2020   PFIZER Comirnaty(Gray Top)Covid-19 Tri-Sucrose Vaccine 11/03/2022   PFIZER(Purple Top)SARS-COV-2 Vaccination 02/24/2019, 03/17/2019, 11/29/2019   Pneumococcal Conjugate-13 06/23/2019   Pneumococcal Polysaccharide-23 03/28/2016   Tdap 06/15/2015   Zoster Recombinant(Shingrix) 05/31/2021, 08/10/2021, 08/11/2022   Zoster, Live 08/10/2021    Health Maintenance  Topic Date Due   COVID-19 Vaccine (5 - 2024-25 season) 12/29/2022   Medicare Annual Wellness (AWV)  07/04/2023   INFLUENZA VACCINE  05/06/2023 (Originally 09/06/2022)   Colonoscopy  11/26/2023   MAMMOGRAM  03/17/2024   DTaP/Tdap/Td (2 - Td or Tdap) 06/14/2025   DEXA SCAN  06/21/2025   Pneumonia Vaccine 72+ Years old  Completed   Hepatitis C Screening  Completed   Zoster Vaccines- Shingrix  Completed   HPV VACCINES  Aged Out    Discussed health benefits of physical  activity, and encouraged her to engage in regular exercise appropriate for her age and condition.  Problem List Items Addressed This Visit   None  No follow-ups on file. Annual physical exam  Encounter for lipid screening for cardiovascular disease -     Lipid panel  Impaired glucose tolerance -     CBC with Differential/Platelet -     Comprehensive metabolic panel with GFR -     Hemoglobin A1c   Screening CPE labs See back for AWV in May.    Suzan Slick, MD

## 2023-05-07 ENCOUNTER — Encounter: Payer: Self-pay | Admitting: Family Medicine

## 2023-05-07 LAB — CBC WITH DIFFERENTIAL/PLATELET
Basophils Absolute: 0 10*3/uL (ref 0.0–0.2)
Basos: 0 %
EOS (ABSOLUTE): 0 10*3/uL (ref 0.0–0.4)
Eos: 0 %
Hematocrit: 44.5 % (ref 34.0–46.6)
Hemoglobin: 15.1 g/dL (ref 11.1–15.9)
Immature Grans (Abs): 0 10*3/uL (ref 0.0–0.1)
Immature Granulocytes: 0 %
Lymphocytes Absolute: 1.8 10*3/uL (ref 0.7–3.1)
Lymphs: 23 %
MCH: 30.8 pg (ref 26.6–33.0)
MCHC: 33.9 g/dL (ref 31.5–35.7)
MCV: 91 fL (ref 79–97)
Monocytes Absolute: 0.5 10*3/uL (ref 0.1–0.9)
Monocytes: 6 %
Neutrophils Absolute: 5.6 10*3/uL (ref 1.4–7.0)
Neutrophils: 71 %
Platelets: 293 10*3/uL (ref 150–450)
RBC: 4.91 x10E6/uL (ref 3.77–5.28)
RDW: 12.8 % (ref 11.7–15.4)
WBC: 7.9 10*3/uL (ref 3.4–10.8)

## 2023-05-07 LAB — COMPREHENSIVE METABOLIC PANEL WITH GFR
ALT: 36 IU/L — ABNORMAL HIGH (ref 0–32)
AST: 39 IU/L (ref 0–40)
Albumin: 4.6 g/dL (ref 3.8–4.8)
Alkaline Phosphatase: 69 IU/L (ref 44–121)
BUN/Creatinine Ratio: 16 (ref 12–28)
BUN: 12 mg/dL (ref 8–27)
Bilirubin Total: 0.5 mg/dL (ref 0.0–1.2)
CO2: 25 mmol/L (ref 20–29)
Calcium: 10.1 mg/dL (ref 8.7–10.3)
Chloride: 98 mmol/L (ref 96–106)
Creatinine, Ser: 0.77 mg/dL (ref 0.57–1.00)
Globulin, Total: 2.7 g/dL (ref 1.5–4.5)
Glucose: 87 mg/dL (ref 70–99)
Potassium: 3.7 mmol/L (ref 3.5–5.2)
Sodium: 141 mmol/L (ref 134–144)
Total Protein: 7.3 g/dL (ref 6.0–8.5)
eGFR: 82 mL/min/{1.73_m2} (ref >59)

## 2023-05-07 LAB — LIPID PANEL
Chol/HDL Ratio: 2.1 ratio (ref 0.0–4.4)
Cholesterol, Total: 98 mg/dL — ABNORMAL LOW (ref 100–199)
HDL: 46 mg/dL (ref >39)
LDL Chol Calc (NIH): 36 mg/dL (ref 0–99)
Triglycerides: 80 mg/dL (ref 0–149)
VLDL Cholesterol Cal: 16 mg/dL (ref 5–40)

## 2023-05-07 LAB — HEMOGLOBIN A1C
Est. average glucose Bld gHb Est-mCnc: 111 mg/dL
Hgb A1c MFr Bld: 5.5 % (ref 4.8–5.6)

## 2023-05-08 ENCOUNTER — Encounter (HOSPITAL_BASED_OUTPATIENT_CLINIC_OR_DEPARTMENT_OTHER): Payer: Self-pay | Admitting: Orthopaedic Surgery

## 2023-05-08 ENCOUNTER — Encounter: Payer: Self-pay | Admitting: Family Medicine

## 2023-05-10 NOTE — Telephone Encounter (Signed)
 I left a message for the patient to call to schedule her surgery. Clearance has been received. 05/09/23

## 2023-06-04 ENCOUNTER — Other Ambulatory Visit: Payer: Self-pay

## 2023-06-04 ENCOUNTER — Encounter (HOSPITAL_BASED_OUTPATIENT_CLINIC_OR_DEPARTMENT_OTHER): Payer: Self-pay | Admitting: Orthopaedic Surgery

## 2023-06-04 DIAGNOSIS — J4540 Moderate persistent asthma, uncomplicated: Secondary | ICD-10-CM | POA: Diagnosis not present

## 2023-06-04 DIAGNOSIS — R058 Other specified cough: Secondary | ICD-10-CM | POA: Diagnosis not present

## 2023-06-04 DIAGNOSIS — K219 Gastro-esophageal reflux disease without esophagitis: Secondary | ICD-10-CM | POA: Diagnosis not present

## 2023-06-05 ENCOUNTER — Encounter (HOSPITAL_BASED_OUTPATIENT_CLINIC_OR_DEPARTMENT_OTHER)
Admission: RE | Admit: 2023-06-05 | Discharge: 2023-06-05 | Disposition: A | Discharge status: Home / Self Care | Source: Ambulatory Visit | Attending: Orthopaedic Surgery | Admitting: Orthopaedic Surgery

## 2023-06-05 DIAGNOSIS — Z01810 Encounter for preprocedural cardiovascular examination: Secondary | ICD-10-CM | POA: Diagnosis present

## 2023-06-05 DIAGNOSIS — I10 Essential (primary) hypertension: Secondary | ICD-10-CM | POA: Diagnosis not present

## 2023-06-05 DIAGNOSIS — Z01818 Encounter for other preprocedural examination: Secondary | ICD-10-CM | POA: Diagnosis not present

## 2023-06-05 DIAGNOSIS — Z01812 Encounter for preprocedural laboratory examination: Secondary | ICD-10-CM | POA: Diagnosis present

## 2023-06-05 LAB — SURGICAL PCR SCREEN
MRSA, PCR: NEGATIVE
Staphylococcus aureus: NEGATIVE

## 2023-06-05 LAB — BASIC METABOLIC PANEL WITH GFR
Anion gap: 12 (ref 5–15)
BUN: 11 mg/dL (ref 8–23)
CO2: 26 mmol/L (ref 22–32)
Calcium: 9.5 mg/dL (ref 8.9–10.3)
Chloride: 102 mmol/L (ref 98–111)
Creatinine, Ser: 0.66 mg/dL (ref 0.44–1.00)
GFR, Estimated: >60 mL/min (ref >60)
Glucose, Bld: 80 mg/dL (ref 70–99)
Potassium: 3.6 mmol/L (ref 3.5–5.1)
Sodium: 140 mmol/L (ref 135–145)

## 2023-06-05 NOTE — Progress Notes (Signed)

## 2023-06-06 ENCOUNTER — Telehealth: Payer: Self-pay | Admitting: *Deleted

## 2023-06-06 ENCOUNTER — Telehealth: Payer: Self-pay | Admitting: Cardiology

## 2023-06-06 ENCOUNTER — Encounter (HOSPITAL_BASED_OUTPATIENT_CLINIC_OR_DEPARTMENT_OTHER): Payer: Self-pay | Admitting: Orthopaedic Surgery

## 2023-06-06 ENCOUNTER — Encounter (HOSPITAL_BASED_OUTPATIENT_CLINIC_OR_DEPARTMENT_OTHER): Payer: Self-pay

## 2023-06-06 NOTE — Telephone Encounter (Signed)
 Pt has been scheduled to see Neomi Banks, NP at [facility] 06/21/23 for preop clearance.,

## 2023-06-06 NOTE — Telephone Encounter (Signed)
 Patient Calls Doctor, hospital First) View All Conversations on this Encounter Blain, Katlyn D, NP  Cv Div Preop Callback11 minutes ago (11:56 AM)    Clearance request was sent in as an advice request, please see from 5/1.   West, Katlyn D, NP11 minutes ago (11:56 AM)       Name: Diane Grant  DOB: [birth year]  MRN: [id number]   Primary Cardiologist: Sheryle Donning, MD Last OV: 07/27/2021   Chart reviewed as part of pre-operative protocol coverage. Because of Diane Grant's past medical history and time since last visit, she will require a follow-up in-office visit in order to better assess preoperative cardiovascular risk.   Pre-op covering staff: - Please schedule appointment and call patient to inform them. If patient already had an upcoming appointment within acceptable timeframe, please add "pre-op clearance" to the appointment notes so provider is aware. - Please contact requesting surgeon's office via preferred method (i.e, phone, fax) to inform them of need for appointment prior to surgery.   Katlyn D West, NP  06/06/2023, 11:54 AM        Note   All Conversations: Pre-Op Test Results EKG (Newest Message First)  06/06/23 11:26 AM Guss Legacy, RN routed this conversation to Cv Div Preop Guss Legacy, RN  KW   06/06/23 11:26 AM Note     Pre-operative Risk Assessment    Patient Name: Diane Grant  DOB: 08/05/1950 MRN: [id number]   Date of last office visit: 07/27/2021 Date of next office visit: Not scheduled   Request for Surgical Clearance    Procedure:   left reverse shoulder arthroplasty    Date of Surgery:  Clearance TBD                                  Surgeon:  Dr. Hermina Loosen  Surgeon's Group or Practice Name:  [facility]  Phone number:  [phone] Fax number:  [phone]   Type of Clearance Requested:   - Medical    Type of Anesthesia:  General    Additional requests/questions:  Please fax  a copy of clearance  to the surgeon's office.   Signed, Guss Legacy   06/06/2023, 11:23 AM        Guss Legacy, RN to Diane Grant "Diane Grant    06/06/23  9:23 AM Hi there,    The performing surgeon would need to send a preoperative clearance request to [phone], if they are concerned about her EKG reading.   Sinus bradycardia is a normal rhythm that is just slow. Not abnormal.   Last read by [name] "Stana Ear" at 9:52AM on 06/06/2023. Diane Grant "Diane Grant" to P Cv Div Dwb Triage (supporting Sheryle Donning, MD)      06/06/23  9:19 AM Good morning Ms. Diane Grant,    My name is Diane Grant, Diane Grant's daughter. I am listed as a person in her chart that can speak on her behalf. She has a shoulder replacement surgery scheduled for this Tuesday, May 6th and she had an EKG yesterday as part of the pre-op. They listed Sinus Bradycardia on the EKG and I'm wondering if this is something that should be discussed with you first before having her surgery. I'm concerned with anesthesia if this would be an issue. Should she make an appointment with you and postpone the surgery?    Thank you,    Diane Grant  This encounter is not signed. The conversation may still be ongoing. Additional Documentation  Encounter Info: Billing Info,   History,   Allergies,   Detailed Report

## 2023-06-06 NOTE — Telephone Encounter (Signed)
   Pre-operative Risk Assessment    Patient Name: Diane Grant  DOB: Jun 09, 1950 MRN: [id number]   Date of last office visit: 07/27/2021 Date of next office visit: Not scheduled   Request for Surgical Clearance    Procedure:   left reverse shoulder arthroplasty   Date of Surgery:  Clearance TBD                                Surgeon:  Dr. Hermina Loosen  Surgeon's Group or Practice Name:  [facility]  Phone number:  [phone] Fax number:  [phone]   Type of Clearance Requested:   - Medical    Type of Anesthesia:  General    Additional requests/questions:  Please fax a copy of clearance  to the surgeon's office.  Signed, Guss Legacy   06/06/2023, 11:23 AM

## 2023-06-06 NOTE — Telephone Encounter (Signed)
   Name: Diane Grant  DOB: 1950/06/14  MRN: [id number]  Primary Cardiologist: Sheryle Donning, MD Last OV: 07/27/2021  Chart reviewed as part of pre-operative protocol coverage. Because of Diane Grant's past medical history and time since last visit, she will require a follow-up in-office visit in order to better assess preoperative cardiovascular risk.  Pre-op covering staff: - Please schedule appointment and call patient to inform them. If patient already had an upcoming appointment within acceptable timeframe, please add "pre-op clearance" to the appointment notes so provider is aware. - Please contact requesting surgeon's office via preferred method (i.e, phone, fax) to inform them of need for appointment prior to surgery.  Keeley Sussman D Takeesha Isley, NP  06/06/2023, 11:54 AM

## 2023-06-07 ENCOUNTER — Telehealth: Payer: Self-pay | Admitting: Orthopaedic Surgery

## 2023-06-07 NOTE — Telephone Encounter (Signed)
 I spoke with Tanis Fan, patient's daughter and advised her per Cardiology EKG showed slow rhythm not abnormal. Patient is still okay to proceed with surgery as scheduled. I did advised if patient wants to keep her Cardiology appointment on 5/16 to discuss EKG she is welcome too, but we do not need a Cardiac clearance for her surgery. Daughter understood.

## 2023-06-07 NOTE — Telephone Encounter (Signed)
 Patient daughter called and wants to know that if she should proceed with surgery only because during the pre-admission she had the EKG and it showed Sinus Bradycardia. Will that not mix with the anesthesia? CB# [phone]

## 2023-06-10 NOTE — Progress Notes (Signed)
 Patient called PAT nurse to ask if she could take anything for cough before her surgery tomorrow. Patient stated she has allergies and has a lot of drainage and is coughing, no fever and she feels totally fine. I spoke with patient and told her that anesthesia will need to assess her once she arrives for surgery tomorrow and it will be to their discretion if she is going to have surgery based on her symptoms. Patient verbalized understanding.

## 2023-06-10 NOTE — Anesthesia Preprocedure Evaluation (Signed)
 Anesthesia Evaluation  Patient identified by MRN, date of birth, ID band Patient awake    Reviewed: Allergy  & Precautions, NPO status , Patient's Chart, lab work & pertinent test results  Airway Mallampati: II  TM Distance: >3 FB Neck ROM: Full    Dental no notable dental hx. (+) Chipped,    Pulmonary asthma (symbicort  last used yesterday)    Pulmonary exam normal breath sounds clear to auscultation       Cardiovascular hypertension (129/90 preop), Pt. on medications Normal cardiovascular exam Rhythm:Regular Rate:Normal     Neuro/Psych negative neurological ROS  negative psych ROS   GI/Hepatic Neg liver ROS,GERD  Medicated and Controlled,,  Endo/Other  Hypothyroidism    Renal/GU negative Renal ROS  negative genitourinary   Musculoskeletal Chronic pain: oxy 5mg    Abdominal   Peds  Hematology negative hematology ROS (+)   Anesthesia Other Findings   Reproductive/Obstetrics negative OB ROS                             Anesthesia Physical Anesthesia Plan  ASA: 2  Anesthesia Plan: General and Regional   Post-op Pain Management: Regional block* and Tylenol  PO (pre-op)*   Induction: Intravenous  PONV Risk Score and Plan: 3 and Ondansetron , Dexamethasone, Midazolam and Treatment may vary due to age or medical condition  Airway Management Planned: Oral ETT  Additional Equipment: None  Intra-op Plan:   Post-operative Plan: Extubation in OR  Informed Consent: I have reviewed the patients History and Physical, chart, labs and discussed the procedure including the risks, benefits and alternatives for the proposed anesthesia with the patient or authorized representative who has indicated his/her understanding and acceptance.     Dental advisory given  Plan Discussed with: CRNA  Anesthesia Plan Comments:        Anesthesia Quick Evaluation

## 2023-06-11 ENCOUNTER — Ambulatory Visit (HOSPITAL_BASED_OUTPATIENT_CLINIC_OR_DEPARTMENT_OTHER)
Admission: RE | Admit: 2023-06-11 | Discharge: 2023-06-11 | Disposition: A | Discharge status: Home / Self Care | Attending: Orthopaedic Surgery | Admitting: Orthopaedic Surgery

## 2023-06-11 ENCOUNTER — Ambulatory Visit (HOSPITAL_BASED_OUTPATIENT_CLINIC_OR_DEPARTMENT_OTHER): Admitting: Anesthesiology

## 2023-06-11 ENCOUNTER — Ambulatory Visit (HOSPITAL_COMMUNITY)

## 2023-06-11 ENCOUNTER — Other Ambulatory Visit: Payer: Self-pay

## 2023-06-11 ENCOUNTER — Encounter (HOSPITAL_BASED_OUTPATIENT_CLINIC_OR_DEPARTMENT_OTHER): Payer: Self-pay | Admitting: Orthopaedic Surgery

## 2023-06-11 ENCOUNTER — Encounter (HOSPITAL_BASED_OUTPATIENT_CLINIC_OR_DEPARTMENT_OTHER)
Admission: RE | Disposition: A | Discharge status: Home / Self Care | Payer: Self-pay | Source: Home / Self Care | Attending: Orthopaedic Surgery

## 2023-06-11 DIAGNOSIS — E039 Hypothyroidism, unspecified: Secondary | ICD-10-CM | POA: Insufficient documentation

## 2023-06-11 DIAGNOSIS — M12812 Other specific arthropathies, not elsewhere classified, left shoulder: Secondary | ICD-10-CM | POA: Diagnosis not present

## 2023-06-11 DIAGNOSIS — I10 Essential (primary) hypertension: Secondary | ICD-10-CM | POA: Insufficient documentation

## 2023-06-11 DIAGNOSIS — Z01818 Encounter for other preprocedural examination: Secondary | ICD-10-CM

## 2023-06-11 DIAGNOSIS — Z79899 Other long term (current) drug therapy: Secondary | ICD-10-CM | POA: Insufficient documentation

## 2023-06-11 DIAGNOSIS — Z8261 Family history of arthritis: Secondary | ICD-10-CM | POA: Diagnosis not present

## 2023-06-11 DIAGNOSIS — Z471 Aftercare following joint replacement surgery: Secondary | ICD-10-CM | POA: Diagnosis not present

## 2023-06-11 DIAGNOSIS — K219 Gastro-esophageal reflux disease without esophagitis: Secondary | ICD-10-CM | POA: Diagnosis not present

## 2023-06-11 DIAGNOSIS — Z96612 Presence of left artificial shoulder joint: Secondary | ICD-10-CM | POA: Diagnosis not present

## 2023-06-11 DIAGNOSIS — M75102 Unspecified rotator cuff tear or rupture of left shoulder, not specified as traumatic: Secondary | ICD-10-CM | POA: Insufficient documentation

## 2023-06-11 DIAGNOSIS — X58XXXA Exposure to other specified factors, initial encounter: Secondary | ICD-10-CM | POA: Insufficient documentation

## 2023-06-11 DIAGNOSIS — J45909 Unspecified asthma, uncomplicated: Secondary | ICD-10-CM | POA: Insufficient documentation

## 2023-06-11 DIAGNOSIS — G8929 Other chronic pain: Secondary | ICD-10-CM | POA: Insufficient documentation

## 2023-06-11 DIAGNOSIS — M75122 Complete rotator cuff tear or rupture of left shoulder, not specified as traumatic: Secondary | ICD-10-CM

## 2023-06-11 DIAGNOSIS — Z9889 Other specified postprocedural states: Secondary | ICD-10-CM | POA: Diagnosis not present

## 2023-06-11 DIAGNOSIS — M19012 Primary osteoarthritis, left shoulder: Secondary | ICD-10-CM | POA: Diagnosis not present

## 2023-06-11 DIAGNOSIS — G8918 Other acute postprocedural pain: Secondary | ICD-10-CM | POA: Diagnosis not present

## 2023-06-11 HISTORY — DX: Hypothyroidism, unspecified: E03.9

## 2023-06-11 HISTORY — DX: Gastro-esophageal reflux disease without esophagitis: K21.9

## 2023-06-11 HISTORY — DX: Unspecified asthma, uncomplicated: J45.909

## 2023-06-11 HISTORY — PX: REVERSE SHOULDER ARTHROPLASTY: SHX5054

## 2023-06-11 SURGERY — ARTHROPLASTY, SHOULDER, TOTAL, REVERSE
Anesthesia: Regional | Site: Shoulder | Laterality: Left

## 2023-06-11 MED ORDER — ROPIVACAINE HCL 5 MG/ML IJ SOLN
INTRAMUSCULAR | Status: DC | PRN
  Administered 2023-06-11: 15 mL via PERINEURAL

## 2023-06-11 MED ORDER — ONDANSETRON HCL 4 MG/2ML IJ SOLN
INTRAMUSCULAR | Status: AC
  Filled 2023-06-11: qty 2

## 2023-06-11 MED ORDER — VANCOMYCIN HCL 1000 MG IV SOLR
INTRAVENOUS | Status: DC | PRN
  Administered 2023-06-11: 1000 mg via TOPICAL

## 2023-06-11 MED ORDER — PROPOFOL 10 MG/ML IV BOLUS
INTRAVENOUS | Status: AC
  Filled 2023-06-11: qty 20

## 2023-06-11 MED ORDER — 0.9 % SODIUM CHLORIDE (POUR BTL) OPTIME
TOPICAL | Status: DC | PRN
  Administered 2023-06-11: 1000 mL

## 2023-06-11 MED ORDER — FENTANYL CITRATE (PF) 100 MCG/2ML IJ SOLN
INTRAMUSCULAR | Status: DC | PRN
  Administered 2023-06-11: 50 ug via INTRAVENOUS

## 2023-06-11 MED ORDER — EPHEDRINE 5 MG/ML INJ
INTRAVENOUS | Status: AC
Start: 2023-06-11 — End: ?
  Filled 2023-06-11: qty 5

## 2023-06-11 MED ORDER — LIDOCAINE HCL (CARDIAC) PF 100 MG/5ML IV SOSY
PREFILLED_SYRINGE | INTRAVENOUS | Status: DC | PRN
Start: 2023-06-11 — End: 2023-06-11
  Administered 2023-06-11: 40 mg via INTRAVENOUS

## 2023-06-11 MED ORDER — SODIUM CHLORIDE 0.9 % IV SOLN
INTRAVENOUS | Status: AC | PRN
  Administered 2023-06-11: 1000 mL

## 2023-06-11 MED ORDER — ROCURONIUM BROMIDE 100 MG/10ML IV SOLN
INTRAVENOUS | Status: DC | PRN
  Administered 2023-06-11: 60 mg via INTRAVENOUS

## 2023-06-11 MED ORDER — SUGAMMADEX SODIUM 200 MG/2ML IV SOLN
INTRAVENOUS | Status: DC | PRN
  Administered 2023-06-11: 150 mg via INTRAVENOUS

## 2023-06-11 MED ORDER — DEXAMETHASONE SODIUM PHOSPHATE 10 MG/ML IJ SOLN
INTRAMUSCULAR | Status: DC | PRN
  Administered 2023-06-11: 10 mg via INTRAVENOUS

## 2023-06-11 MED ORDER — PROPOFOL 10 MG/ML IV BOLUS
INTRAVENOUS | Status: DC | PRN
  Administered 2023-06-11: 150 mg via INTRAVENOUS

## 2023-06-11 MED ORDER — TRANEXAMIC ACID-NACL 1000-0.7 MG/100ML-% IV SOLN
1000.0000 mg | INTRAVENOUS | Status: AC
Start: 2023-06-11 — End: 2023-06-11
  Administered 2023-06-11: 1000 mg via INTRAVENOUS

## 2023-06-11 MED ORDER — ACETAMINOPHEN 500 MG PO TABS
ORAL_TABLET | ORAL | Status: AC
Start: 2023-06-11 — End: ?
  Filled 2023-06-11: qty 2

## 2023-06-11 MED ORDER — AMISULPRIDE (ANTIEMETIC) 5 MG/2ML IV SOLN: 10.0000 mg | Freq: Once | INTRAVENOUS | Status: DC | PRN

## 2023-06-11 MED ORDER — ACETAMINOPHEN 500 MG PO TABS
1000.0000 mg | ORAL_TABLET | Freq: Once | ORAL | Status: AC
Start: 2023-06-11 — End: 2023-06-11
  Administered 2023-06-11: 1000 mg via ORAL

## 2023-06-11 MED ORDER — PHENYLEPHRINE 80 MCG/ML (10ML) SYRINGE FOR IV PUSH (FOR BLOOD PRESSURE SUPPORT)
PREFILLED_SYRINGE | INTRAVENOUS | Status: AC
  Filled 2023-06-11: qty 10

## 2023-06-11 MED ORDER — EPHEDRINE SULFATE (PRESSORS) 50 MG/ML IJ SOLN
INTRAMUSCULAR | Status: DC | PRN
  Administered 2023-06-11: 5 mg via INTRAVENOUS

## 2023-06-11 MED ORDER — CEFAZOLIN SODIUM-DEXTROSE 2-4 GM/100ML-% IV SOLN
2.0000 g | INTRAVENOUS | Status: AC
  Administered 2023-06-11: 2 g via INTRAVENOUS

## 2023-06-11 MED ORDER — CEFAZOLIN SODIUM-DEXTROSE 2-4 GM/100ML-% IV SOLN
INTRAVENOUS | Status: AC
Start: 2023-06-11 — End: ?
  Filled 2023-06-11: qty 100

## 2023-06-11 MED ORDER — BUPIVACAINE LIPOSOME 1.3 % IJ SUSP
INTRAMUSCULAR | Status: DC | PRN
  Administered 2023-06-11: 10 mL via PERINEURAL

## 2023-06-11 MED ORDER — FENTANYL CITRATE (PF) 100 MCG/2ML IJ SOLN
INTRAMUSCULAR | Status: AC
Start: 2023-06-11 — End: ?
  Filled 2023-06-11: qty 2

## 2023-06-11 MED ORDER — OXYCODONE HCL 5 MG/5ML PO SOLN: 5.0000 mg | Freq: Once | ORAL | Status: DC | PRN

## 2023-06-11 MED ORDER — FENTANYL CITRATE (PF) 100 MCG/2ML IJ SOLN
INTRAMUSCULAR | Status: AC
  Filled 2023-06-11: qty 2

## 2023-06-11 MED ORDER — MIDAZOLAM HCL 2 MG/2ML IJ SOLN
INTRAMUSCULAR | Status: AC
Start: 2023-06-11 — End: ?
  Filled 2023-06-11: qty 2

## 2023-06-11 MED ORDER — DEXAMETHASONE SODIUM PHOSPHATE 10 MG/ML IJ SOLN
INTRAMUSCULAR | Status: AC
  Filled 2023-06-11: qty 1

## 2023-06-11 MED ORDER — GABAPENTIN 300 MG PO CAPS
300.0000 mg | ORAL_CAPSULE | Freq: Once | ORAL | Status: DC
Start: 2023-06-11 — End: 2023-06-11

## 2023-06-11 MED ORDER — ONDANSETRON HCL 4 MG/2ML IJ SOLN: 4.0000 mg | Freq: Once | INTRAMUSCULAR | Status: DC | PRN

## 2023-06-11 MED ORDER — PHENYLEPHRINE HCL (PRESSORS) 10 MG/ML IV SOLN
INTRAVENOUS | Status: DC | PRN
Start: 2023-06-11 — End: 2023-06-11
  Administered 2023-06-11: 80 ug via INTRAVENOUS

## 2023-06-11 MED ORDER — PHENYLEPHRINE HCL-NACL 20-0.9 MG/250ML-% IV SOLN
INTRAVENOUS | Status: DC | PRN
  Administered 2023-06-11: 50 ug/min via INTRAVENOUS

## 2023-06-11 MED ORDER — GABAPENTIN 300 MG PO CAPS
ORAL_CAPSULE | ORAL | Status: AC
  Filled 2023-06-11: qty 1

## 2023-06-11 MED ORDER — LACTATED RINGERS IV SOLN: INTRAVENOUS | Status: DC

## 2023-06-11 MED ORDER — FENTANYL CITRATE (PF) 100 MCG/2ML IJ SOLN
50.0000 ug | Freq: Once | INTRAMUSCULAR | Status: AC
  Administered 2023-06-11: 50 ug via INTRAVENOUS

## 2023-06-11 MED ORDER — TRANEXAMIC ACID-NACL 1000-0.7 MG/100ML-% IV SOLN
INTRAVENOUS | Status: AC
  Filled 2023-06-11: qty 100

## 2023-06-11 MED ORDER — ONDANSETRON HCL 4 MG/2ML IJ SOLN
INTRAMUSCULAR | Status: DC | PRN
  Administered 2023-06-11: 4 mg via INTRAVENOUS

## 2023-06-11 MED ORDER — HYDROMORPHONE HCL 1 MG/ML IJ SOLN: 0.2500 mg | INTRAMUSCULAR | Status: DC | PRN

## 2023-06-11 MED ORDER — POVIDONE-IODINE 10 % EX SOLN
CUTANEOUS | Status: DC | PRN
  Administered 2023-06-11: 1 via TOPICAL

## 2023-06-11 MED ORDER — MIDAZOLAM HCL 2 MG/2ML IJ SOLN
1.0000 mg | Freq: Once | INTRAMUSCULAR | Status: AC
  Administered 2023-06-11: 1 mg via INTRAVENOUS

## 2023-06-11 MED ORDER — OXYCODONE HCL 5 MG PO TABS: 5.0000 mg | ORAL_TABLET | Freq: Once | ORAL | Status: DC | PRN

## 2023-06-11 MED ORDER — LIDOCAINE 2% (20 MG/ML) 5 ML SYRINGE
INTRAMUSCULAR | Status: AC
  Filled 2023-06-11: qty 5

## 2023-06-11 SURGICAL SUPPLY — 56 items
AUGMENT BASEPLATE 15DEG 25 WDG (Joint) IMPLANT
BIT DRILL 3.2 PERIPHERAL SCREW (BIT) IMPLANT
BLADE SAW SGTL 73X25 THK (BLADE) ×2 IMPLANT
BLADE SURG 10 STRL SS (BLADE) IMPLANT
BLADE SURG 15 STRL LF DISP TIS (BLADE) IMPLANT
BRUSH SCRUB EZ PLAIN DRY (MISCELLANEOUS) IMPLANT
CHLORAPREP W/TINT 26 (MISCELLANEOUS) ×2 IMPLANT
CLSR STERI-STRIP ANTIMIC 1/2X4 (GAUZE/BANDAGES/DRESSINGS) IMPLANT
COOLER ICEMAN CLASSIC (MISCELLANEOUS) ×2 IMPLANT
COVER BACK TABLE 60X90IN (DRAPES) ×2 IMPLANT
COVER MAYO STAND STRL (DRAPES) ×2 IMPLANT
DERMABOND ADVANCED .7 DNX12 (GAUZE/BANDAGES/DRESSINGS) IMPLANT
DRAPE IMP U-DRAPE 54X76 (DRAPES) IMPLANT
DRAPE INCISE IOBAN 66X45 STRL (DRAPES) ×2 IMPLANT
DRAPE POUCH INSTRU U-SHP 10X18 (DRAPES) ×2 IMPLANT
DRAPE U-SHAPE 47X51 STRL (DRAPES) ×4 IMPLANT
DRAPE U-SHAPE 76X120 STRL (DRAPES) ×4 IMPLANT
DRSG AQUACEL AG ADV 3.5X10 (GAUZE/BANDAGES/DRESSINGS) ×2 IMPLANT
ELECTRODE BLDE 4.0 EZ CLN MEGD (MISCELLANEOUS) ×2 IMPLANT
ELECTRODE REM PT RTRN 9FT ADLT (ELECTROSURGICAL) ×2 IMPLANT
GAUZE XEROFORM 1X8 LF (GAUZE/BANDAGES/DRESSINGS) IMPLANT
GLOVE BIO SURGEON STRL SZ 6 (GLOVE) ×4 IMPLANT
GLOVE BIO SURGEON STRL SZ7.5 (GLOVE) ×4 IMPLANT
GLOVE BIOGEL PI IND STRL 6.5 (GLOVE) ×2 IMPLANT
GLOVE BIOGEL PI IND STRL 8 (GLOVE) ×2 IMPLANT
GOWN STRL REUS W/ TWL LRG LVL3 (GOWN DISPOSABLE) ×4 IMPLANT
GOWN STRL REUS W/TWL XL LVL3 (GOWN DISPOSABLE) ×2 IMPLANT
GUIDEWIRE GLENOID 2.5X220 (WIRE) IMPLANT
INSERT REV SZ 1/2 (Insert) IMPLANT
KIT STABILIZATION SHOULDER (MISCELLANEOUS) ×2 IMPLANT
MANIFOLD NEPTUNE II (INSTRUMENTS) ×2 IMPLANT
PACK BASIN DAY SURGERY FS (CUSTOM PROCEDURE TRAY) ×2 IMPLANT
PACK SHOULDER (CUSTOM PROCEDURE TRAY) ×2 IMPLANT
PAD COLD SHLDR WRAP-ON (PAD) ×2 IMPLANT
PIN GUIDE 3X75 SHOULDER (PIN) IMPLANT
RESTRAINT HEAD UNIVERSAL NS (MISCELLANEOUS) ×2 IMPLANT
SCREW 5.5X26 (Screw) IMPLANT
SCREW BONE INTRNL SM 7 (Screw) IMPLANT
SCREW PERIPHERAL 30 (Screw) IMPLANT
SET HNDPC FAN SPRY TIP SCT (DISPOSABLE) ×2 IMPLANT
SHEET MEDIUM DRAPE 40X70 STRL (DRAPES) ×2 IMPLANT
SLEEVE SCD COMPRESS KNEE MED (STOCKING) ×2 IMPLANT
SPHERE GLENOID LATERALIZED 33 (Joint) IMPLANT
SPIKE FLUID TRANSFER (MISCELLANEOUS) IMPLANT
SPONGE T-LAP 18X18 ~~LOC~~+RFID (SPONGE) ×2 IMPLANT
STEM HUM PLUS SHORT SZ1+ (Stem) IMPLANT
SUCTION TUBE FRAZIER 10FR DISP (SUCTIONS) ×2 IMPLANT
SUT ETHIBOND 2 V 37 (SUTURE) ×2 IMPLANT
SUT ETHILON 3 0 PS 1 (SUTURE) IMPLANT
SUT MNCRL AB 3-0 PS2 27 (SUTURE) ×2 IMPLANT
SUT VIC AB 0 CT1 27XBRD ANBCTR (SUTURE) ×4 IMPLANT
SUT VIC AB 2-0 CT1 TAPERPNT 27 (SUTURE) ×4 IMPLANT
SUT VIC AB 3-0 SH 27X BRD (SUTURE) IMPLANT
SYR 50ML LL SCALE MARK (SYRINGE) ×2 IMPLANT
TOWEL GREEN STERILE FF (TOWEL DISPOSABLE) ×6 IMPLANT
TUBE SUCTION HIGH CAP CLEAR NV (SUCTIONS) ×2 IMPLANT

## 2023-06-11 NOTE — Discharge Instructions (Addendum)
 Discharge Instructions    Attending Surgeon: Wilhelmenia Harada, MD Office Phone Number: [phone]   Diagnosis and Procedures:    Surgeries Performed: Left shoulder reverse shoulder arthroplasty  Discharge Plan:    Diet: Resume usual diet. Begin with light or bland foods.  Drink plenty of fluids.  Activity:  Keep sling and dressing in place until your follow up visit in Physical Therapy You are advised to go home directly from the hospital or surgical center. Restrict your activities.  GENERAL INSTRUCTIONS: 1.  Keep your surgical site elevated above your heart for at least 5-7 days or longer to prevent swelling. This will improve your comfort and your overall recovery following surgery.     2. Please call Dr. Verline Glow office at [phone] with questions Monday-Friday during business hours. If no one answers, please leave a message and someone should get back to the patient within 24 hours. For emergencies please call 911 or proceed to the emergency room.   3. Patient to notify surgical team if experiences any of the following: Bowel/Bladder dysfunction, uncontrolled pain, nerve/muscle weakness, incision with increased drainage or redness, nausea/vomiting and Fever greater than 101.0 F.  Be alert for signs of infection including redness, streaking, odor, fever or chills. Be alert for excessive pain or bleeding and notify your surgeon immediately.  WOUND INSTRUCTIONS:   Leave your dressing/cast/splint in place until your post operative visit.  Keep it clean and dry.  Always keep the incision clean and dry until the staples/sutures are removed. If there is no drainage from the incision you should keep it open to air. If there is drainage from the incision you must keep it covered at all times until the drainage stops  Do not soak in a bath tub, hot tub, pool, lake or other body of water until 21 days after your surgery and your incision is completely dry and healed.  If you  have removable sutures (or staples) they must be removed 10-14 days (unless otherwise instructed) from the day of your surgery.     1)  Elevate the extremity as much as possible.  2)  Keep the dressing clean and dry.  3)  Please call us  if the dressing becomes wet or dirty.  4)  If you are experiencing worsening pain or worsening swelling, please call.     MEDICATIONS: Resume all previous home medications at the previous prescribed dose and frequency unless otherwise noted Start taking the  pain medications on an as-needed basis as prescribed  Please taper down pain medication over the next week following surgery.  Ideally you should not require a refill of any narcotic pain medication.  Take pain medication with food to minimize nausea. In addition to the prescribed pain medication, you may take over-the-counter pain relievers such as Tylenol .  Do NOT take additional tylenol  if your pain medication already has tylenol  in it.  Aspirin  325mg  daily per bottle instructions. Narcotic Policy: Per [facility] clinic policy, our goal is ensure optimal postoperative pain control with a multimodal pain management strategy. For all [facility] patients, our goal is to wean post-operative narcotic medications by 6 weeks post-operatively, and many times sooner. If this is not possible due to utilization of pain medication prior to surgery, your [facility] doctor will support your acute post-operative pain control for the first 6 weeks postoperatively, with a plan to transition you back to your primary pain team following that. Max Spain will work to ensure a Therapist, occupational.  FOLLOWUP INSTRUCTIONS: 1. Follow up at the Physical Therapy Clinic 3-4 days following surgery. This appointment should be scheduled unless other arrangements have been made.The Physical Therapy scheduling number is [phone] if an appointment has not already been arranged.  2. Contact Dr. Verline Glow office during office hours at  [phone] or the practice after hours line at [phone] for non-emergencies. For medical emergencies call 911.   Discharge Location: Home   Post Anesthesia Home Care Instructions  Activity: Get plenty of rest for the remainder of the day. A responsible individual must stay with you for 24 hours following the procedure.  For the next 24 hours, DO NOT: -Drive a car -Advertising copywriter -Drink alcoholic beverages -Take any medication unless instructed by your physician -Make any legal decisions or sign important papers.  Meals: Start with liquid foods such as gelatin or soup. Progress to regular foods as tolerated. Avoid greasy, spicy, heavy foods. If nausea and/or vomiting occur, drink only clear liquids until the nausea and/or vomiting subsides. Call your physician if vomiting continues.  Special Instructions/Symptoms: Your throat may feel dry or sore from the anesthesia or the breathing tube placed in your throat during surgery. If this causes discomfort, gargle with warm salt water. The discomfort should disappear within 24 hours.  Next dose of Tylenol  can be taken today at 130pm if needed.    Regional Anesthesia Blocks  1. You may not be able to move or feel the "blocked" extremity after a regional anesthetic block. This may last may last from 3-48 hours after placement, but it will go away. The length of time depends on the medication injected and your individual response to the medication. As the nerves start to wake up, you may experience tingling as the movement and feeling returns to your extremity. If the numbness and inability to move your extremity has not gone away after 48 hours, please call your surgeon.   2. The extremity that is blocked will need to be protected until the numbness is gone and the strength has returned. Because you cannot feel it, you will need to take extra care to avoid injury. Because it may be weak, you may have difficulty moving it or using it.  You may not know what position it is in without looking at it while the block is in effect.  3. For blocks in the legs and feet, returning to weight bearing and walking needs to be done carefully. You will need to wait until the numbness is entirely gone and the strength has returned. You should be able to move your leg and foot normally before you try and bear weight or walk. You will need someone to be with you when you first try to ensure you do not fall and possibly risk injury.  4. Bruising and tenderness at the needle site are common side effects and will resolve in a few days.  5. Persistent numbness or new problems with movement should be communicated to the surgeon or the [facility] [phone] [facility] [phone]).

## 2023-06-11 NOTE — Brief Op Note (Signed)
   Brief Op Note  Date of Surgery: 06/11/2023  Preoperative Diagnosis: LEFT ROTATOR CUFF ARTHROPATHY  Postoperative Diagnosis: same  Procedure: Procedure(s): ARTHROPLASTY, SHOULDER, TOTAL, REVERSE  Implants: Implant Name Type Inv. Item Serial No. Manufacturer Lot No. LRB No. Used Action  SCREW BONE INTRNL SM 7 - [id number] Screw SCREW BONE INTRNL SM 7  [employer] [id number] Left 1 Implanted  AUGMENT BASEPLATE 15DEG 25 WDG - [id number] Joint AUGMENT BASEPLATE 15DEG 25 WDG  [employer] [id number] Left 1 Implanted  SPHERE GLENOID LATERALIZED 33 - [id number] Joint SPHERE GLENOID LATERALIZED 33  [employer] [id number] Left 1 Implanted  SCREW PERIPHERAL 30 - [id number] Screw SCREW PERIPHERAL 30  [employer] ON SET Left 2 Implanted  SCREW 5.5X26 - [id number] Screw SCREW 5.5X26  [employer] ON SET Left 1 Implanted  STEM HUM PLUS SHORT SZ1+ - [id number] Stem STEM HUM PLUS SHORT SZ1+  [employer] [id number] Left 1 Implanted  INSERT REV SZ 1/2 - [id number] Insert INSERT REV SZ 1/2  [employer] [id number] Left 1 Implanted    Surgeons: Surgeon(s): Wilhelmenia Harada, MD  Anesthesia: General    Estimated Blood Loss: See anesthesia record  Complications: None  Condition to PACU: Stable  Carmina Chris, MD 06/11/2023 11:01 AM

## 2023-06-11 NOTE — Op Note (Signed)
 Date of Surgery: 06/11/2023  INDICATIONS: Diane Grant is a 73 y.o.-year-old female with left rotator cuff arthropathy.  The risk and benefits of the procedure were discussed in detail and documented in the pre-operative evaluation.   PREOPERATIVE DIAGNOSIS: 1. Left shoulder rotator cuff arthropathy  POSTOPERATIVE DIAGNOSIS: Same.  PROCEDURE: 1. Left shoulder reverse shoulder arthroplasty 2. Left shoulder biceps tenodesis  SURGEON: Carmina Chris MD  ASSISTANT: Deon Flatter, ATC  ANESTHESIA:  general plus interscalene nerve block  IV FLUIDS AND URINE: See anesthesia record.  ANTIBIOTICS: Ancef  ESTIMATED BLOOD LOSS: 25 mL.  IMPLANTS:  Implant Name Type Inv. Item Serial No. Manufacturer Lot No. LRB No. Used Action  SCREW BONE INTRNL SM 7 - [id number] Screw SCREW BONE INTRNL SM 7  [employer] [id number] Left 1 Implanted  AUGMENT BASEPLATE 15DEG 25 WDG - [id number] Joint AUGMENT BASEPLATE 15DEG 25 WDG  [employer] [id number] Left 1 Implanted  SPHERE GLENOID LATERALIZED 33 - [id number] Joint SPHERE GLENOID LATERALIZED 33  [employer] [id number] Left 1 Implanted  SCREW PERIPHERAL 30 - [id number] Screw SCREW PERIPHERAL 30  [employer] ON SET Left 2 Implanted  SCREW 5.5X26 - [id number] Screw SCREW 5.5X26  [employer] ON SET Left 1 Implanted  STEM HUM PLUS SHORT SZ1+ - [id number] Stem STEM HUM PLUS SHORT SZ1+  [employer] [id number] Left 1 Implanted  INSERT REV SZ 1/2 - [id number] Insert INSERT REV SZ 1/2  [employer] [id number] Left 1 Implanted    DRAINS: None  CULTURES: None  COMPLICATIONS: none  DESCRIPTION OF PROCEDURE:   Patient was identified in the preoperative holding area.  Anesthesia performed an interscalene nerve block after universal timeout was performed with nursing.  Ancef was given 1 hour prior to skin incision.    The surgical site was scrubbed with a chlorhexidine scrub brush and alcohol.  The patient was then prepped with chlorhexidine skin prep.  The patient  was subsequently taken back to the operating room.  Anesthesia was induced. The patient was transferred to the beachchair position.  All bony prominences were padded.  Final timeout was again performed.     The bony landmarks of the shoulder were marked with a marking pen. A delto-pectoral incision was made, extending up approximately 5 inches. The wound with then irrigated with dilute betadine. Cephalic vein was identified, and an protected. This was retracted medially. Subdeltoid and subpectoral lesions were released. Neurovascular structures were carefully protected. The Gelpi retractor was used to retract the deltoid and pectoralis major.    The deltoid was retracted laterally with a Brown humeral retractor.  The conjoined tendon was identified. The cleido-pectoral fascia was excised.  The axillary nerve was palpated and carefully protected throughout the procedure. The biceps tendon was found and tenodesed to the upper pec with # 2 Ethibond non-absorbable suture.  Proximally the biceps tendon was removed up to the joint.  The bicipital groove was used for a landmark to establish rotator cuff interval. The subscap was tagged with a #2 Ethibond.  At this point the subscapularis was peeled off from the lesser tuberosity with care to avoid dissection distally in order to protect the axillary nerve.  Once the joint was exposed the proximal humerus was delivered with external rotation and extension of the arm. The humerus was prepped initially by performing a humeral neck cut. This was done with the guide using 30 degrees of retroversion as a reference.  The head portion was removed.  A medullary sounding reamer was then used.  We subsequently placed our guidewire through the center of the humeral head using the reference guide.  This was a size 1.  Metaphyseal reamer was then used.  Finally the size 1 broach was malleted into place with excellent purchase.  A tonsil clamp was used to attempt to pull this out  with very good purchase   Attention was then turned to the glenoid.  Posteriorly a large Darach retractor was used.  A 360 Degree release of the subscapularis and glenoid were done. The capsule was released from the humerus.    Glenoid retractors were placed posteriorly, superiorly behind the biceps tendon and anteriorly on the glenoid neck. A 360-degree release of the capsule was performed with cautery.  The triceps was released off the inferior tubercle of the glenoid. The axillary nerve was carefully protected with the surgeon's index finger, retracting it and using cautery.   A guidepin was placed through the glenoid guide. The guidepin was drilled until it exited the cortex. The guidepin was over drilled. Next, the glenoid was prepared with the reamer down to cortical bone.  The central peg hole was totally within the scapular neck tested with the probe.  The baseplate was then placed screwed securely with good purchase in position and then secured with 4 screws. In each case, they were drilled and measured and the appropriate length screw placed with excellent rigid fixation of the baseplate. The glenosphere was placed with size based based on pre-operative templating.   The humerus was then delivered and a neutral polyethylene trial was placed.  This was brought to just the level of the reduction but not completely reduced.  A 98final poly was selected and impacted.    Appropriate tension was noted on the conjoined tendon and deltoid muscle.  Extension was stable, external and internal rotation as well.  The subscap was pulled over but as this was not able to reach comfortably decision was made not to repair in order to prevent limited in external rotation.  The wound was then irrigated. Vancomycin powder was placed in the wound again for infection prevention.   The wound was then closed in layers with 0 Vicryl interrupted in the deep subcu followed by 2-0 Vicryl in the superficial subcu and 3-0  nylon for skin.  An Aquacel dressing was applied as well as an Veterinary surgeon.  A shoulder immobilizer was applied.     Postoperative Plan: -The patient will begin the reverse shoulder rehab protocol  -Aspirin  325 mg daily will be used for 4 weeks for blood clot prevention -I will see the patient back in 2 weeks for first postoperative wound check     Carmina Chris, MD 11:01 AM

## 2023-06-11 NOTE — Anesthesia Procedure Notes (Signed)
 Anesthesia Regional Block: Interscalene brachial plexus block   Pre-Anesthetic Checklist: , timeout performed,  Correct Patient, Correct Site, Correct Laterality,  Correct Procedure, Correct Position, site marked,  Risks and benefits discussed,  Surgical consent,  Pre-op evaluation,  At surgeon's request and post-op pain management  Laterality: Left  Prep: Maximum Sterile Barrier Precautions used, chloraprep       Needles:  Injection technique: Single-shot  Needle Type: Echogenic Stimulator Needle     Needle Length: 9cm  Needle Gauge: 22     Additional Needles:   Procedures:,,,, ultrasound used (permanent image in chart),,    Narrative:  Start time: 06/11/2023 8:15 AM End time: 06/11/2023 8:20 AM Injection made incrementally with aspirations every 5 mL.  Performed by: Personally  Anesthesiologist: Jacquelyne Matte, DO  Additional Notes: Monitors applied. No increased pain on injection. No increased resistance to injection. Injection made in 5cc increments. Good needle visualization. Patient tolerated procedure well.

## 2023-06-11 NOTE — Anesthesia Procedure Notes (Signed)
 Procedure Name: Intubation Date/Time: 06/11/2023 9:52 AM  Performed by: Leverne Reading, CRNAPre-anesthesia Checklist: Patient identified, Emergency Drugs available, Suction available and Patient being monitored Patient Re-evaluated:Patient Re-evaluated prior to induction Oxygen Delivery Method: Circle system utilized Preoxygenation: Pre-oxygenation with 100% oxygen Induction Type: IV induction Ventilation: Mask ventilation without difficulty Laryngoscope Size: Miller and 3 Grade View: Grade I Tube type: Oral Tube size: 7.0 mm Number of attempts: 1 Airway Equipment and Method: Stylet and Oral airway Placement Confirmation: ETT inserted through vocal cords under direct vision, positive ETCO2 and breath sounds checked- equal and bilateral Secured at: 19 cm Tube secured with: Tape Dental Injury: Teeth and Oropharynx as per pre-operative assessment

## 2023-06-11 NOTE — Transfer of Care (Signed)
 Immediate Anesthesia Transfer of Care Note  Patient: Diane Grant  Procedure(s) Performed: ARTHROPLASTY, SHOULDER, TOTAL, REVERSE (Left: Shoulder)  Patient Location: PACU  Anesthesia Type:GA combined with regional for post-op pain  Level of Consciousness: drowsy, patient cooperative, and responds to stimulation  Airway & Oxygen Therapy: Patient Spontanous Breathing and Patient connected to face mask oxygen  Post-op Assessment: Report given to RN and Post -op Vital signs reviewed and stable  Post vital signs: Reviewed and stable  Last Vitals:  Vitals Value Taken Time  BP 138/79 06/11/23 1109  Temp    Pulse 76 06/11/23 1110  Resp 15 06/11/23 1110  SpO2 99 % 06/11/23 1110  Vitals shown include unfiled device data.  Last Pain:  Vitals:   06/11/23 0733  TempSrc: Temporal  PainSc: 0-No pain      Patients Stated Pain Goal: 3 (06/11/23 0733)  Complications: No notable events documented.

## 2023-06-11 NOTE — H&P (Signed)
 Expand All Collapse All       Chief Complaint: Bilateral shoulder pain        History of Present Illness:    04/26/2023: Presents today for follow-up of her left shoulder as well as MRI discussion   Diane Grant is a 73 y.o. female presents today with a history of bilateral shoulder pain.  She has previously had a rotator cuff repair done on the right side many years prior.  This was done in [facility].  She did previously have injections following this.  Since that time she has had recurrence of her pain particular with overhead activity.  She is right-hand dominant.  She does enjoy gardening.  She states the left shoulder does feel quite similar to the contralateral shoulder.       PMH/PSH/Family History/Social History/Meds/Allergies:         Past Medical History:  Diagnosis Date   Allergy      Degenerative disc disease, lumbar     Hypertension     Thyroid  disease      hypothyroidism   Ulcer               Past Surgical History:  Procedure Laterality Date   ABDOMINAL HYSTERECTOMY       ROTATOR CUFF REPAIR   11/30/2015    Right shoulder        Social History         Socioeconomic History   Marital status: Married      Spouse name: Royston Cornea   Number of children: 5   Years of education: 12   Highest education level: 12th grade  Occupational History      Employer: retired  Tobacco Use   Smoking status: Never      Passive exposure: Current (husband smokes 50+ years)   Smokeless tobacco: Never  Vaping Use   Vaping status: Never Used  Substance and Sexual Activity   Alcohol use: No   Drug use: No   Sexual activity: Not Currently      Birth control/protection: None  Other Topics Concern   Not on file  Social History Narrative    Lives with husband. Has 3 daughters and 2 step-daughters - all live within an hour away    Social Drivers of Health        Financial Resource Strain: Patient Declined (02/03/2023)    Overall Financial  Resource Strain (CARDIA)     Difficulty of Paying Living Expenses: Patient declined  Food Insecurity: Low Risk  (04/24/2023)    Received from [facility]    Hunger Vital Sign     Worried About Running Out of Food in the Last Year: Never true     Ran Out of Food in the Last Year: Never true  Transportation Needs: No Transportation Needs (04/24/2023)    Received from [employer]     In the past 12 months, has lack of reliable transportation kept you from medical appointments, meetings, work or from getting things needed for daily living? : No  Physical Activity: Unknown (02/03/2023)    Exercise Vital Sign     Days of Exercise per Week: Patient declined     Minutes of Exercise per Session: 30 min  Stress: Patient Declined (02/03/2023)    [employer] of Occupational Health - Occupational Stress Questionnaire     Feeling of Stress : Patient declined  Social Connections: Unknown (02/03/2023)    Social Connection and Isolation Panel [NHANES]  Frequency of Communication with Friends and Family: Patient declined     Frequency of Social Gatherings with Friends and Family: Patient declined     Attends Religious Services: Patient declined     Database administrator or Organizations: Patient declined     Attends Engineer, structural: More than 4 times per year     Marital Status: Patient declined         Family History  Problem Relation Age of Onset   Arthritis Mother     Heart attack Father          50s MI   Heart disease Father     Allergic rhinitis Sister     Asthma Sister     Allergic rhinitis Sister     Allergic rhinitis Brother     Heart disease Brother     Cancer Brother          liver   Asthma Maternal Grandmother     Heart attack Maternal Grandmother     Breast cancer Maternal Grandmother     Healthy Daughter     Healthy Daughter     Healthy Daughter     Healthy Daughter     Healthy Daughter     Food Allergy  Niece     Thyroid  cancer  Neg Hx     Colon cancer Neg Hx     Prostate cancer Neg Hx     Ovarian cancer Neg Hx     Angioedema Neg Hx     Eczema Neg Hx     Atopy Neg Hx     Immunodeficiency Neg Hx          Allergies       Allergies  Allergen Reactions   Anesthesia S-I-40 [Propofol] Nausea And Vomiting      Per patient need to be careful when giving this            Current Outpatient Medications  Medication Sig Dispense Refill   albuterol  (VENTOLIN  HFA) 108 (90 Base) MCG/ACT inhaler Inhale 2 puffs into the lungs every 4 (four) hours as needed for wheezing or shortness of breath (coughing fits). 18 g 1   budesonide -formoterol  (SYMBICORT ) 80-4.5 MCG/ACT inhaler Inhale 2 puffs into the lungs 2 (two) times daily. 1 each 11   celecoxib  (CELEBREX ) 100 MG capsule TAKE 1 CAPSULE BY MOUTH TWICE A DAY 60 capsule 0   Cholecalciferol (VITAMIN D-3 PO) Take by mouth.       dicyclomine (BENTYL) 20 MG tablet Take 20 mg by mouth 3 (three) times daily.       doxepin (SINEQUAN) 10 MG capsule Take 10 mg by mouth at bedtime as needed.       fluticasone  (FLONASE ) 50 MCG/ACT nasal spray Place 2 sprays into both nostrils daily. 16 g 6   hydrOXYzine  (ATARAX ) 10 MG tablet Take 10 mg by mouth 3 (three) times daily as needed for itching.       levothyroxine  (EUTHYROX ) 75 MCG tablet Take 1 tablet (75 mcg total) by mouth daily before breakfast. 100 tablet 3   linaclotide (LINZESS) 72 MCG capsule Take 72 mcg by mouth daily before breakfast.       losartan -hydrochlorothiazide  (HYZAAR) 100-25 MG tablet Take 1 tablet by mouth daily. 90 tablet 3   meclizine  (ANTIVERT ) 25 MG tablet Take 25 mg by mouth 3 (three) times daily as needed for dizziness.       methocarbamol  (ROBAXIN ) 500 MG tablet Take 1 tablet (500 mg total) by  mouth 4 (four) times daily. 40 tablet 0   montelukast  (SINGULAIR ) 10 MG tablet Take 1 tablet (10 mg total) by mouth at bedtime. 90 tablet 3   omeprazole  (PRILOSEC) 40 MG capsule Take 1 capsule (40 mg total) by mouth daily. 90  capsule 1   rosuvastatin  (CRESTOR ) 10 MG tablet Take 1 tablet (10 mg total) by mouth at bedtime. For cholesterol 90 tablet 3   Spacer/Aero-Holding Chambers (AEROCHAMBER PLUS) inhaler Use as instructed 1 each 2      No current facility-administered medications for this visit.      Imaging Results (Last 48 hours)  No results found.     Review of Systems:   A ROS was performed including pertinent positives and negatives as documented in the HPI.   Physical Exam :   Constitutional: NAD and appears stated age Neurological: Alert and oriented Psych: Appropriate affect and cooperative There were no vitals taken for this visit.    Comprehensive Musculoskeletal Exam:     Tenderness about bilateral glenohumeral joints and lateral deltoids.  Range of motion is 160 degrees bilaterally with external rotation at side 30 degrees bilaterally.  Internal rotation is L5 bilaterally..  Negative belly press, 4 out of 5 with forward elevation.  Positive Neer impingement     Imaging:   Xray (3 views right shoulder, 3 views left shoulder): Mild osteoarthritis right shoulder with narrowing of the acromiohumeral interval     MRI left shoulder: Nearly full-thickness rotator cuff injury with chondral thinning consistent with glenohumeral osteoarthritis   I personally reviewed and interpreted the radiographs.     Assessment and Plan:   73 y.o. female with left shoulder rotator cuff tearing in the setting of glenohumeral osteoarthritis.  At this time she has had a previous injection which only gave her 3 days of relief.  As result she is hoping to defer this.  Time she has trialed strengthening of the left shoulder without any improvement.  Given this I did discuss the possibility of reverse shoulder arthroplasty on the left.  I did discuss the risks and limitations as well as the associated complications.  After discussion she has elected for this   -Plan for left shoulder reverse shoulder arthroplasty        After a lengthy discussion of treatment options, including risks, benefits, alternatives, complications of surgical and nonsurgical conservative options, the patient elected surgical repair.    The patient  is aware of the material risks  and complications including, but not limited to injury to adjacent structures, neurovascular injury, infection, numbness, bleeding, implant failure, thermal burns, stiffness, persistent pain, failure to heal, disease transmission from allograft, need for further surgery, dislocation, anesthetic risks, blood clots, risks of death,and others. The probabilities of surgical success and failure discussed with patient given their particular co-morbidities.The time and nature of expected rehabilitation and recovery was discussed.The patient's questions were all answered preoperatively.  No barriers to understanding were noted. I explained the natural history of the disease process and Rx rationale.  I explained to the patient what I considered to be reasonable expectations given their personal situation.  The final treatment plan was arrived at through a shared patient decision making process model.           I personally saw and evaluated the patient, and participated in the management and treatment plan.   Wilhelmenia Harada, MD Attending Physician, Orthopedic Surgery   This document was dictated using Dragon voice recognition software. A reasonable attempt at proof reading  has been made to minimize errors.

## 2023-06-11 NOTE — Anesthesia Postprocedure Evaluation (Signed)
 Anesthesia Post Note  Patient: Diane Grant  Procedure(s) Performed: ARTHROPLASTY, SHOULDER, TOTAL, REVERSE (Left: Shoulder)     Patient location during evaluation: PACU Anesthesia Type: Regional and General Level of consciousness: awake and alert, oriented and patient cooperative Pain management: pain level controlled Vital Signs Assessment: post-procedure vital signs reviewed and stable Respiratory status: spontaneous breathing, nonlabored ventilation and respiratory function stable Cardiovascular status: blood pressure returned to baseline and stable Postop Assessment: no apparent nausea or vomiting Anesthetic complications: no   No notable events documented.  Last Vitals:  Vitals:   06/11/23 1115 06/11/23 1130  BP: 124/80 132/73  Pulse: 78 70  Resp: 15 15  Temp:    SpO2: 99% 96%    Last Pain:  Vitals:   06/11/23 1109  TempSrc:   PainSc: 0-No pain                 Jacquelyne Matte

## 2023-06-11 NOTE — Progress Notes (Signed)
Assisted Dr. Doroteo Glassman with left, interscalene , ultrasound guided block. Side rails up, monitors on throughout procedure. See vital signs in flow sheet. Tolerated Procedure well.

## 2023-06-12 ENCOUNTER — Encounter (HOSPITAL_BASED_OUTPATIENT_CLINIC_OR_DEPARTMENT_OTHER): Payer: Self-pay | Admitting: Orthopaedic Surgery

## 2023-06-14 ENCOUNTER — Ambulatory Visit: Attending: Orthopaedic Surgery

## 2023-06-14 DIAGNOSIS — M75122 Complete rotator cuff tear or rupture of left shoulder, not specified as traumatic: Secondary | ICD-10-CM | POA: Diagnosis not present

## 2023-06-14 DIAGNOSIS — M25612 Stiffness of left shoulder, not elsewhere classified: Secondary | ICD-10-CM | POA: Diagnosis not present

## 2023-06-14 DIAGNOSIS — M25512 Pain in left shoulder: Secondary | ICD-10-CM | POA: Diagnosis not present

## 2023-06-14 NOTE — Therapy (Signed)
 OUTPATIENT PHYSICAL THERAPY SHOULDER EVALUATION   Patient Name: Diane Grant MRN: [id number] DOB:August 20, 1950, 73 y.o., female Today's Date: 06/14/2023  END OF SESSION:  PT End of Session - 06/14/23 1017     Visit Number 1    Number of Visits 12    Date for PT Re-Evaluation 08/30/23    PT Start Time 1019    PT Stop Time 1100    PT Time Calculation (min) 41 min    Activity Tolerance Patient tolerated treatment well    Behavior During Therapy WFL for tasks assessed/performed             Past Medical History:  Diagnosis Date   Allergy     Asthma    Degenerative disc disease, lumbar    GERD (gastroesophageal reflux disease)    Hypertension    Hypothyroidism    Thyroid  disease    hypothyroidism   Ulcer    Past Surgical History:  Procedure Laterality Date   ABDOMINAL HYSTERECTOMY     DILATION AND CURETTAGE OF UTERUS     REVERSE SHOULDER ARTHROPLASTY Left 06/11/2023   Procedure: ARTHROPLASTY, SHOULDER, TOTAL, REVERSE;  Surgeon: Wilhelmenia Harada, MD;  Location: [facility];  Service: Orthopedics;  Laterality: Left;   ROTATOR CUFF REPAIR  11/30/2015   Right shoulder   Patient Active Problem List   Diagnosis Date Noted   Nontraumatic complete tear of left rotator cuff 06/11/2023   Moderate persistent asthma without complication 04/24/2023   Laryngopharyngeal reflux (LPR) 04/24/2023   Allergy  to alpha-gal 04/24/2023   Second hand tobacco smoke exposure 06/15/2021   Other allergic rhinitis 06/15/2021   Other adverse food reactions, not elsewhere classified, subsequent encounter 06/15/2021   Irritable bowel syndrome with constipation 11/16/2020   Cough 03/29/2020   Acute pharyngitis 03/29/2020   Urticaria 11/27/2019   Palpitations 06/16/2018   Chest pain of uncertain etiology 06/16/2018   Shortness of breath 06/16/2018   Educated about COVID-19 virus infection 06/16/2018   Change in voice 03/08/2017   Degenerative disc disease, lumbar 03/08/2017    Hypothyroidism 06/26/2013   Essential hypertension 06/26/2013    PCP: Manette Section, MD  REFERRING PROVIDER: Wilhelmenia Harada, MD   REFERRING DIAG: Nontraumatic complete tear of left rotator cuff   THERAPY DIAG:  Acute pain of left shoulder  Stiffness of left shoulder, not elsewhere classified  Rationale for Evaluation and Treatment: Rehabilitation  ONSET DATE: 06/11/23  SUBJECTIVE:                                                                                                                                                                                      SUBJECTIVE STATEMENT: Patient  reports that she had a reverse total shoulder replacement on 06/11/23. She notes that it has been "horrible" since surgery as she is having pain down to left forearm, but numbness from her forearm to her fingers. She has been wearing her sling at all times. She has been using ice and ibuprofen for her pain.  Hand dominance: Right  PERTINENT HISTORY: Hypertension, asthma, and allergies  PAIN:  Are you having pain? Yes: NPRS scale: Current: 8-9/10 Best: 6/10 Worst: 10/10 Pain location: left shoulder and arm Pain description: constant aching and throbbing Aggravating factors: coughing Relieving factors: ice and medication   PRECAUTIONS: Shoulder  RED FLAGS: None   WEIGHT BEARING RESTRICTIONS: NWB on LUE  FALLS:  Has patient fallen in last 6 months? No  LIVING ENVIRONMENT: Lives with: lives with their family Lives in: House/apartment Has following equipment at home: sling with abduction pillow  OCCUPATION: Retired   PLOF: Independent  PATIENT GOALS:be able garden and working outside, reduced pain, and improved mobility  NEXT MD VISIT: 06/21/23  OBJECTIVE:  Note: Objective measures were completed at Evaluation unless otherwise noted.  DIAGNOSTIC FINDINGS: 06/11/23 left shoulder x-ray IMPRESSION: Baseline radiograph status post left reverse shoulder arthroplasty.  PATIENT  SURVEYS:  Quick Dash 95.45  COGNITION: Overall cognitive status: Within functional limits for tasks assessed     SENSATION: Patient reports numbness in her left forearm and hand  UPPER EXTREMITY ROM:   Active ROM Right eval Left Eval (PROM)  Shoulder flexion 126 60  Shoulder extension    Shoulder abduction 126 55  Shoulder adduction    Shoulder internal rotation    Shoulder external rotation 30 5  Elbow flexion    Elbow extension    Wrist flexion    Wrist extension    Wrist ulnar deviation    Wrist radial deviation    Wrist pronation    Wrist supination    (Blank rows = not tested)  UPPER EXTREMITY MMT: not tested due to surgical condition  SHOULDER SPECIAL TESTS: Not tested due to surgical condition  PALPATION:  TTP: left upper trapezius, supraspinatus, infraspinatus, deltoid, biceps, and triceps                                                                                                                             TREATMENT DATE:  Modalities: no redness or adverse reaction to today's modalities  Date: 06/14/23 Vaso: Shoulder, 34 degrees; low pressure, 4 mins, Pain  PATIENT EDUCATION: Education details: POC, healing, prognosis, anatomy, x-ray results, and goal for physical therapy Person educated: Patient Education method: Explanation Education comprehension: verbalized understanding  HOME EXERCISE PROGRAM:   ASSESSMENT:  CLINICAL IMPRESSION: Patient is a 73 y.o. female who was seen today for physical therapy evaluation and treatment following a left reverse total shoulder arthroplasty on 06/11/23. She presented with high pain severity and irritability with palpation to her left shoulder and passive range of motion being the most aggravating to her familiar symptoms.  Recommend that she continue with skilled physical therapy to address her impairments to return to her prior level of function.    OBJECTIVE IMPAIRMENTS: decreased activity tolerance, decreased  ROM, decreased strength, hypomobility, impaired sensation, impaired tone, impaired UE functional use, and pain.   ACTIVITY LIMITATIONS: carrying, lifting, sleeping, bathing, dressing, reach over head, and hygiene/grooming  PARTICIPATION LIMITATIONS: meal prep, cleaning, laundry, shopping, community activity, and yard work  PERSONAL FACTORS: Age, Past/current experiences, Time since onset of injury/illness/exacerbation, Transportation, and 3+ comorbidities: Hypertension, asthma, and allergies are also affecting patient's functional outcome.   REHAB POTENTIAL: Good  CLINICAL DECISION MAKING: Evolving/moderate complexity  EVALUATION COMPLEXITY: Moderate   GOALS: Goals reviewed with patient? Yes  SHORT TERM GOALS: Target date: 07/05/23  Patient will be independent with her initial HEP.  Baseline: Goal status: INITIAL  2.  Patient will be able to demonstrate at least 90 degrees of left shoulder flexion for improved function reaching.  Baseline:  Goal status: INITIAL  3.  Patient will be able to demonstrate at least 90 degrees of left shoulder abduction for improved function reaching.  Baseline:  Goal status: INITIAL  4.  Patient will improve her QuickDASH score to 75 or less for improved perceived function with her daily activities.  Baseline:  Goal status: INITIAL  LONG TERM GOALS: Target date: 07/26/23  Patient will be independent with her advanced HEP.  Baseline:  Goal status: INITIAL  2.  Patient will be able to demonstrate at least 120 degrees of active left shoulder flexion for improved function reaching overhead.  Baseline:  Goal status: INITIAL  3.  Patient will be able to demonstrate at least 120 degrees of active left shoulder abduction for improved function reaching overhead.  Baseline:  Goal status: INITIAL  4.  Patient will be able to carry at least 5 pounds in her left hand for improved function gardening.  Baseline:  Goal status: INITIAL  5.  Patient will  improve her QuickDASH score to 50 or less for improved perceived function with her daily activities.  Baseline:  Goal status: INITIAL  PLAN:  PT FREQUENCY: 2x/week  PT DURATION: 6 weeks  PLANNED INTERVENTIONS: 97164- PT Re-evaluation, 97750- Physical Performance Testing, 97110-Therapeutic exercises, 97530- Therapeutic activity, [id number]- Neuromuscular re-education, 97535- Self Care, 16109- Manual therapy, [id number]- Electrical stimulation (unattended), 97016- Vasopneumatic device, Patient/Family education, Joint mobilization, Cryotherapy, and Moist heat  PLAN FOR NEXT SESSION: PROM, manual therapy, pendulums, scapular retraction, and modalities as needed   Lane Pinon, PT 06/14/2023, 12:21 PM

## 2023-06-17 ENCOUNTER — Ambulatory Visit

## 2023-06-17 DIAGNOSIS — M25512 Pain in left shoulder: Secondary | ICD-10-CM

## 2023-06-17 DIAGNOSIS — M25612 Stiffness of left shoulder, not elsewhere classified: Secondary | ICD-10-CM | POA: Diagnosis not present

## 2023-06-17 DIAGNOSIS — M75122 Complete rotator cuff tear or rupture of left shoulder, not specified as traumatic: Secondary | ICD-10-CM | POA: Diagnosis not present

## 2023-06-17 NOTE — Therapy (Signed)
 OUTPATIENT PHYSICAL THERAPY SHOULDER EVALUATION   Patient Name: Diane Grant MRN: [id number] DOB:1950-04-01, 73 y.o., female Today's Date: 06/17/2023  END OF SESSION:  PT End of Session - 06/17/23 1106     Visit Number 2    Number of Visits 12    Date for PT Re-Evaluation 08/30/23    PT Start Time 1100    PT Stop Time 1142    PT Time Calculation (min) 42 min    Activity Tolerance Patient tolerated treatment well    Behavior During Therapy WFL for tasks assessed/performed              Past Medical History:  Diagnosis Date   Allergy     Asthma    Degenerative disc disease, lumbar    GERD (gastroesophageal reflux disease)    Hypertension    Hypothyroidism    Thyroid  disease    hypothyroidism   Ulcer    Past Surgical History:  Procedure Laterality Date   ABDOMINAL HYSTERECTOMY     DILATION AND CURETTAGE OF UTERUS     REVERSE SHOULDER ARTHROPLASTY Left 06/11/2023   Procedure: ARTHROPLASTY, SHOULDER, TOTAL, REVERSE;  Surgeon: Wilhelmenia Harada, MD;  Location: [facility];  Service: Orthopedics;  Laterality: Left;   ROTATOR CUFF REPAIR  11/30/2015   Right shoulder   Patient Active Problem List   Diagnosis Date Noted   Nontraumatic complete tear of left rotator cuff 06/11/2023   Moderate persistent asthma without complication 04/24/2023   Laryngopharyngeal reflux (LPR) 04/24/2023   Allergy  to alpha-gal 04/24/2023   Second hand tobacco smoke exposure 06/15/2021   Other allergic rhinitis 06/15/2021   Other adverse food reactions, not elsewhere classified, subsequent encounter 06/15/2021   Irritable bowel syndrome with constipation 11/16/2020   Cough 03/29/2020   Acute pharyngitis 03/29/2020   Urticaria 11/27/2019   Palpitations 06/16/2018   Chest pain of uncertain etiology 06/16/2018   Shortness of breath 06/16/2018   Educated about COVID-19 virus infection 06/16/2018   Change in voice 03/08/2017   Degenerative disc disease, lumbar  03/08/2017   Hypothyroidism 06/26/2013   Essential hypertension 06/26/2013    PCP: Manette Section, MD  REFERRING PROVIDER: Wilhelmenia Harada, MD   REFERRING DIAG: Nontraumatic complete tear of left rotator cuff   THERAPY DIAG:  Acute pain of left shoulder  Stiffness of left shoulder, not elsewhere classified  Rationale for Evaluation and Treatment: Rehabilitation  ONSET DATE: 06/11/23  SUBJECTIVE:                                                                                                                                                                                      SUBJECTIVE STATEMENT:  Patient reports that she forgot to take her pain medication earlier today so she is hurting a little more.  Hand dominance: Right  PERTINENT HISTORY: Hypertension, asthma, and allergies  PAIN:  Are you having pain? Yes: NPRS scale: Current: 4-5/10 Best: 6/10 Worst: 10/10 Pain location: left shoulder and arm Pain description: constant aching and throbbing Aggravating factors: coughing Relieving factors: ice and medication   PRECAUTIONS: Shoulder  RED FLAGS: None   WEIGHT BEARING RESTRICTIONS: NWB on LUE  FALLS:  Has patient fallen in last 6 months? No  LIVING ENVIRONMENT: Lives with: lives with their family Lives in: House/apartment Has following equipment at home: sling with abduction pillow  OCCUPATION: Retired   PLOF: Independent  PATIENT GOALS:be able garden and working outside, reduced pain, and improved mobility  NEXT MD VISIT: 06/21/23  OBJECTIVE:  Note: Objective measures were completed at Evaluation unless otherwise noted.  DIAGNOSTIC FINDINGS: 06/11/23 left shoulder x-ray IMPRESSION: Baseline radiograph status post left reverse shoulder arthroplasty.  PATIENT SURVEYS:  Quick Dash 95.45  COGNITION: Overall cognitive status: Within functional limits for tasks assessed     SENSATION: Patient reports numbness in her left forearm and hand  UPPER  EXTREMITY ROM:   Active ROM Right eval Left Eval (PROM)  Shoulder flexion 126 60  Shoulder extension    Shoulder abduction 126 55  Shoulder adduction    Shoulder internal rotation    Shoulder external rotation 30 5  Elbow flexion    Elbow extension    Wrist flexion    Wrist extension    Wrist ulnar deviation    Wrist radial deviation    Wrist pronation    Wrist supination    (Blank rows = not tested)  UPPER EXTREMITY MMT: not tested due to surgical condition  SHOULDER SPECIAL TESTS: Not tested due to surgical condition  PALPATION:  TTP: left upper trapezius, supraspinatus, infraspinatus, deltoid, biceps, and triceps                                                                                                                             TREATMENT DATE:                                    06/17/23 EXERCISE LOG  Exercise Repetitions and Resistance Comments  Towel squeeze 20 reps  50% max effort  Elbow flexion  20 reps   Wrist flexion/extension 2 minutes   Elbow hang  5 minutes For elbow extension  Manual therapy  See below    Blank cell = exercise not performed today  Manual Therapy Soft Tissue Mobilization: left biceps, triceps, infraspinatus, and deltoid, for reduced pain and tone Passive ROM: left shoulder flexion, abduction, and ER along with left elbow flexion and extension, to tolerance within protocol   PATIENT EDUCATION: Education details: POC, prognosis, and healing Person educated: Patient Education method: Explanation Education comprehension: verbalized understanding  HOME EXERCISE PROGRAM:   ASSESSMENT:  CLINICAL IMPRESSION: Patient was introduced to multiple new active and passive interventions for improved left shoulder mobility. She required minimal cueing with today's new interventions for proper exercise performance. She was educated on healing following a reverse total shoulder arthroplasty and she reported understanding. Manual therapy focused  on left shoulder passive range of motion and soft tissue mobilization with good results. She reported that her shoulder felt "worked" but "better" upon the conclusion of treatment. She continues to require skilled physical therapy to address her remaining impairments to return to her prior level of function.   OBJECTIVE IMPAIRMENTS: decreased activity tolerance, decreased ROM, decreased strength, hypomobility, impaired sensation, impaired tone, impaired UE functional use, and pain.   ACTIVITY LIMITATIONS: carrying, lifting, sleeping, bathing, dressing, reach over head, and hygiene/grooming  PARTICIPATION LIMITATIONS: meal prep, cleaning, laundry, shopping, community activity, and yard work  PERSONAL FACTORS: Age, Past/current experiences, Time since onset of injury/illness/exacerbation, Transportation, and 3+ comorbidities: Hypertension, asthma, and allergies are also affecting patient's functional outcome.   REHAB POTENTIAL: Good  CLINICAL DECISION MAKING: Evolving/moderate complexity  EVALUATION COMPLEXITY: Moderate   GOALS: Goals reviewed with patient? Yes  SHORT TERM GOALS: Target date: 07/05/23  Patient will be independent with her initial HEP.  Baseline: Goal status: INITIAL  2.  Patient will be able to demonstrate at least 90 degrees of left shoulder flexion for improved function reaching.  Baseline:  Goal status: INITIAL  3.  Patient will be able to demonstrate at least 90 degrees of left shoulder abduction for improved function reaching.  Baseline:  Goal status: INITIAL  4.  Patient will improve her QuickDASH score to 75 or less for improved perceived function with her daily activities.  Baseline:  Goal status: INITIAL  LONG TERM GOALS: Target date: 07/26/23  Patient will be independent with her advanced HEP.  Baseline:  Goal status: INITIAL  2.  Patient will be able to demonstrate at least 120 degrees of active left shoulder flexion for improved function reaching  overhead.  Baseline:  Goal status: INITIAL  3.  Patient will be able to demonstrate at least 120 degrees of active left shoulder abduction for improved function reaching overhead.  Baseline:  Goal status: INITIAL  4.  Patient will be able to carry at least 5 pounds in her left hand for improved function gardening.  Baseline:  Goal status: INITIAL  5.  Patient will improve her QuickDASH score to 50 or less for improved perceived function with her daily activities.  Baseline:  Goal status: INITIAL  PLAN:  PT FREQUENCY: 2x/week  PT DURATION: 6 weeks  PLANNED INTERVENTIONS: 97164- PT Re-evaluation, 97750- Physical Performance Testing, 97110-Therapeutic exercises, 97530- Therapeutic activity, [id number]- Neuromuscular re-education, 97535- Self Care, [id number]- Manual therapy, [id number]- Electrical stimulation (unattended), 97016- Vasopneumatic device, Patient/Family education, Joint mobilization, Cryotherapy, and Moist heat  PLAN FOR NEXT SESSION: PROM, manual therapy, pendulums, scapular retraction, and modalities as needed   Lane Pinon, PT 06/17/2023, 12:00 PM

## 2023-06-21 ENCOUNTER — Ambulatory Visit (HOSPITAL_BASED_OUTPATIENT_CLINIC_OR_DEPARTMENT_OTHER)

## 2023-06-21 ENCOUNTER — Ambulatory Visit (HOSPITAL_BASED_OUTPATIENT_CLINIC_OR_DEPARTMENT_OTHER): Admitting: Family

## 2023-06-21 ENCOUNTER — Encounter (HOSPITAL_BASED_OUTPATIENT_CLINIC_OR_DEPARTMENT_OTHER): Payer: Self-pay | Admitting: Family

## 2023-06-21 ENCOUNTER — Ambulatory Visit (HOSPITAL_BASED_OUTPATIENT_CLINIC_OR_DEPARTMENT_OTHER): Admitting: Orthopaedic Surgery

## 2023-06-21 VITALS — BP 118/62 | HR 93 | Ht 63.0 in | Wt 148.0 lb

## 2023-06-21 DIAGNOSIS — E7800 Pure hypercholesterolemia, unspecified: Secondary | ICD-10-CM

## 2023-06-21 DIAGNOSIS — R0789 Other chest pain: Secondary | ICD-10-CM | POA: Diagnosis not present

## 2023-06-21 DIAGNOSIS — I10 Essential (primary) hypertension: Secondary | ICD-10-CM | POA: Diagnosis not present

## 2023-06-21 DIAGNOSIS — M75122 Complete rotator cuff tear or rupture of left shoulder, not specified as traumatic: Secondary | ICD-10-CM | POA: Diagnosis not present

## 2023-06-21 DIAGNOSIS — R609 Edema, unspecified: Secondary | ICD-10-CM | POA: Diagnosis not present

## 2023-06-21 DIAGNOSIS — Z96612 Presence of left artificial shoulder joint: Secondary | ICD-10-CM | POA: Diagnosis not present

## 2023-06-21 NOTE — Progress Notes (Signed)
  Cardiology Office Note:  .   Date:  06/22/2023  ID:  Diane Grant, DOB [birth year], MRN [id number] PCP: Diane Section, MD  [facility] Providers Cardiologist:  Diane Donning, MD    History of Present Illness: .   Diane Grant is a 73 y.o. female with history of atypical chest pain, hypertension, hyperlipidemia.  Prior ETT 06/22/20 for atypical chest pain with no ischemia.  Last seen 07/27/21 with musculosketal chest pain which was tender on palpation.  She was recommended follow-up as needed with cardiology.  She had EKG 06/05/2023 as part of preoperative workup with sinus bradycardia 58 bpm with no acute ST/T wave changes.  Underwent shoulder arthroplasty 06/10/24.  Presents today for follow-up with family member.  Reports feeling overall well since her shoulder surgery with pain gradually in proving in has established with PT.Reports no shortness of breath nor dyspnea on exertion. Reports no chest pain, pressure, or tightness. No edema, orthopnea, PND. Reports no palpitations.  We reviewed the range for normal heart rate and concern for bradycardia only if symptomatic.  ROS: Please see the history of present illness.    All other systems reviewed and are negative.   Studies Reviewed: Diane Grant   EKG Interpretation Date/Time:  Friday Jun 21 2023 14:16:22 EDT Ventricular Rate:  97 PR Interval:  156 QRS Duration:  66 QT Interval:  332 QTC Calculation: 421 R Axis:   15  Text Interpretation: Normal sinus rhythm Normal ECG Confirmed by Diane Grant ([id number]) on 06/21/2023 2:17:45 PM    Cardiac Studies & Procedures   ______________________________________________________________________________________________   STRESS TESTS  EXERCISE TOLERANCE TEST (ETT) 06/22/2020  Narrative  Blood pressure demonstrated a normal response to exercise.  Upsloping ST segment depression ST segment depression of 2 mm was noted during stress in the II, III, aVF, V5,  V6 and V4 leads, and returning to baseline after 1-5 minutes of recovery.  Abnormal ETT with 2 mm upsloping ST depression in inferior lateral leads Suggest f/u perfusion study or CTA            ______________________________________________________________________________________________      Risk Assessment/Calculations:             Physical Exam:   VS:  BP 118/62 (BP Location: Right Arm, Patient Position: Sitting, Cuff Size: Normal)   Pulse 93   Ht 5\' 3"  (1.6 m)   Wt 148 lb (67.1 kg)   BMI 26.22 kg/m    Wt Readings from Last 3 Encounters:  06/21/23 148 lb (67.1 kg)  06/11/23 144 lb 6.4 oz (65.5 kg)  05/06/23 146 lb 6.4 oz (66.4 kg)    GEN: Well nourished, well developed in no acute distress NECK: No JVD; No carotid bruits CARDIAC: RRR, no murmurs, rubs, gallops RESPIRATORY:  Clear to auscultation without rales, wheezing or rhonchi  ABDOMEN: Soft, non-tender, non-distended EXTREMITIES:  No edema; No deformity   ASSESSMENT AND PLAN: .    HTN - BP well controlled. Continue current antihypertensive regimen losartan -HCTZ 100-25 mg daily per PCP.  Would avoid AV nodal blocking agents due to history of bradycardia. HLD -continue rosuvastatin  10 mg daily.  Managed by PCP. Sinus bradycardia - noted by prior EKG. Asymptomatic. Not on AV nodal blocking agent. No indication for further workup at this time. We discussed if she develops palpitations, near syncope, syncope would plan for ZIO monitor.        Dispo: PRN with cardiology  Signed, Diane Curia, NP

## 2023-06-21 NOTE — Progress Notes (Signed)
 Post Operative Evaluation    Procedure/Date of Surgery: Left shoulder reverse shoulder arthroplasty 5/6  Interval History:    Presents today 2 weeks status post the above procedure overall doing well.  She is compliant with sling usage.  She has begun physical therapy   PMH/PSH/Family History/Social History/Meds/Allergies:    Past Medical History:  Diagnosis Date   Allergy     Asthma    Degenerative disc disease, lumbar    GERD (gastroesophageal reflux disease)    Hypertension    Hypothyroidism    Thyroid  disease    hypothyroidism   Ulcer    Past Surgical History:  Procedure Laterality Date   ABDOMINAL HYSTERECTOMY     DILATION AND CURETTAGE OF UTERUS     REVERSE SHOULDER ARTHROPLASTY Left 06/11/2023   Procedure: ARTHROPLASTY, SHOULDER, TOTAL, REVERSE;  Surgeon: Wilhelmenia Harada, MD;  Location: [facility];  Service: Orthopedics;  Laterality: Left;   ROTATOR CUFF REPAIR  11/30/2015   Right shoulder   Social History   Socioeconomic History   Marital status: Married    Spouse name: Royston Cornea   Number of children: 5   Years of education: 12   Highest education level: 12th grade  Occupational History    Employer: retired  Tobacco Use   Smoking status: Never    Passive exposure: Current (husband smokes 50+ years)   Smokeless tobacco: Never  Vaping Use   Vaping status: Never Used  Substance and Sexual Activity   Alcohol use: No   Drug use: No   Sexual activity: Not Currently    Birth control/protection: Surgical    Comment: hyst  Other Topics Concern   Not on file  Social History Narrative   Lives with husband. Has 3 daughters and 2 step-daughters - all live within an hour away   Social Drivers of Health   Financial Resource Strain: Patient Declined (02/03/2023)   Overall Financial Resource Strain (CARDIA)    Difficulty of Paying Living Expenses: Patient declined  Food Insecurity: Low Risk  (06/04/2023)   Received  from [facility]   Hunger Vital Sign    Worried About Running Out of Food in the Last Year: Never true    Ran Out of Food in the Last Year: Never true  Transportation Needs: No Transportation Needs (06/04/2023)   Received from [facility]    In the past 12 months, has lack of reliable transportation kept you from medical appointments, meetings, work or from getting things needed for daily living? : No  Physical Activity: Unknown (02/03/2023)   Exercise Vital Sign    Days of Exercise per Week: Patient declined    Minutes of Exercise per Session: 30 min  Stress: Patient Declined (02/03/2023)   [employer] of Occupational Health - Occupational Stress Questionnaire    Feeling of Stress : Patient declined  Social Connections: Unknown (02/03/2023)   Social Connection and Isolation Panel [NHANES]    Frequency of Communication with Friends and Family: Patient declined    Frequency of Social Gatherings with Friends and Family: Patient declined    Attends Religious Services: Patient declined    Database administrator or Organizations: Patient declined    Attends Engineer, structural: More than 4 times per year    Marital Status: Patient declined   Family  History  Problem Relation Age of Onset   Arthritis Mother    Heart attack Father        66s MI   Heart disease Father    Allergic rhinitis Sister    Asthma Sister    Allergic rhinitis Sister    Allergic rhinitis Brother    Heart disease Brother    Cancer Brother        liver   Asthma Maternal Grandmother    Heart attack Maternal Grandmother    Breast cancer Maternal Grandmother    Healthy Daughter    Healthy Daughter    Healthy Daughter    Healthy Daughter    Healthy Daughter    Food Allergy  Niece    Thyroid  cancer Neg Hx    Colon cancer Neg Hx    Prostate cancer Neg Hx    Ovarian cancer Neg Hx    Angioedema Neg Hx    Eczema Neg Hx    Atopy Neg Hx    Immunodeficiency Neg Hx    Allergies   Allergen Reactions   Anesthesia S-I-40 [Propofol ] Nausea And Vomiting    Per patient need to be careful when giving this   Current Outpatient Medications  Medication Sig Dispense Refill   albuterol  (VENTOLIN  HFA) 108 (90 Base) MCG/ACT inhaler Inhale 2 puffs into the lungs every 4 (four) hours as needed for wheezing or shortness of breath (coughing fits). 18 g 1   Azelastine HCl 137 MCG/SPRAY SOLN Place into the nose.     budesonide -formoterol  (SYMBICORT ) 160-4.5 MCG/ACT inhaler Inhale 2 puffs into the lungs 2 (two) times daily.     celecoxib  (CELEBREX ) 100 MG capsule TAKE 1 CAPSULE BY MOUTH TWICE A DAY 60 capsule 0   Cholecalciferol (VITAMIN D-3 PO) Take by mouth.     dicyclomine (BENTYL) 20 MG tablet Take 20 mg by mouth 3 (three) times daily.     doxepin (SINEQUAN) 10 MG capsule Take 10 mg by mouth at bedtime as needed.     fluticasone  (FLONASE ) 50 MCG/ACT nasal spray Place 2 sprays into both nostrils daily. 16 g 6   hydrOXYzine  (ATARAX ) 10 MG tablet Take 10 mg by mouth 3 (three) times daily as needed for itching.     levothyroxine  (EUTHYROX ) 75 MCG tablet Take 1 tablet (75 mcg total) by mouth daily before breakfast. 100 tablet 3   losartan -hydrochlorothiazide  (HYZAAR) 100-25 MG tablet Take 1 tablet by mouth daily. 90 tablet 3   meclizine  (ANTIVERT ) 25 MG tablet Take 25 mg by mouth 3 (three) times daily as needed for dizziness.     methocarbamol  (ROBAXIN ) 500 MG tablet Take 1 tablet (500 mg total) by mouth 4 (four) times daily. 40 tablet 0   montelukast  (SINGULAIR ) 10 MG tablet Take 1 tablet (10 mg total) by mouth at bedtime. 90 tablet 3   omeprazole  (PRILOSEC) 40 MG capsule Take 1 capsule (40 mg total) by mouth daily. 90 capsule 1   oxyCODONE  (ROXICODONE ) 5 MG immediate release tablet Take 1 tablet (5 mg total) by mouth every 4 (four) hours as needed for severe pain (pain score 7-10) or breakthrough pain. 30 tablet 0   rosuvastatin  (CRESTOR ) 10 MG tablet Take 1 tablet (10 mg total) by mouth  at bedtime. For cholesterol 90 tablet 3   Spacer/Aero-Holding Chambers (AEROCHAMBER PLUS) inhaler Use as instructed 1 each 2   No current facility-administered medications for this visit.   No results found.  Review of Systems:   A ROS was performed including pertinent  positives and negatives as documented in the HPI.   Musculoskeletal Exam:    There were no vitals taken for this visit.  Left shoulder incisions well-appearing with some bruising without erythema or drainage.  In the spine position she can forward elevate to 90 degrees with external rotation at side to 30 degrees.  Distal neurosensory exam is intact with 2+ radial pulse  Imaging:    3 views left shoulder: Status post reverse shoulder arthroplasty without evidence of complication  I personally reviewed and interpreted the radiographs.   Assessment:   73 year old female who is 2 weeks status post left reverse shoulder arthroplasty without evidence of complication.  At this time she will continue active range of motion as tolerated.  She will wear her sling predominantly when out of the house and at night for an additional 2 weeks   Plan :    - Return to clinic 4 weeks for reassessment      I personally saw and evaluated the patient, and participated in the management and treatment plan.  Wilhelmenia Harada, MD Attending Physician, Orthopedic Surgery  This document was dictated using Dragon voice recognition software. A reasonable attempt at proof reading has been made to minimize errors.

## 2023-06-21 NOTE — Patient Instructions (Signed)
 Medication Instructions:  Your physician recommends that you continue on your current medications as directed. Please refer to the Current Medication list given to you today.   Follow-Up: Call us  if you need us !

## 2023-06-25 ENCOUNTER — Ambulatory Visit

## 2023-06-25 DIAGNOSIS — M25512 Pain in left shoulder: Secondary | ICD-10-CM

## 2023-06-25 DIAGNOSIS — M75122 Complete rotator cuff tear or rupture of left shoulder, not specified as traumatic: Secondary | ICD-10-CM | POA: Diagnosis not present

## 2023-06-25 DIAGNOSIS — M25612 Stiffness of left shoulder, not elsewhere classified: Secondary | ICD-10-CM

## 2023-06-25 NOTE — Therapy (Signed)
 OUTPATIENT PHYSICAL THERAPY SHOULDER TREATMENT   Patient Name: Diane Grant MRN: [id number] DOB:01/30/1951, 73 y.o., female Today's Date: 06/25/2023  END OF SESSION:  PT End of Session - 06/25/23 1107     Visit Number 3    Number of Visits 12    Date for PT Re-Evaluation 08/30/23    PT Start Time 1100    PT Stop Time 1142    PT Time Calculation (min) 42 min    Activity Tolerance Patient tolerated treatment well    Behavior During Therapy WFL for tasks assessed/performed               Past Medical History:  Diagnosis Date   Allergy     Asthma    Degenerative disc disease, lumbar    GERD (gastroesophageal reflux disease)    Hypertension    Hypothyroidism    Thyroid  disease    hypothyroidism   Ulcer    Past Surgical History:  Procedure Laterality Date   ABDOMINAL HYSTERECTOMY     DILATION AND CURETTAGE OF UTERUS     REVERSE SHOULDER ARTHROPLASTY Left 06/11/2023   Procedure: ARTHROPLASTY, SHOULDER, TOTAL, REVERSE;  Surgeon: Wilhelmenia Harada, MD;  Location: [facility];  Service: Orthopedics;  Laterality: Left;   ROTATOR CUFF REPAIR  11/30/2015   Right shoulder   Patient Active Problem List   Diagnosis Date Noted   Nontraumatic complete tear of left rotator cuff 06/11/2023   Moderate persistent asthma without complication 04/24/2023   Laryngopharyngeal reflux (LPR) 04/24/2023   Allergy  to alpha-gal 04/24/2023   Second hand tobacco smoke exposure 06/15/2021   Other allergic rhinitis 06/15/2021   Other adverse food reactions, not elsewhere classified, subsequent encounter 06/15/2021   Irritable bowel syndrome with constipation 11/16/2020   Cough 03/29/2020   Acute pharyngitis 03/29/2020   Urticaria 11/27/2019   Palpitations 06/16/2018   Chest pain of uncertain etiology 06/16/2018   Shortness of breath 06/16/2018   Educated about COVID-19 virus infection 06/16/2018   Change in voice 03/08/2017   Degenerative disc disease, lumbar  03/08/2017   Hypothyroidism 06/26/2013   Essential hypertension 06/26/2013    PCP: Manette Section, MD  REFERRING PROVIDER: Wilhelmenia Harada, MD   REFERRING DIAG: Nontraumatic complete tear of left rotator cuff   THERAPY DIAG:  Acute pain of left shoulder  Stiffness of left shoulder, not elsewhere classified  Rationale for Evaluation and Treatment: Rehabilitation  ONSET DATE: 06/11/23  SUBJECTIVE:                                                                                                                                                                                      SUBJECTIVE  STATEMENT: Patient reports that her shoulder is a little tight today.  Hand dominance: Right  PERTINENT HISTORY: Hypertension, asthma, and allergies  PAIN:  Are you having pain? Yes: NPRS scale: Current: 2-3/10 Best: 6/10 Worst: 10/10 Pain location: left shoulder and arm Pain description: constant aching and throbbing Aggravating factors: coughing Relieving factors: ice and medication   PRECAUTIONS: Shoulder  RED FLAGS: None   WEIGHT BEARING RESTRICTIONS: NWB on LUE  FALLS:  Has patient fallen in last 6 months? No  LIVING ENVIRONMENT: Lives with: lives with their family Lives in: House/apartment Has following equipment at home: sling with abduction pillow  OCCUPATION: Retired   PLOF: Independent  PATIENT GOALS:be able garden and working outside, reduced pain, and improved mobility  NEXT MD VISIT: 06/21/23  OBJECTIVE:  Note: Objective measures were completed at Evaluation unless otherwise noted.  DIAGNOSTIC FINDINGS: 06/11/23 left shoulder x-ray IMPRESSION: Baseline radiograph status post left reverse shoulder arthroplasty.  PATIENT SURVEYS:  Quick Dash 95.45  COGNITION: Overall cognitive status: Within functional limits for tasks assessed     SENSATION: Patient reports numbness in her left forearm and hand  UPPER EXTREMITY ROM:   Active ROM Right eval  Left Eval (PROM)  Shoulder flexion 126 60  Shoulder extension    Shoulder abduction 126 55  Shoulder adduction    Shoulder internal rotation    Shoulder external rotation 30 5  Elbow flexion    Elbow extension    Wrist flexion    Wrist extension    Wrist ulnar deviation    Wrist radial deviation    Wrist pronation    Wrist supination    (Blank rows = not tested)  UPPER EXTREMITY MMT: not tested due to surgical condition  SHOULDER SPECIAL TESTS: Not tested due to surgical condition  PALPATION:  TTP: left upper trapezius, supraspinatus, infraspinatus, deltoid, biceps, and triceps                                                                                                                             TREATMENT DATE:                                    06/25/23 EXERCISE LOG  Exercise Repetitions and Resistance Comments  Pulleys  5 minutes   UE ranger (seated) 4.5 minutes  Multidirectional   AA ER with cane  2 minutes Seated   Ball squeeze  2 minutes w/ 3 second hold  For bilateral shoulder IR isometric   Elbow flexion  30 reps    Isometric ball press 2 minutes w/ 3 second hold   Ball roll out  2 minutes  For AA shoulder flexion  Scapular retraction 2.5 minutes   Cane punch out  3 reps    Blank cell = exercise not performed today  Manual Therapy Soft Tissue Mobilization: left biceps, for reduced pain and tone  06/17/23 EXERCISE LOG  Exercise Repetitions and Resistance Comments  Towel squeeze 20 reps  50% max effort  Elbow flexion  20 reps   Wrist flexion/extension 2 minutes   Elbow hang  5 minutes For elbow extension  Manual therapy  See below    Blank cell = exercise not performed today  Manual Therapy Soft Tissue Mobilization: left biceps, triceps, infraspinatus, and deltoid, for reduced pain and tone Passive ROM: left shoulder flexion, abduction, and ER along with left elbow flexion and extension, to tolerance within protocol    PATIENT EDUCATION: Education details: HEP, healing, and expectation for soreness Person educated: Patient Education method: Explanation Education comprehension: verbalized understanding  HOME EXERCISE PROGRAM:   ASSESSMENT:  CLINICAL IMPRESSION: Patient was introduced to multiple new interventions for improved left shoulder mobility. She required minimal cueing with scapular retraction for proper biomechanics to facilitate periscapular engagement. She experienced a mild increase in left biceps pain with cane punch outs. However, this was able to be quickly reduced with soft tissue mobilization to her left biceps. She reported feeling alright upon the conclusion of treatment. She continues to require skilled physical therapy to address her remaining impairments to return to her prior level of function.   OBJECTIVE IMPAIRMENTS: decreased activity tolerance, decreased ROM, decreased strength, hypomobility, impaired sensation, impaired tone, impaired UE functional use, and pain.   ACTIVITY LIMITATIONS: carrying, lifting, sleeping, bathing, dressing, reach over head, and hygiene/grooming  PARTICIPATION LIMITATIONS: meal prep, cleaning, laundry, shopping, community activity, and yard work  PERSONAL FACTORS: Age, Past/current experiences, Time since onset of injury/illness/exacerbation, Transportation, and 3+ comorbidities: Hypertension, asthma, and allergies are also affecting patient's functional outcome.   REHAB POTENTIAL: Good  CLINICAL DECISION MAKING: Evolving/moderate complexity  EVALUATION COMPLEXITY: Moderate   GOALS: Goals reviewed with patient? Yes  SHORT TERM GOALS: Target date: 07/05/23  Patient will be independent with her initial HEP.  Baseline: Goal status: INITIAL  2.  Patient will be able to demonstrate at least 90 degrees of left shoulder flexion for improved function reaching.  Baseline:  Goal status: INITIAL  3.  Patient will be able to demonstrate at least  90 degrees of left shoulder abduction for improved function reaching.  Baseline:  Goal status: INITIAL  4.  Patient will improve her QuickDASH score to 75 or less for improved perceived function with her daily activities.  Baseline:  Goal status: INITIAL  LONG TERM GOALS: Target date: 07/26/23  Patient will be independent with her advanced HEP.  Baseline:  Goal status: INITIAL  2.  Patient will be able to demonstrate at least 120 degrees of active left shoulder flexion for improved function reaching overhead.  Baseline:  Goal status: INITIAL  3.  Patient will be able to demonstrate at least 120 degrees of active left shoulder abduction for improved function reaching overhead.  Baseline:  Goal status: INITIAL  4.  Patient will be able to carry at least 5 pounds in her left hand for improved function gardening.  Baseline:  Goal status: INITIAL  5.  Patient will improve her QuickDASH score to 50 or less for improved perceived function with her daily activities.  Baseline:  Goal status: INITIAL  PLAN:  PT FREQUENCY: 2x/week  PT DURATION: 6 weeks  PLANNED INTERVENTIONS: 97164- PT Re-evaluation, 97750- Physical Performance Testing, 97110-Therapeutic exercises, 97530- Therapeutic activity, [id number]- Neuromuscular re-education, 97535- Self Care, 16109- Manual therapy, [id number]- Electrical stimulation (unattended), 97016- Vasopneumatic device, Patient/Family education, Joint mobilization, Cryotherapy, and Moist heat  PLAN FOR NEXT SESSION: PROM, manual  therapy, pendulums, scapular retraction, and modalities as needed   Lane Pinon, PT 06/25/2023, 12:31 PM

## 2023-06-27 ENCOUNTER — Ambulatory Visit

## 2023-06-27 DIAGNOSIS — M25612 Stiffness of left shoulder, not elsewhere classified: Secondary | ICD-10-CM

## 2023-06-27 DIAGNOSIS — M25512 Pain in left shoulder: Secondary | ICD-10-CM | POA: Diagnosis not present

## 2023-06-27 DIAGNOSIS — M75122 Complete rotator cuff tear or rupture of left shoulder, not specified as traumatic: Secondary | ICD-10-CM | POA: Diagnosis not present

## 2023-06-27 NOTE — Therapy (Signed)
 OUTPATIENT PHYSICAL THERAPY SHOULDER TREATMENT   Patient Name: Diane Grant MRN: [id number] DOB:12-18-1950, 73 y.o., female Today's Date: 06/27/2023  END OF SESSION:  PT End of Session - 06/27/23 1103     Visit Number 4    Number of Visits 12    Date for PT Re-Evaluation 08/30/23    PT Start Time 1100    Activity Tolerance Patient tolerated treatment well    Behavior During Therapy [facility] for tasks assessed/performed               Past Medical History:  Diagnosis Date   Allergy     Asthma    Degenerative disc disease, lumbar    GERD (gastroesophageal reflux disease)    Hypertension    Hypothyroidism    Thyroid  disease    hypothyroidism   Ulcer    Past Surgical History:  Procedure Laterality Date   ABDOMINAL HYSTERECTOMY     DILATION AND CURETTAGE OF UTERUS     REVERSE SHOULDER ARTHROPLASTY Left 06/11/2023   Procedure: ARTHROPLASTY, SHOULDER, TOTAL, REVERSE;  Surgeon: Wilhelmenia Harada, MD;  Location: [facility];  Service: Orthopedics;  Laterality: Left;   ROTATOR CUFF REPAIR  11/30/2015   Right shoulder   Patient Active Problem List   Diagnosis Date Noted   Nontraumatic complete tear of left rotator cuff 06/11/2023   Moderate persistent asthma without complication 04/24/2023   Laryngopharyngeal reflux (LPR) 04/24/2023   Allergy  to alpha-gal 04/24/2023   Second hand tobacco smoke exposure 06/15/2021   Other allergic rhinitis 06/15/2021   Other adverse food reactions, not elsewhere classified, subsequent encounter 06/15/2021   Irritable bowel syndrome with constipation 11/16/2020   Cough 03/29/2020   Acute pharyngitis 03/29/2020   Urticaria 11/27/2019   Palpitations 06/16/2018   Chest pain of uncertain etiology 06/16/2018   Shortness of breath 06/16/2018   Educated about COVID-19 virus infection 06/16/2018   Change in voice 03/08/2017   Degenerative disc disease, lumbar 03/08/2017   Hypothyroidism 06/26/2013   Essential hypertension  06/26/2013    PCP: Manette Section, MD  REFERRING PROVIDER: Wilhelmenia Harada, MD   REFERRING DIAG: Nontraumatic complete tear of left rotator cuff   THERAPY DIAG:  Acute pain of left shoulder  Stiffness of left shoulder, not elsewhere classified  Rationale for Evaluation and Treatment: Rehabilitation  ONSET DATE: 06/11/23  SUBJECTIVE:                                                                                                                                                                                      SUBJECTIVE STATEMENT: Patient reports 4/10  left shoulder pain today.  Pt reports having issues sleeping.  Hand dominance: Right  PERTINENT HISTORY: Hypertension, asthma, and allergies  PAIN:  Are you having pain? Yes: NPRS scale: 4/10 Pain location: left shoulder and arm Pain description: constant aching and throbbing Aggravating factors: coughing Relieving factors: ice and medication   PRECAUTIONS: Shoulder  RED FLAGS: None   WEIGHT BEARING RESTRICTIONS: NWB on LUE  FALLS:  Has patient fallen in last 6 months? No  LIVING ENVIRONMENT: Lives with: lives with their family Lives in: House/apartment Has following equipment at home: sling with abduction pillow  OCCUPATION: Retired   PLOF: Independent  PATIENT GOALS:be able garden and working outside, reduced pain, and improved mobility  NEXT MD VISIT: 06/21/23  OBJECTIVE:  Note: Objective measures were completed at Evaluation unless otherwise noted.  DIAGNOSTIC FINDINGS: 06/11/23 left shoulder x-ray IMPRESSION: Baseline radiograph status post left reverse shoulder arthroplasty.  PATIENT SURVEYS:  Quick Dash 95.45  COGNITION: Overall cognitive status: Within functional limits for tasks assessed     SENSATION: Patient reports numbness in her left forearm and hand  UPPER EXTREMITY ROM:   Active ROM Right eval Left Eval (PROM)  Shoulder flexion 126 60  Shoulder extension    Shoulder  abduction 126 55  Shoulder adduction    Shoulder internal rotation    Shoulder external rotation 30 5  Elbow flexion    Elbow extension    Wrist flexion    Wrist extension    Wrist ulnar deviation    Wrist radial deviation    Wrist pronation    Wrist supination    (Blank rows = not tested)  UPPER EXTREMITY MMT: not tested due to surgical condition  SHOULDER SPECIAL TESTS: Not tested due to surgical condition  PALPATION:  TTP: left upper trapezius, supraspinatus, infraspinatus, deltoid, biceps, and triceps                                                                                                                             TREATMENT DATE:                                    06/27/23 EXERCISE LOG  Exercise Repetitions and Resistance Comments  Pulleys  5 minutes   UE ranger (seated) Flex/ext; CW and CCW circles x 2 mins each   AA Flexion Cane 2 sets of 10 reps   AA Chest Press Cane 2 sets of 10 reps    AA ER with cane  2.5 minutes   Ball squeeze  2.5 minutes w/ 3 second hold    Bicep Curls 3-way    Isometric ball press 2 minutes w/ 3 second hold   Ball roll out  2 minutes    Scapular retraction 2.5 minutes   Cane punch out      Blank cell = exercise not performed today  Manual Therapy Soft Tissue Mobilization: left biceps, for reduced pain and tone  Modalities  Date:  Vaso: Shoulder, 34 degrees; low pressure, 10 mins, Pain and Edema                                    06/17/23 EXERCISE LOG  Exercise Repetitions and Resistance Comments  Towel squeeze 20 reps  50% max effort  Elbow flexion  20 reps   Wrist flexion/extension 2 minutes   Elbow hang  5 minutes For elbow extension  Manual therapy  See below    Blank cell = exercise not performed today  Manual Therapy Soft Tissue Mobilization: left biceps, triceps, infraspinatus, and deltoid, for reduced pain and tone Passive ROM: left shoulder flexion, abduction, and ER along with left elbow flexion and extension,  to tolerance within protocol   PATIENT EDUCATION: Education details: HEP, healing, and expectation for soreness Person educated: Patient Education method: Explanation Education comprehension: verbalized understanding  HOME EXERCISE PROGRAM:   ASSESSMENT:  CLINICAL IMPRESSION: Pt arrives for today's treatment session reporting 4/10 left shoulder pain.  Pt reports increased issue with sleeping at night. Pt able to tolerate increased time or reps with AA exercises causing increased discomfort in left upper extremity.  STW/M performed to left shoulder musculature to decrease pain and tone.  Normal responses to vaso noted upon removal.  Pt denied any change in pain at completion of today's treatment session.   OBJECTIVE IMPAIRMENTS: decreased activity tolerance, decreased ROM, decreased strength, hypomobility, impaired sensation, impaired tone, impaired UE functional use, and pain.   ACTIVITY LIMITATIONS: carrying, lifting, sleeping, bathing, dressing, reach over head, and hygiene/grooming  PARTICIPATION LIMITATIONS: meal prep, cleaning, laundry, shopping, community activity, and yard work  PERSONAL FACTORS: Age, Past/current experiences, Time since onset of injury/illness/exacerbation, Transportation, and 3+ comorbidities: Hypertension, asthma, and allergies are also affecting patient's functional outcome.   REHAB POTENTIAL: Good  CLINICAL DECISION MAKING: Evolving/moderate complexity  EVALUATION COMPLEXITY: Moderate   GOALS: Goals reviewed with patient? Yes  SHORT TERM GOALS: Target date: 07/05/23  Patient will be independent with her initial HEP.  Baseline: Goal status: INITIAL  2.  Patient will be able to demonstrate at least 90 degrees of left shoulder flexion for improved function reaching.  Baseline:  Goal status: INITIAL  3.  Patient will be able to demonstrate at least 90 degrees of left shoulder abduction for improved function reaching.  Baseline:  Goal status:  INITIAL  4.  Patient will improve her QuickDASH score to 75 or less for improved perceived function with her daily activities.  Baseline:  Goal status: INITIAL  LONG TERM GOALS: Target date: 07/26/23  Patient will be independent with her advanced HEP.  Baseline:  Goal status: INITIAL  2.  Patient will be able to demonstrate at least 120 degrees of active left shoulder flexion for improved function reaching overhead.  Baseline:  Goal status: INITIAL  3.  Patient will be able to demonstrate at least 120 degrees of active left shoulder abduction for improved function reaching overhead.  Baseline:  Goal status: INITIAL  4.  Patient will be able to carry at least 5 pounds in her left hand for improved function gardening.  Baseline:  Goal status: INITIAL  5.  Patient will improve her QuickDASH score to 50 or less for improved perceived function with her daily activities.  Baseline:  Goal status: INITIAL  PLAN:  PT FREQUENCY: 2x/week  PT DURATION: 6 weeks  PLANNED INTERVENTIONS: 97164- PT Re-evaluation, 97750- Physical Performance Testing, 97110-Therapeutic exercises, 97530-  Therapeutic activity, [id number]- Neuromuscular re-education, [phone]- Self Care, 11914- Manual therapy, G0283- Electrical stimulation (unattended), 97016- Vasopneumatic device, Patient/Family education, Joint mobilization, Cryotherapy, and Moist heat  PLAN FOR NEXT SESSION: PROM, manual therapy, pendulums, scapular retraction, and modalities as needed   Deryl Flora, PTA 06/27/2023, 11:41 AM

## 2023-07-02 ENCOUNTER — Ambulatory Visit: Admitting: *Deleted

## 2023-07-02 ENCOUNTER — Encounter: Payer: Self-pay | Admitting: *Deleted

## 2023-07-02 DIAGNOSIS — M75122 Complete rotator cuff tear or rupture of left shoulder, not specified as traumatic: Secondary | ICD-10-CM | POA: Diagnosis not present

## 2023-07-02 DIAGNOSIS — M25512 Pain in left shoulder: Secondary | ICD-10-CM | POA: Diagnosis not present

## 2023-07-02 DIAGNOSIS — M25612 Stiffness of left shoulder, not elsewhere classified: Secondary | ICD-10-CM

## 2023-07-02 NOTE — Therapy (Signed)
 OUTPATIENT PHYSICAL THERAPY SHOULDER TREATMENT   Patient Name: Diane Grant MRN: [id number] DOB:09-Aug-1950, 73 y.o., female Today's Date: 07/02/2023  END OF SESSION:  PT End of Session - 07/02/23 1520     Visit Number 5    Number of Visits 12    Date for PT Re-Evaluation 08/30/23    PT Start Time 1515    PT Stop Time 1600    PT Time Calculation (min) 45 min               Past Medical History:  Diagnosis Date   Allergy     Asthma    Degenerative disc disease, lumbar    GERD (gastroesophageal reflux disease)    Hypertension    Hypothyroidism    Thyroid  disease    hypothyroidism   Ulcer    Past Surgical History:  Procedure Laterality Date   ABDOMINAL HYSTERECTOMY     DILATION AND CURETTAGE OF UTERUS     REVERSE SHOULDER ARTHROPLASTY Left 06/11/2023   Procedure: ARTHROPLASTY, SHOULDER, TOTAL, REVERSE;  Surgeon: Wilhelmenia Harada, MD;  Location: [facility];  Service: Orthopedics;  Laterality: Left;   ROTATOR CUFF REPAIR  11/30/2015   Right shoulder   Patient Active Problem List   Diagnosis Date Noted   Nontraumatic complete tear of left rotator cuff 06/11/2023   Moderate persistent asthma without complication 04/24/2023   Laryngopharyngeal reflux (LPR) 04/24/2023   Allergy  to alpha-gal 04/24/2023   Second hand tobacco smoke exposure 06/15/2021   Other allergic rhinitis 06/15/2021   Other adverse food reactions, not elsewhere classified, subsequent encounter 06/15/2021   Irritable bowel syndrome with constipation 11/16/2020   Cough 03/29/2020   Acute pharyngitis 03/29/2020   Urticaria 11/27/2019   Palpitations 06/16/2018   Chest pain of uncertain etiology 06/16/2018   Shortness of breath 06/16/2018   Educated about COVID-19 virus infection 06/16/2018   Change in voice 03/08/2017   Degenerative disc disease, lumbar 03/08/2017   Hypothyroidism 06/26/2013   Essential hypertension 06/26/2013    PCP: Manette Section, MD  REFERRING  PROVIDER: Wilhelmenia Harada, MD   REFERRING DIAG: Nontraumatic complete tear of left rotator cuff   THERAPY DIAG:  Acute pain of left shoulder  Stiffness of left shoulder, not elsewhere classified  Rationale for Evaluation and Treatment: Rehabilitation  ONSET DATE: 06/11/23  SUBJECTIVE:                                                                                                                                                                                      SUBJECTIVE STATEMENT: Patient reports 3/10  left shoulder pain today.  Pt reports having issues sleeping still  Hand dominance:  Right  PERTINENT HISTORY: Hypertension, asthma, and allergies  PAIN:  Are you having pain? Yes: NPRS scale: 3/10 Pain location: left shoulder and arm Pain description: constant aching and throbbing Aggravating factors: coughing Relieving factors: ice and medication   PRECAUTIONS: Shoulder  RED FLAGS: None   WEIGHT BEARING RESTRICTIONS: NWB on LUE  FALLS:  Has patient fallen in last 6 months? No  LIVING ENVIRONMENT: Lives with: lives with their family Lives in: House/apartment Has following equipment at home: sling with abduction pillow  OCCUPATION: Retired   PLOF: Independent  PATIENT GOALS:be able garden and working outside, reduced pain, and improved mobility  NEXT MD VISIT: 06/21/23  OBJECTIVE:  Note: Objective measures were completed at Evaluation unless otherwise noted.  DIAGNOSTIC FINDINGS: 06/11/23 left shoulder x-ray IMPRESSION: Baseline radiograph status post left reverse shoulder arthroplasty.  PATIENT SURVEYS:  Quick Dash 95.45  COGNITION: Overall cognitive status: Within functional limits for tasks assessed     SENSATION: Patient reports numbness in her left forearm and hand  UPPER EXTREMITY ROM:   Active ROM Right eval Left Eval (PROM)  Shoulder flexion 126 60  Shoulder extension    Shoulder abduction 126 55  Shoulder adduction    Shoulder  internal rotation    Shoulder external rotation 30 5  Elbow flexion    Elbow extension    Wrist flexion    Wrist extension    Wrist ulnar deviation    Wrist radial deviation    Wrist pronation    Wrist supination    (Blank rows = not tested)  UPPER EXTREMITY MMT: not tested due to surgical condition  SHOULDER SPECIAL TESTS: Not tested due to surgical condition  PALPATION:  TTP: left upper trapezius, supraspinatus, infraspinatus, deltoid, biceps, and triceps                                                                                                                             TREATMENT DATE:                                    07/02/23 EXERCISE LOG   LT shldr   RTS  Exercise Repetitions and Resistance Comments  Pulleys  6 minutes   UE ranger (seated) Flex/ext; CW and CCW circles x 2 mins each   AA Flexion  supine Cane 1sets of 10 reps   AA Chest Press supine Cane 3 sets of 10 reps    AA ER with cane  2.5 minutes   Ball squeeze     Bicep Curls 3-way    Isometric ball press    Ball roll out on mat with wedge 4 minutes    Scapular retraction    Cane punch out      Blank cell = exercise not performed today  Manual Therapy Soft Tissue Mobilization:  ,   Manual  PROM and AAROM supine  flexion and  ER  Reviewed HEP and added supine cane press Modalities  Date:  Vaso: Shoulder,  ,   mins, Pain and Edema                                    06/17/23 EXERCISE LOG  Exercise Repetitions and Resistance Comments  Towel squeeze 20 reps  50% max effort  Elbow flexion  20 reps   Wrist flexion/extension 2 minutes   Elbow hang  5 minutes For elbow extension  Manual therapy  See below    Blank cell = exercise not performed today  Manual Therapy Soft Tissue Mobilization: left biceps, triceps, infraspinatus, and deltoid, for reduced pain and tone Passive ROM: left shoulder flexion, abduction, and ER along with left elbow flexion and extension, to tolerance within protocol   PATIENT  EDUCATION: Education details: HEP, healing, and expectation for soreness Person educated: Patient Education method: Explanation Education comprehension: verbalized understanding  HOME EXERCISE PROGRAM:   ASSESSMENT:  CLINICAL IMPRESSION: Pt arrives for today's treatment session reporting 3/10 left shoulder pain. She was able to continue with AAROM exs in sitting as well as in standing and did well. Manual AAROM was performed in supine for elevation and ER with good end-range feel. Reviewed HEP and added supine cane press.  OBJECTIVE IMPAIRMENTS: decreased activity tolerance, decreased ROM, decreased strength, hypomobility, impaired sensation, impaired tone, impaired UE functional use, and pain.   ACTIVITY LIMITATIONS: carrying, lifting, sleeping, bathing, dressing, reach over head, and hygiene/grooming  PARTICIPATION LIMITATIONS: meal prep, cleaning, laundry, shopping, community activity, and yard work  PERSONAL FACTORS: Age, Past/current experiences, Time since onset of injury/illness/exacerbation, Transportation, and 3+ comorbidities: Hypertension, asthma, and allergies are also affecting patient's functional outcome.   REHAB POTENTIAL: Good  CLINICAL DECISION MAKING: Evolving/moderate complexity  EVALUATION COMPLEXITY: Moderate   GOALS: Goals reviewed with patient? Yes  SHORT TERM GOALS: Target date: 07/05/23  Patient will be independent with her initial HEP.  Baseline: Goal status: Partially-met  2.  Patient will be able to demonstrate at least 90 degrees of left shoulder flexion for improved function reaching.  Baseline:  Goal status: On going PROM to 90 degrees  3.  Patient will be able to demonstrate at least 90 degrees of left shoulder abduction for improved function reaching.  Baseline:  Goal status: On going  4.  Patient will improve her QuickDASH score to 75 or less for improved perceived function with her daily activities.  Baseline:  Goal status: On  going  LONG TERM GOALS: Target date: 07/26/23  Patient will be independent with her advanced HEP.  Baseline:  Goal status: INITIAL  2.  Patient will be able to demonstrate at least 120 degrees of active left shoulder flexion for improved function reaching overhead.  Baseline:  Goal status: INITIAL  3.  Patient will be able to demonstrate at least 120 degrees of active left shoulder abduction for improved function reaching overhead.  Baseline:  Goal status: INITIAL  4.  Patient will be able to carry at least 5 pounds in her left hand for improved function gardening.  Baseline:  Goal status: INITIAL  5.  Patient will improve her QuickDASH score to 50 or less for improved perceived function with her daily activities.  Baseline:  Goal status: INITIAL  PLAN:  PT FREQUENCY: 2x/week  PT DURATION: 6 weeks  PLANNED INTERVENTIONS: 97164- PT Re-evaluation, 97750- Physical Performance Testing, 97110-Therapeutic exercises, 97530- Therapeutic activity, [id number]  Neuromuscular re-education, [phone]- Self Care, 60454- Manual therapy, G0283- Electrical stimulation (unattended), 97016- Vasopneumatic device, Patient/Family education, Joint mobilization, Cryotherapy, and Moist heat  PLAN FOR NEXT SESSION: PROM, manual therapy, pendulums, scapular retraction, and modalities as needed   Leilyn Frayre,CHRIS, PTA 07/02/2023, 4:15 PM

## 2023-07-04 ENCOUNTER — Ambulatory Visit

## 2023-07-04 DIAGNOSIS — M25612 Stiffness of left shoulder, not elsewhere classified: Secondary | ICD-10-CM

## 2023-07-04 DIAGNOSIS — M25512 Pain in left shoulder: Secondary | ICD-10-CM | POA: Diagnosis not present

## 2023-07-04 DIAGNOSIS — M75122 Complete rotator cuff tear or rupture of left shoulder, not specified as traumatic: Secondary | ICD-10-CM | POA: Diagnosis not present

## 2023-07-04 NOTE — Therapy (Signed)
 OUTPATIENT PHYSICAL THERAPY SHOULDER TREATMENT   Patient Name: Diane Grant MRN: [id number] DOB:[birth year], 74 y.o., female Today's Date: 07/04/2023  END OF SESSION:  PT End of Session - 07/04/23 1102     Visit Number 6    Number of Visits 12    Date for PT Re-Evaluation 08/30/23    PT Start Time 1100    PT Stop Time 1144    PT Time Calculation (min) 44 min               Past Medical History:  Diagnosis Date   Allergy     Asthma    Degenerative disc disease, lumbar    GERD (gastroesophageal reflux disease)    Hypertension    Hypothyroidism    Thyroid  disease    hypothyroidism   Ulcer    Past Surgical History:  Procedure Laterality Date   ABDOMINAL HYSTERECTOMY     DILATION AND CURETTAGE OF UTERUS     REVERSE SHOULDER ARTHROPLASTY Left 06/11/2023   Procedure: ARTHROPLASTY, SHOULDER, TOTAL, REVERSE;  Surgeon: Wilhelmenia Harada, MD;  Location: [facility];  Service: Orthopedics;  Laterality: Left;   ROTATOR CUFF REPAIR  11/30/2015   Right shoulder   Patient Active Problem List   Diagnosis Date Noted   Nontraumatic complete tear of left rotator cuff 06/11/2023   Moderate persistent asthma without complication 04/24/2023   Laryngopharyngeal reflux (LPR) 04/24/2023   Allergy  to alpha-gal 04/24/2023   Second hand tobacco smoke exposure 06/15/2021   Other allergic rhinitis 06/15/2021   Other adverse food reactions, not elsewhere classified, subsequent encounter 06/15/2021   Irritable bowel syndrome with constipation 11/16/2020   Cough 03/29/2020   Acute pharyngitis 03/29/2020   Urticaria 11/27/2019   Palpitations 06/16/2018   Chest pain of uncertain etiology 06/16/2018   Shortness of breath 06/16/2018   Educated about COVID-19 virus infection 06/16/2018   Change in voice 03/08/2017   Degenerative disc disease, lumbar 03/08/2017   Hypothyroidism 06/26/2013   Essential hypertension 06/26/2013    PCP: Manette Section, MD  REFERRING  PROVIDER: Wilhelmenia Harada, MD   REFERRING DIAG: Nontraumatic complete tear of left rotator cuff   THERAPY DIAG:  Acute pain of left shoulder  Stiffness of left shoulder, not elsewhere classified  Rationale for Evaluation and Treatment: Rehabilitation  ONSET DATE: 06/11/23  SUBJECTIVE:                                                                                                                                                                                      SUBJECTIVE STATEMENT: Patient reports 2/10 left shoulder pain today.   Hand dominance: Right  PERTINENT HISTORY: Hypertension, asthma, and  allergies  PAIN:  Are you having pain? Yes: NPRS scale: 2/10 Pain location: left shoulder and arm Pain description: constant aching and throbbing Aggravating factors: coughing Relieving factors: ice and medication   PRECAUTIONS: Shoulder  RED FLAGS: None   WEIGHT BEARING RESTRICTIONS: NWB on LUE  FALLS:  Has patient fallen in last 6 months? No  LIVING ENVIRONMENT: Lives with: lives with their family Lives in: House/apartment Has following equipment at home: sling with abduction pillow  OCCUPATION: Retired   PLOF: Independent  PATIENT GOALS:be able garden and working outside, reduced pain, and improved mobility  NEXT MD VISIT: 06/21/23  OBJECTIVE:  Note: Objective measures were completed at Evaluation unless otherwise noted.  DIAGNOSTIC FINDINGS: 06/11/23 left shoulder x-ray IMPRESSION: Baseline radiograph status post left reverse shoulder arthroplasty.  PATIENT SURVEYS:  Quick Dash 95.45  COGNITION: Overall cognitive status: Within functional limits for tasks assessed     SENSATION: Patient reports numbness in her left forearm and hand  UPPER EXTREMITY ROM:   Active ROM Right eval Left Eval (PROM)  Shoulder flexion 126 60  Shoulder extension    Shoulder abduction 126 55  Shoulder adduction    Shoulder internal rotation    Shoulder external rotation  30 5  Elbow flexion    Elbow extension    Wrist flexion    Wrist extension    Wrist ulnar deviation    Wrist radial deviation    Wrist pronation    Wrist supination    (Blank rows = not tested)  UPPER EXTREMITY MMT: not tested due to surgical condition  SHOULDER SPECIAL TESTS: Not tested due to surgical condition  PALPATION:  TTP: left upper trapezius, supraspinatus, infraspinatus, deltoid, biceps, and triceps                                                                                                                             TREATMENT DATE:   07/04/23    EXERCISE LOG   LT shldr   RTS  Exercise Repetitions and Resistance Comments  Pulleys  6 minutes   UE ranger (seated) Flex/ext; CW and CCW circles x 2.5 mins each   AA Flexion  seated  Cane 2 sets of 10 reps   AA Chest Press seated Cane 2 sets of 15 reps    AA ER with cane  3 minutes   AA Abduction with cane 2.5 mins   Ball squeeze  3.5 mins   Bicep Curls 3-way 2# x 20 reps each way   Therabard Red x 15 reps each way   Ball roll out on mat with wedge 4 minutes    Scapular retraction    Cane punch out      Blank cell = exercise not performed today                                     06/17/23  EXERCISE LOG  Exercise Repetitions and Resistance Comments  Towel squeeze 20 reps  50% max effort  Elbow flexion  20 reps   Wrist flexion/extension 2 minutes   Elbow hang  5 minutes For elbow extension  Manual therapy  See below    Blank cell = exercise not performed today  Manual Therapy Soft Tissue Mobilization: left biceps, triceps, infraspinatus, and deltoid, for reduced pain and tone Passive ROM: left shoulder flexion, abduction, and ER along with left elbow flexion and extension, to tolerance within protocol   PATIENT EDUCATION: Education details: HEP, healing, and expectation for soreness Person educated: Patient Education method: Explanation Education comprehension: verbalized understanding  HOME EXERCISE  PROGRAM:   ASSESSMENT:  CLINICAL IMPRESSION: Pt arrives for today's treatment session reporting 2/10 left shoulder pain.  Pt able to tolerate increased reps and time with various exercises today without discomfort.  Pt able to demonstrate 96 degrees of AA flexion today.  Pt introduced to AA shoulder abduction with cane with min cues required for proper technique.  Pt declined vaso today, stating it's "already too cold."  Pt reported "my shoulder feels good" at completion of today's treatment session.  OBJECTIVE IMPAIRMENTS: decreased activity tolerance, decreased ROM, decreased strength, hypomobility, impaired sensation, impaired tone, impaired UE functional use, and pain.   ACTIVITY LIMITATIONS: carrying, lifting, sleeping, bathing, dressing, reach over head, and hygiene/grooming  PARTICIPATION LIMITATIONS: meal prep, cleaning, laundry, shopping, community activity, and yard work  PERSONAL FACTORS: Age, Past/current experiences, Time since onset of injury/illness/exacerbation, Transportation, and 3+ comorbidities: Hypertension, asthma, and allergies are also affecting patient's functional outcome.   REHAB POTENTIAL: Good  CLINICAL DECISION MAKING: Evolving/moderate complexity  EVALUATION COMPLEXITY: Moderate   GOALS: Goals reviewed with patient? Yes  SHORT TERM GOALS: Target date: 07/05/23  Patient will be independent with her initial HEP.  Baseline: Goal status: MET  2.  Patient will be able to demonstrate at least 90 degrees of left shoulder flexion for improved function reaching.  Baseline: 5/29: AA 98 degrees Goal status: PARTIALLY MET  3.  Patient will be able to demonstrate at least 90 degrees of left shoulder abduction for improved function reaching.  Baseline:  Goal status: On going  4.  Patient will improve her QuickDASH score to 75 or less for improved perceived function with her daily activities.  Baseline:  Goal status: On going  LONG TERM GOALS: Target date:  07/26/23  Patient will be independent with her advanced HEP.  Baseline:  Goal status: INITIAL  2.  Patient will be able to demonstrate at least 120 degrees of active left shoulder flexion for improved function reaching overhead.  Baseline:  Goal status: INITIAL  3.  Patient will be able to demonstrate at least 120 degrees of active left shoulder abduction for improved function reaching overhead.  Baseline:  Goal status: INITIAL  4.  Patient will be able to carry at least 5 pounds in her left hand for improved function gardening.  Baseline:  Goal status: INITIAL  5.  Patient will improve her QuickDASH score to 50 or less for improved perceived function with her daily activities.  Baseline:  Goal status: INITIAL  PLAN:  PT FREQUENCY: 2x/week  PT DURATION: 6 weeks  PLANNED INTERVENTIONS: 97164- PT Re-evaluation, 97750- Physical Performance Testing, 97110-Therapeutic exercises, 97530- Therapeutic activity, [id number]- Neuromuscular re-education, 97535- Self Care, 09811- Manual therapy, G0283- Electrical stimulation (unattended), 97016- Vasopneumatic device, Patient/Family education, Joint mobilization, Cryotherapy, and Moist heat  PLAN FOR NEXT SESSION: PROM, manual therapy, pendulums, scapular retraction,  and modalities as needed   Deryl Flora, PTA 07/04/2023, 11:49 AM

## 2023-07-08 ENCOUNTER — Encounter: Payer: Self-pay | Admitting: Family Medicine

## 2023-07-08 ENCOUNTER — Ambulatory Visit (INDEPENDENT_AMBULATORY_CARE_PROVIDER_SITE_OTHER): Admitting: Family Medicine

## 2023-07-08 VITALS — BP 131/80 | Ht 63.0 in | Wt 148.0 lb

## 2023-07-08 DIAGNOSIS — Z0000 Encounter for general adult medical examination without abnormal findings: Secondary | ICD-10-CM

## 2023-07-08 NOTE — Progress Notes (Signed)
 Subjective:   Diane Grant is a 73 y.o. female who presents for Medicare Annual (Subsequent) preventive examination.  Visit Complete: Virtual I connected with  Diane Grant on 07/08/23 by a audio enabled telemedicine application and verified that I am speaking with the correct person using two identifiers.  Patient Location: Home  Provider Location: Office/Clinic  I discussed the limitations of evaluation and management by telemedicine. The patient expressed understanding and agreed to proceed.  Vital Signs: Because this visit was a virtual/telehealth visit, some criteria may be missing or patient reported. Any vitals not documented were not able to be obtained and vitals that have been documented are patient reported.  Patient Medicare AWV questionnaire was completed by the patient on 07/08/2023; I have confirmed that all information answered by patient is correct and no changes since this date.  Cardiac Risk Factors include: advanced age (>66men, >55 women);hypertension     Objective:    Today's Vitals   07/08/23 1329 07/08/23 1330  BP:  131/80  Weight:  148 lb (67.1 kg)  Height:  5\' 3"  (1.6 m)  PainSc: 2     Body mass index is 26.22 kg/m.     06/14/2023   11:34 AM 06/11/2023    7:29 AM 06/04/2023    3:55 PM 11/06/2022    2:24 PM 07/04/2022    9:11 AM 06/28/2021    8:23 AM 06/03/2020   10:02 AM  Advanced Directives  Does Patient Have a Medical Advance Directive? No No No No No No No  Would patient like information on creating a medical advance directive?  No - Patient declined   No - Patient declined No - Patient declined Yes (MAU/Ambulatory/Procedural Areas - Information given)    Current Medications (verified) Outpatient Encounter Medications as of 07/08/2023  Medication Sig   albuterol  (VENTOLIN  HFA) 108 (90 Base) MCG/ACT inhaler Inhale 2 puffs into the lungs every 4 (four) hours as needed for wheezing or shortness of breath (coughing fits).    Azelastine HCl 137 MCG/SPRAY SOLN Place into the nose.   budesonide -formoterol  (SYMBICORT ) 160-4.5 MCG/ACT inhaler Inhale 2 puffs into the lungs 2 (two) times daily.   celecoxib  (CELEBREX ) 100 MG capsule TAKE 1 CAPSULE BY MOUTH TWICE A DAY   Cholecalciferol (VITAMIN D-3 PO) Take by mouth.   dicyclomine (BENTYL) 20 MG tablet Take 20 mg by mouth 3 (three) times daily.   doxepin (SINEQUAN) 10 MG capsule Take 10 mg by mouth at bedtime as needed.   fluticasone  (FLONASE ) 50 MCG/ACT nasal spray Place 2 sprays into both nostrils daily.   hydrOXYzine  (ATARAX ) 10 MG tablet Take 10 mg by mouth 3 (three) times daily as needed for itching.   levothyroxine  (EUTHYROX ) 75 MCG tablet Take 1 tablet (75 mcg total) by mouth daily before breakfast.   losartan -hydrochlorothiazide  (HYZAAR) 100-25 MG tablet Take 1 tablet by mouth daily.   meclizine  (ANTIVERT ) 25 MG tablet Take 25 mg by mouth 3 (three) times daily as needed for dizziness.   methocarbamol  (ROBAXIN ) 500 MG tablet Take 1 tablet (500 mg total) by mouth 4 (four) times daily.   montelukast  (SINGULAIR ) 10 MG tablet Take 1 tablet (10 mg total) by mouth at bedtime.   omeprazole  (PRILOSEC) 40 MG capsule Take 1 capsule (40 mg total) by mouth daily.   oxyCODONE  (ROXICODONE ) 5 MG immediate release tablet Take 1 tablet (5 mg total) by mouth every 4 (four) hours as needed for severe pain (pain score 7-10) or breakthrough pain.   rosuvastatin  (CRESTOR )  10 MG tablet Take 1 tablet (10 mg total) by mouth at bedtime. For cholesterol   Spacer/Aero-Holding Chambers (AEROCHAMBER PLUS) inhaler Use as instructed   No facility-administered encounter medications on file as of 07/08/2023.    Allergies (verified) Anesthesia s-i-40 [propofol ]   History: Past Medical History:  Diagnosis Date   Allergy     Asthma    Degenerative disc disease, lumbar    GERD (gastroesophageal reflux disease)    Hypertension    Hypothyroidism    Thyroid  disease    hypothyroidism   Ulcer     Past Surgical History:  Procedure Laterality Date   ABDOMINAL HYSTERECTOMY     DILATION AND CURETTAGE OF UTERUS     REVERSE SHOULDER ARTHROPLASTY Left 06/11/2023   Procedure: ARTHROPLASTY, SHOULDER, TOTAL, REVERSE;  Surgeon: Wilhelmenia Harada, MD;  Location: [facility];  Service: Orthopedics;  Laterality: Left;   ROTATOR CUFF REPAIR  11/30/2015   Right shoulder   Family History  Problem Relation Age of Onset   Arthritis Mother    Heart attack Father        20s MI   Heart disease Father    Allergic rhinitis Sister    Asthma Sister    Allergic rhinitis Sister    Allergic rhinitis Brother    Heart disease Brother    Cancer Brother        liver   Asthma Maternal Grandmother    Heart attack Maternal Grandmother    Breast cancer Maternal Grandmother    Healthy Daughter    Healthy Daughter    Healthy Daughter    Healthy Daughter    Healthy Daughter    Food Allergy  Niece    Thyroid  cancer Neg Hx    Colon cancer Neg Hx    Prostate cancer Neg Hx    Ovarian cancer Neg Hx    Angioedema Neg Hx    Eczema Neg Hx    Atopy Neg Hx    Immunodeficiency Neg Hx    Social History   Socioeconomic History   Marital status: Married    Spouse name: Royston Cornea   Number of children: 5   Years of education: 12   Highest education level: 12th grade  Occupational History    Employer: retired  Tobacco Use   Smoking status: Never    Passive exposure: Current (husband smokes 50+ years)   Smokeless tobacco: Never  Vaping Use   Vaping status: Never Used  Substance and Sexual Activity   Alcohol use: No   Drug use: No   Sexual activity: Not Currently    Birth control/protection: Surgical    Comment: hyst  Other Topics Concern   Not on file  Social History Narrative   Lives with husband. Has 3 daughters and 2 step-daughters - all live within an hour away   Social Drivers of Health   Financial Resource Strain: Low Risk  (07/08/2023)   Overall Financial Resource Strain (CARDIA)     Difficulty of Paying Living Expenses: Not hard at all  Food Insecurity: Low Risk  (06/04/2023)   Received from [facility]   Hunger Vital Sign    Worried About Running Out of Food in the Last Year: Never true    Ran Out of Food in the Last Year: Never true  Transportation Needs: No Transportation Needs (06/04/2023)   Received from [facility]    In the past 12 months, has lack of reliable transportation kept you from medical appointments, meetings, work or  from getting things needed for daily living? : No  Physical Activity: Insufficiently Active (07/08/2023)   Exercise Vital Sign    Days of Exercise per Week: 7 days    Minutes of Exercise per Session: 20 min  Stress: No Stress Concern Present (07/08/2023)   [employer] of Occupational Health - Occupational Stress Questionnaire    Feeling of Stress : Not at all  Social Connections: Unknown (02/03/2023)   Social Connection and Isolation Panel [NHANES]    Frequency of Communication with Friends and Family: Patient declined    Frequency of Social Gatherings with Friends and Family: Patient declined    Attends Religious Services: Patient declined    Database administrator or Organizations: Patient declined    Attends Engineer, structural: More than 4 times per year    Marital Status: Patient declined    Tobacco Counseling Counseling given: Not Answered   Clinical Intake:  Pre-visit preparation completed: No  Pain : 0-10 Pain Score: 2  Pain Type: Chronic pain Pain Location: Neck Pain Orientation: Left Pain Radiating Towards: recent surgery a month ago Dr Hermina Loosen Pain Descriptors / Indicators: Aching Pain Onset: More than a month ago Pain Frequency: Occasional Pain Relieving Factors: relieved by Tylenol  Xtra strength  Pain Relieving Factors: relieved by Tylenol  Xtra strength  BMI - recorded: 26 Nutritional Status: BMI 25 -29 Overweight Nutritional Risks: None Diabetes: No  How often do you  need to have someone help you when you read instructions, pamphlets, or other written materials from your doctor or pharmacy?: 1 - Never What is the last grade level you completed in school?: 12th  Interpreter Needed?: No      Activities of Daily Living    07/08/2023    1:32 PM 06/11/2023    7:34 AM  In your present state of health, do you have any difficulty performing the following activities:  Hearing? 0 0  Vision? 0 0  Difficulty concentrating or making decisions? 0 0  Walking or climbing stairs? 0   Dressing or bathing? 0   Doing errands, shopping? 0   Preparing Food and eating ? N   Using the Toilet? N   In the past six months, have you accidently leaked urine? N   Do you have problems with loss of bowel control? N   Managing your Medications? N   Managing your Finances? N   Housekeeping or managing your Housekeeping? N     Patient Care Team: Manette Section, MD as PCP - General (Family Medicine) Sheryle Donning, MD as PCP - Cardiology (Cardiology) Riley Cheadle Windsor Hatcher, MD as Consulting Physician (Gastroenterology) Pleasant, Evangeline Hilts, PA as Consulting Physician (Gastroenterology) Mannam, Praveen, MD as Consulting Physician (Pulmonary Disease)  Indicate any recent Medical Services you may have received from other than [facility] providers in the past year (date may be approximate).     Assessment:   This is a routine wellness examination for Riddhi.  Hearing/Vision screen No results found.   Goals Addressed             This Visit's Progress    Patient Stated       Better caring person      Depression Screen    07/08/2023    1:35 PM 07/08/2023    1:34 PM 11/06/2022    2:23 PM 09/17/2022   10:10 AM 08/20/2022    3:43 PM 07/04/2022    9:10 AM 07/25/2021    4:05 PM  PHQ 2/9 Scores  PHQ -  2 Score 0 0 0 0 0 0 0  PHQ- 9 Score   0 2 3      Fall Risk    07/08/2023    1:36 PM 11/06/2022    2:17 PM 09/17/2022   10:10 AM 08/20/2022    3:43 PM 07/04/2022    9:08  AM  Fall Risk   Falls in the past year? 0 0 0 0 0  Number falls in past yr: 0 0   0  Injury with Fall? 0 0   0  Risk for fall due to : No Fall Risks No Fall Risks   No Fall Risks  Follow up Falls evaluation completed Falls evaluation completed Falls prevention discussed  Falls prevention discussed    MEDICARE RISK AT HOME: Medicare Risk at Home Any stairs in or around the home?: Yes If so, are there any without handrails?: Yes Home free of loose throw rugs in walkways, pet beds, electrical cords, etc?: Yes Adequate lighting in your home to reduce risk of falls?: Yes Life alert?: No Use of a cane, walker or w/c?: No Grab bars in the bathroom?: No Shower chair or bench in shower?: Yes Elevated toilet seat or a handicapped toilet?: No  TIMED UP AND GO:  Was the test performed?  No    Cognitive Function:        07/08/2023    1:37 PM 07/04/2022    9:11 AM 06/28/2021    8:26 AM 06/03/2019   10:08 AM 06/02/2018    9:23 AM  6CIT Screen  What Year? 0 points 0 points 0 points 0 points 0 points  What month? 0 points 0 points 0 points 0 points 0 points  What time? 0 points 0 points 0 points 0 points 0 points  Count back from 20 0 points 0 points 0 points 0 points 0 points  Months in reverse 0 points 0 points 0 points 0 points 4 points  Repeat phrase 10 points 0 points 0 points 0 points 0 points  Total Score 10 points 0 points 0 points 0 points 4 points    Immunizations Immunization History  Administered Date(s) Administered   Fluad Quad(high Dose 65+) 12/01/2021   Influenza, High Dose Seasonal PF 11/15/2015, 02/24/2017, 12/04/2017   Influenza,inj,Quad PF,6+ Mos 02/19/2017   Influenza-Unspecified 01/06/2020, 11/18/2020   PFIZER Comirnaty(Gray Top)Covid-19 Tri-Sucrose Vaccine 11/03/2022   PFIZER(Purple Top)SARS-COV-2 Vaccination 02/24/2019, 03/17/2019, 11/29/2019   Pneumococcal Conjugate-13 06/23/2019   Pneumococcal Polysaccharide-23 03/28/2016   Tdap 06/15/2015   Zoster  Recombinant(Shingrix ) 05/31/2021, 08/10/2021, 08/11/2022   Zoster, Live 08/10/2021    TDAP status: Up to date  Flu Vaccine status: Up to date  Pneumococcal vaccine status: Up to date  Covid-19 vaccine status: Completed vaccines  Qualifies for Shingles Vaccine? No   Zostavax completed Yes   Shingrix  Completed?: No.    Education has been provided regarding the importance of this vaccine. Patient has been advised to call insurance company to determine out of pocket expense if they have not yet received this vaccine. Advised may also receive vaccine at local pharmacy or Health Dept. Verbalized acceptance and understanding.  Screening Tests Health Maintenance  Topic Date Due   COVID-19 Vaccine (5 - 2024-25 season) 07/24/2023 (Originally 12/29/2022)   INFLUENZA VACCINE  09/06/2023   Colonoscopy  11/26/2023   MAMMOGRAM  03/17/2024   Medicare Annual Wellness (AWV)  07/07/2024   DTaP/Tdap/Td (2 - Td or Tdap) 06/14/2025   DEXA SCAN  06/21/2025   Pneumonia Vaccine 65+  Years old  Completed   Hepatitis C Screening  Completed   Zoster Vaccines- Shingrix   Completed   HPV VACCINES  Aged Out   Meningococcal B Vaccine  Aged Out    Health Maintenance  There are no preventive care reminders to display for this patient.   Colorectal cancer screening: Type of screening: Colonoscopy. Completed 11/2018. Repeat every 5 years  Mammogram status: Completed 03/2023. Repeat every year  Bone Density status: Completed 06/2020. Results reflect: Bone density results: NORMAL. Repeat every 5 years.  Lung Cancer Screening: (Low Dose CT Chest recommended if Age 6-80 years, 20 pack-year currently smoking OR have quit w/in 15years.) does not qualify.   Lung Cancer Screening Referral: n/a  Additional Screening:  Hepatitis C Screening: does not qualify; Completed 06/13/2016  Vision Screening: Recommended annual ophthalmology exams for early detection of glaucoma and other disorders of the eye. Is the patient  up to date with their annual eye exam?  Yes  Who is the provider or what is the name of the office in which the patient attends annual eye exams? ? If pt is not established with a provider, would they like to be referred to a provider to establish care? No .   Dental Screening: Recommended annual dental exams for proper oral hygiene  Diabetic Foot Exam: N/A  Community Resource Referral / Chronic Care Management: CRR required this visit?  No   CCM required this visit?  No     Plan:     I have personally reviewed and noted the following in the patient's chart:   Medical and social history Use of alcohol, tobacco or illicit drugs  Current medications and supplements including opioid prescriptions. Patient is not currently taking opioid prescriptions. Functional ability and status Nutritional status Physical activity Advanced directives List of other physicians Hospitalizations, surgeries, and ER visits in previous 12 months Vitals Screenings to include cognitive, depression, and falls Referrals and appointments  In addition, I have reviewed and discussed with patient certain preventive protocols, quality metrics, and best practice recommendations. A written personalized care plan for preventive services as well as general preventive health recommendations were provided to patient.     Manette Section, MD   07/08/2023   After Visit Summary: (Declined) Due to this being a telephonic visit, with patients personalized plan was offered to patient but patient Declined AVS at this time   Nurse Notes: N/A

## 2023-07-09 ENCOUNTER — Ambulatory Visit: Attending: Orthopaedic Surgery

## 2023-07-09 DIAGNOSIS — M25612 Stiffness of left shoulder, not elsewhere classified: Secondary | ICD-10-CM | POA: Diagnosis not present

## 2023-07-09 DIAGNOSIS — M25512 Pain in left shoulder: Secondary | ICD-10-CM | POA: Insufficient documentation

## 2023-07-09 NOTE — Therapy (Signed)
 OUTPATIENT PHYSICAL THERAPY SHOULDER TREATMENT   Patient Name: Diane Grant MRN: [id number] DOB:[birth year], 73 y.o., female Today's Date: 07/09/2023  END OF SESSION:  PT End of Session - 07/09/23 0848     Visit Number 7    Number of Visits 12    Date for PT Re-Evaluation 08/30/23    PT Start Time 0845    PT Stop Time 0940    PT Time Calculation (min) 55 min               Past Medical History:  Diagnosis Date   Allergy     Asthma    Degenerative disc disease, lumbar    GERD (gastroesophageal reflux disease)    Hypertension    Hypothyroidism    Thyroid  disease    hypothyroidism   Ulcer    Past Surgical History:  Procedure Laterality Date   ABDOMINAL HYSTERECTOMY     DILATION AND CURETTAGE OF UTERUS     REVERSE SHOULDER ARTHROPLASTY Left 06/11/2023   Procedure: ARTHROPLASTY, SHOULDER, TOTAL, REVERSE;  Surgeon: Wilhelmenia Harada, MD;  Location: [facility];  Service: Orthopedics;  Laterality: Left;   ROTATOR CUFF REPAIR  11/30/2015   Right shoulder   Patient Active Problem List   Diagnosis Date Noted   Nontraumatic complete tear of left rotator cuff 06/11/2023   Moderate persistent asthma without complication 04/24/2023   Laryngopharyngeal reflux (LPR) 04/24/2023   Allergy  to alpha-gal 04/24/2023   Second hand tobacco smoke exposure 06/15/2021   Other allergic rhinitis 06/15/2021   Other adverse food reactions, not elsewhere classified, subsequent encounter 06/15/2021   Irritable bowel syndrome with constipation 11/16/2020   Cough 03/29/2020   Acute pharyngitis 03/29/2020   Urticaria 11/27/2019   Palpitations 06/16/2018   Chest pain of uncertain etiology 06/16/2018   Shortness of breath 06/16/2018   Educated about COVID-19 virus infection 06/16/2018   Change in voice 03/08/2017   Degenerative disc disease, lumbar 03/08/2017   Hypothyroidism 06/26/2013   Essential hypertension 06/26/2013    PCP: Manette Section, MD  REFERRING  PROVIDER: Wilhelmenia Harada, MD   REFERRING DIAG: Nontraumatic complete tear of left rotator cuff   THERAPY DIAG:  Acute pain of left shoulder  Stiffness of left shoulder, not elsewhere classified  Rationale for Evaluation and Treatment: Rehabilitation  ONSET DATE: 06/11/23  SUBJECTIVE:                                                                                                                                                                                      SUBJECTIVE STATEMENT: Patient reports 1-2/10 left shoulder pain today.   Hand dominance: Right  PERTINENT HISTORY: Hypertension, asthma, and  allergies  PAIN:  Are you having pain? Yes: NPRS scale: 1-2/10 Pain location: left shoulder and arm Pain description: constant aching and throbbing Aggravating factors: coughing Relieving factors: ice and medication   PRECAUTIONS: Shoulder  RED FLAGS: None   WEIGHT BEARING RESTRICTIONS: NWB on LUE  FALLS:  Has patient fallen in last 6 months? No  LIVING ENVIRONMENT: Lives with: lives with their family Lives in: House/apartment Has following equipment at home: sling with abduction pillow  OCCUPATION: Retired   PLOF: Independent  PATIENT GOALS:be able garden and working outside, reduced pain, and improved mobility  NEXT MD VISIT: 06/21/23  OBJECTIVE:  Note: Objective measures were completed at Evaluation unless otherwise noted.  DIAGNOSTIC FINDINGS: 06/11/23 left shoulder x-ray IMPRESSION: Baseline radiograph status post left reverse shoulder arthroplasty.  PATIENT SURVEYS:  Quick Dash 95.45  COGNITION: Overall cognitive status: Within functional limits for tasks assessed     SENSATION: Patient reports numbness in her left forearm and hand  UPPER EXTREMITY ROM:   Active ROM Right eval Left Eval (PROM)  Shoulder flexion 126 60  Shoulder extension    Shoulder abduction 126 55  Shoulder adduction    Shoulder internal rotation    Shoulder external  rotation 30 5  Elbow flexion    Elbow extension    Wrist flexion    Wrist extension    Wrist ulnar deviation    Wrist radial deviation    Wrist pronation    Wrist supination    (Blank rows = not tested)  UPPER EXTREMITY MMT: not tested due to surgical condition  SHOULDER SPECIAL TESTS: Not tested due to surgical condition  PALPATION:  TTP: left upper trapezius, supraspinatus, infraspinatus, deltoid, biceps, and triceps                                                                                                                             TREATMENT DATE:   07/09/23    EXERCISE LOG   LT shldr   RTS  Exercise Repetitions and Resistance Comments  Pulleys  6 minutes   UE ranger (seated) Flex/ext; CW and CCW circles x 2.5 mins each   Wall Ladder 5 reps (max 21)   AA Flexion  seated  Cane 2 sets of 15 reps   AA Chest Press seated Cane 2 sets of 15 reps    AA ER with cane  3 minutes   AA Abduction with cane 3 mins   Ball squeeze     Bicep Curls 3-way 2# x 25 reps each way   Therabard    Ball roll out on mat with wedge    Scapular retraction    Cane punch out      Blank cell = exercise not performed today   Manual Therapy Passive ROM: left shoulder, PROM into flexion and ER with gentle hold at end range to tolerance   Modalities  Date:  Vaso: Shoulder, 34 degrees; low pressure, 15 mins, Pain  06/17/23 EXERCISE LOG  Exercise Repetitions and Resistance Comments  Towel squeeze 20 reps  50% max effort  Elbow flexion  20 reps   Wrist flexion/extension 2 minutes   Elbow hang  5 minutes For elbow extension  Manual therapy  See below    Blank cell = exercise not performed today  Manual Therapy Soft Tissue Mobilization: left biceps, triceps, infraspinatus, and deltoid, for reduced pain and tone Passive ROM: left shoulder flexion, abduction, and ER along with left elbow flexion and extension, to tolerance within protocol   PATIENT  EDUCATION: Education details: HEP, healing, and expectation for soreness Person educated: Patient Education method: Explanation Education comprehension: verbalized understanding  HOME EXERCISE PROGRAM:   ASSESSMENT:  CLINICAL IMPRESSION: Pt arrives for today's treatment session reporting 1-2/10 left shoulder pain.  Pt able to reach a max height of 21 on the wall ladder today.  Pt reports performing supine AA and active exercises at home with good results.  Pt able to tolerate increased reps with several AA exercises today.  PROM performed into flexion and ER with gentle hold at end range to tolerance.  Normal responses to vaso noted upon removal.  Pt reported minimal pain at completion of today's treatment session.   OBJECTIVE IMPAIRMENTS: decreased activity tolerance, decreased ROM, decreased strength, hypomobility, impaired sensation, impaired tone, impaired UE functional use, and pain.   ACTIVITY LIMITATIONS: carrying, lifting, sleeping, bathing, dressing, reach over head, and hygiene/grooming  PARTICIPATION LIMITATIONS: meal prep, cleaning, laundry, shopping, community activity, and yard work  PERSONAL FACTORS: Age, Past/current experiences, Time since onset of injury/illness/exacerbation, Transportation, and 3+ comorbidities: Hypertension, asthma, and allergies are also affecting patient's functional outcome.   REHAB POTENTIAL: Good  CLINICAL DECISION MAKING: Evolving/moderate complexity  EVALUATION COMPLEXITY: Moderate   GOALS: Goals reviewed with patient? Yes  SHORT TERM GOALS: Target date: 07/05/23  Patient will be independent with her initial HEP.  Baseline: Goal status: MET  2.  Patient will be able to demonstrate at least 90 degrees of left shoulder flexion for improved function reaching.  Baseline: 5/29: AA 98 degrees Goal status: PARTIALLY MET  3.  Patient will be able to demonstrate at least 90 degrees of left shoulder abduction for improved function reaching.   Baseline:  Goal status: On going  4.  Patient will improve her QuickDASH score to 75 or less for improved perceived function with her daily activities.  Baseline:  Goal status: On going  LONG TERM GOALS: Target date: 07/26/23  Patient will be independent with her advanced HEP.  Baseline:  Goal status: IN PROGRESS  2.  Patient will be able to demonstrate at least 120 degrees of active left shoulder flexion for improved function reaching overhead.  Baseline:  Goal status: IN PROGRESS  3.  Patient will be able to demonstrate at least 120 degrees of active left shoulder abduction for improved function reaching overhead.  Baseline:  Goal status: IN PROGRESS  4.  Patient will be able to carry at least 5 pounds in her left hand for improved function gardening.  Baseline:  Goal status: IN PROGRESS  5.  Patient will improve her QuickDASH score to 50 or less for improved perceived function with her daily activities.  Baseline:  Goal status: IN PROGRESS  PLAN:  PT FREQUENCY: 2x/week  PT DURATION: 6 weeks  PLANNED INTERVENTIONS: 97164- PT Re-evaluation, 97750- Physical Performance Testing, 97110-Therapeutic exercises, 97530- Therapeutic activity, [id number]- Neuromuscular re-education, 97535- Self Care, [id number]- Manual therapy, [id number]- Electrical stimulation (unattended), 97016- Vasopneumatic device, Patient/Family education,  Joint mobilization, Cryotherapy, and Moist heat  PLAN FOR NEXT SESSION: PROM, manual therapy, pendulums, scapular retraction, and modalities as needed   Deryl Flora, PTA 07/09/2023, 9:41 AM

## 2023-07-11 ENCOUNTER — Ambulatory Visit

## 2023-07-11 DIAGNOSIS — M25612 Stiffness of left shoulder, not elsewhere classified: Secondary | ICD-10-CM

## 2023-07-11 DIAGNOSIS — M25512 Pain in left shoulder: Secondary | ICD-10-CM | POA: Diagnosis not present

## 2023-07-11 NOTE — Therapy (Signed)
 OUTPATIENT PHYSICAL THERAPY SHOULDER TREATMENT   Patient Name: Diane Grant MRN: [id number] DOB:1950-04-27, 73 y.o., female Today's Date: 07/11/2023  END OF SESSION:  PT End of Session - 07/11/23 1103     Visit Number 8    Number of Visits 12    Date for PT Re-Evaluation 08/30/23    PT Start Time 1100    PT Stop Time 1149    PT Time Calculation (min) 49 min               Past Medical History:  Diagnosis Date   Allergy     Asthma    Degenerative disc disease, lumbar    GERD (gastroesophageal reflux disease)    Hypertension    Hypothyroidism    Thyroid  disease    hypothyroidism   Ulcer    Past Surgical History:  Procedure Laterality Date   ABDOMINAL HYSTERECTOMY     DILATION AND CURETTAGE OF UTERUS     REVERSE SHOULDER ARTHROPLASTY Left 06/11/2023   Procedure: ARTHROPLASTY, SHOULDER, TOTAL, REVERSE;  Surgeon: Wilhelmenia Harada, MD;  Location: [facility];  Service: Orthopedics;  Laterality: Left;   ROTATOR CUFF REPAIR  11/30/2015   Right shoulder   Patient Active Problem List   Diagnosis Date Noted   Nontraumatic complete tear of left rotator cuff 06/11/2023   Moderate persistent asthma without complication 04/24/2023   Laryngopharyngeal reflux (LPR) 04/24/2023   Allergy  to alpha-gal 04/24/2023   Second hand tobacco smoke exposure 06/15/2021   Other allergic rhinitis 06/15/2021   Other adverse food reactions, not elsewhere classified, subsequent encounter 06/15/2021   Irritable bowel syndrome with constipation 11/16/2020   Cough 03/29/2020   Acute pharyngitis 03/29/2020   Urticaria 11/27/2019   Palpitations 06/16/2018   Chest pain of uncertain etiology 06/16/2018   Shortness of breath 06/16/2018   Educated about COVID-19 virus infection 06/16/2018   Change in voice 03/08/2017   Degenerative disc disease, lumbar 03/08/2017   Hypothyroidism 06/26/2013   Essential hypertension 06/26/2013    PCP: Manette Section, MD  REFERRING  PROVIDER: Wilhelmenia Harada, MD   REFERRING DIAG: Nontraumatic complete tear of left rotator cuff   THERAPY DIAG:  Acute pain of left shoulder  Stiffness of left shoulder, not elsewhere classified  Rationale for Evaluation and Treatment: Rehabilitation  ONSET DATE: 06/11/23  SUBJECTIVE:                                                                                                                                                                                      SUBJECTIVE STATEMENT: Patient reports 3-4/10 left shoulder pain today.   Hand dominance: Right  PERTINENT HISTORY: Hypertension, asthma, and  allergies  PAIN:  Are you having pain? Yes: NPRS scale: 3-4/10 Pain location: left shoulder and arm Pain description: constant aching and throbbing Aggravating factors: coughing Relieving factors: ice and medication   PRECAUTIONS: Shoulder  RED FLAGS: None   WEIGHT BEARING RESTRICTIONS: NWB on LUE  FALLS:  Has patient fallen in last 6 months? No  LIVING ENVIRONMENT: Lives with: lives with their family Lives in: House/apartment Has following equipment at home: sling with abduction pillow  OCCUPATION: Retired   PLOF: Independent  PATIENT GOALS:be able garden and working outside, reduced pain, and improved mobility  NEXT MD VISIT: 06/21/23  OBJECTIVE:  Note: Objective measures were completed at Evaluation unless otherwise noted.  DIAGNOSTIC FINDINGS: 06/11/23 left shoulder x-ray IMPRESSION: Baseline radiograph status post left reverse shoulder arthroplasty.  PATIENT SURVEYS:  Quick Dash 95.45  COGNITION: Overall cognitive status: Within functional limits for tasks assessed     SENSATION: Patient reports numbness in her left forearm and hand  UPPER EXTREMITY ROM:   Active ROM Right eval Left Eval (PROM)  Shoulder flexion 126 60  Shoulder extension    Shoulder abduction 126 55  Shoulder adduction    Shoulder internal rotation    Shoulder external  rotation 30 5  Elbow flexion    Elbow extension    Wrist flexion    Wrist extension    Wrist ulnar deviation    Wrist radial deviation    Wrist pronation    Wrist supination    (Blank rows = not tested)  UPPER EXTREMITY MMT: not tested due to surgical condition  SHOULDER SPECIAL TESTS: Not tested due to surgical condition  PALPATION:  TTP: left upper trapezius, supraspinatus, infraspinatus, deltoid, biceps, and triceps                                                                                                                             TREATMENT DATE:   07/11/23    EXERCISE LOG   LT shldr   RTS  Exercise Repetitions and Resistance Comments  Pulleys  6 minutes   UE ranger (seated) Flex/ext; CW and CCW circles x 3 mins each   Wall Ladder 5 reps (max 22)   AA Flexion  seated  Cane 2 sets of 15 reps   AA Chest Press seated Cane 2 sets of 15 reps    AA ER with cane  3 minutes   AA Abduction with cane 3 mins   Ball squeeze     Bicep Curls 3-way    Therabard    Ball roll out on mat with wedge    Scapular retraction    Cane punch out      Blank cell = exercise not performed today   Modalities  Date:  Unattended Estim: Shoulder, IFC 80-150 Hz, 15 mins, Pain and Tone Vaso: Shoulder, 34 degrees; low pressure, 15 mins, Pain  06/17/23 EXERCISE LOG  Exercise Repetitions and Resistance Comments  Towel squeeze 20 reps  50% max effort  Elbow flexion  20 reps   Wrist flexion/extension 2 minutes   Elbow hang  5 minutes For elbow extension  Manual therapy  See below    Blank cell = exercise not performed today  Manual Therapy Soft Tissue Mobilization: left biceps, triceps, infraspinatus, and deltoid, for reduced pain and tone Passive ROM: left shoulder flexion, abduction, and ER along with left elbow flexion and extension, to tolerance within protocol   PATIENT EDUCATION: Education details: HEP, healing, and expectation for soreness Person  educated: Patient Education method: Explanation Education comprehension: verbalized understanding  HOME EXERCISE PROGRAM:   ASSESSMENT:  CLINICAL IMPRESSION: Pt arrives for today's treatment session reporting 3-4/10 left shoulder pain.  Pt able to tolerate increased time with UE ranger today with minimal discomfort.  Pt able to decrease QuickDash to 56.8 %, meeting her STG and making good progress towards her LTG.  Normal responses to estim and vaso noted upon removal.  Pt reports 2-3/10 left shoulder pain at completion of today's treatment session.  OBJECTIVE IMPAIRMENTS: decreased activity tolerance, decreased ROM, decreased strength, hypomobility, impaired sensation, impaired tone, impaired UE functional use, and pain.   ACTIVITY LIMITATIONS: carrying, lifting, sleeping, bathing, dressing, reach over head, and hygiene/grooming  PARTICIPATION LIMITATIONS: meal prep, cleaning, laundry, shopping, community activity, and yard work  PERSONAL FACTORS: Age, Past/current experiences, Time since onset of injury/illness/exacerbation, Transportation, and 3+ comorbidities: Hypertension, asthma, and allergies are also affecting patient's functional outcome.   REHAB POTENTIAL: Good  CLINICAL DECISION MAKING: Evolving/moderate complexity  EVALUATION COMPLEXITY: Moderate   GOALS: Goals reviewed with patient? Yes  SHORT TERM GOALS: Target date: 07/05/23  Patient will be independent with her initial HEP.  Baseline: Goal status: MET  2.  Patient will be able to demonstrate at least 90 degrees of left shoulder flexion for improved function reaching.  Baseline: 5/29: AA 98 degrees Goal status: PARTIALLY MET  3.  Patient will be able to demonstrate at least 90 degrees of left shoulder abduction for improved function reaching.  Baseline: 6/5: AA 92 degrees Goal status: PARTIALLY MET  4.  Patient will improve her QuickDASH score to 75 or less for improved perceived function with her daily  activities.  Baseline: 6/5: 56.8  Goal status: MET  LONG TERM GOALS: Target date: 07/26/23  Patient will be independent with her advanced HEP.  Baseline:  Goal status: IN PROGRESS  2.  Patient will be able to demonstrate at least 120 degrees of active left shoulder flexion for improved function reaching overhead.  Baseline:  Goal status: IN PROGRESS  3.  Patient will be able to demonstrate at least 120 degrees of active left shoulder abduction for improved function reaching overhead.  Baseline:  Goal status: IN PROGRESS  4.  Patient will be able to carry at least 5 pounds in her left hand for improved function gardening.  Baseline:  Goal status: IN PROGRESS  5.  Patient will improve her QuickDASH score to 50 or less for improved perceived function with her daily activities.  Baseline:  Goal status: IN PROGRESS  PLAN:  PT FREQUENCY: 2x/week  PT DURATION: 6 weeks  PLANNED INTERVENTIONS: 97164- PT Re-evaluation, 97750- Physical Performance Testing, 97110-Therapeutic exercises, 97530- Therapeutic activity, [id number]- Neuromuscular re-education, 97535- Self Care, [id number]- Manual therapy, [id number]- Electrical stimulation (unattended), 97016- Vasopneumatic device, Patient/Family education, Joint mobilization, Cryotherapy, and Moist heat  PLAN FOR NEXT SESSION: PROM, manual therapy, pendulums, scapular  retraction, and modalities as needed   Deryl Flora, PTA 07/11/2023, 11:54 AM

## 2023-07-14 ENCOUNTER — Encounter: Payer: Self-pay | Admitting: Family Medicine

## 2023-07-16 ENCOUNTER — Ambulatory Visit: Admitting: Physical Therapy

## 2023-07-16 DIAGNOSIS — M25612 Stiffness of left shoulder, not elsewhere classified: Secondary | ICD-10-CM

## 2023-07-16 DIAGNOSIS — M25512 Pain in left shoulder: Secondary | ICD-10-CM

## 2023-07-16 MED ORDER — LEVOTHYROXINE SODIUM 75 MCG PO TABS: 75.0000 ug | ORAL_TABLET | Freq: Every day | ORAL | 1 refills | Status: AC

## 2023-07-16 NOTE — Addendum Note (Signed)
 Addended by: Manette Section on: 07/16/2023 01:26 PM   Modules accepted: Orders

## 2023-07-16 NOTE — Therapy (Signed)
 OUTPATIENT PHYSICAL THERAPY SHOULDER TREATMENT   Patient Name: Diane Grant MRN: [id number] DOB:04-01-1950, 73 y.o., female Today's Date: 07/16/2023  END OF SESSION:  PT End of Session - 07/16/23 1020     Visit Number 9    Number of Visits 12    Date for PT Re-Evaluation 08/30/23    PT Start Time 1015    PT Stop Time 1104    PT Time Calculation (min) 49 min    Activity Tolerance Patient tolerated treatment well    Behavior During Therapy WFL for tasks assessed/performed               Past Medical History:  Diagnosis Date   Allergy     Asthma    Degenerative disc disease, lumbar    GERD (gastroesophageal reflux disease)    Hypertension    Hypothyroidism    Thyroid  disease    hypothyroidism   Ulcer    Past Surgical History:  Procedure Laterality Date   ABDOMINAL HYSTERECTOMY     DILATION AND CURETTAGE OF UTERUS     REVERSE SHOULDER ARTHROPLASTY Left 06/11/2023   Procedure: ARTHROPLASTY, SHOULDER, TOTAL, REVERSE;  Surgeon: Wilhelmenia Harada, MD;  [facility];  Service: Orthopedics;  Laterality: Left;   ROTATOR CUFF REPAIR  11/30/2015   Right shoulder   Patient Active Problem List   Diagnosis Date Noted   Nontraumatic complete tear of left rotator cuff 06/11/2023   Moderate persistent asthma without complication 04/24/2023   Laryngopharyngeal reflux (LPR) 04/24/2023   Allergy  to alpha-gal 04/24/2023   Second hand tobacco smoke exposure 06/15/2021   Other allergic rhinitis 06/15/2021   Other adverse food reactions, not elsewhere classified, subsequent encounter 06/15/2021   Irritable bowel syndrome with constipation 11/16/2020   Cough 03/29/2020   Acute pharyngitis 03/29/2020   Urticaria 11/27/2019   Palpitations 06/16/2018   Chest pain of uncertain etiology 06/16/2018   Shortness of breath 06/16/2018   Educated about COVID-19 virus infection 06/16/2018   Change in voice 03/08/2017   Degenerative disc disease, lumbar  03/08/2017   Hypothyroidism 06/26/2013   Essential hypertension 06/26/2013    PCP: Manette Section, MD  REFERRING PROVIDER: Wilhelmenia Harada, MD   REFERRING DIAG: Nontraumatic complete tear of left rotator cuff   THERAPY DIAG:  Acute pain of left shoulder  Stiffness of left shoulder, not elsewhere classified  Rationale for Evaluation and Treatment: Rehabilitation  ONSET DATE: 06/11/23  SUBJECTIVE:                                                                                                                                                                                      SUBJECTIVE  STATEMENT: Shoulder feels stiff. Hand dominance: Right  PERTINENT HISTORY: Hypertension, asthma, and allergies  PAIN:  Are you having pain? Yes: NPRS scale: 3-4/10 Pain location: left shoulder and arm Pain description: constant aching and throbbing Aggravating factors: coughing Relieving factors: ice and medication   PRECAUTIONS: Shoulder  RED FLAGS: None   WEIGHT BEARING RESTRICTIONS: NWB on LUE  FALLS:  Has patient fallen in last 6 months? No  LIVING ENVIRONMENT: Lives with: lives with their family Lives in: House/apartment Has following equipment at home: sling with abduction pillow  OCCUPATION: Retired   PLOF: Independent  PATIENT GOALS:be able garden and working outside, reduced pain, and improved mobility  NEXT MD VISIT: 06/21/23  OBJECTIVE:  Note: Objective measures were completed at Evaluation unless otherwise noted.  DIAGNOSTIC FINDINGS: 06/11/23 left shoulder x-ray IMPRESSION: Baseline radiograph status post left reverse shoulder arthroplasty.  PATIENT SURVEYS:  Quick Dash 95.45  COGNITION: Overall cognitive status: Within functional limits for tasks assessed     SENSATION: Patient reports numbness in her left forearm and hand  UPPER EXTREMITY ROM:   Active ROM Right eval Left Eval (PROM)  Shoulder flexion 126 60  Shoulder extension    Shoulder  abduction 126 55  Shoulder adduction    Shoulder internal rotation    Shoulder external rotation 30 5  Elbow flexion    Elbow extension    Wrist flexion    Wrist extension    Wrist ulnar deviation    Wrist radial deviation    Wrist pronation    Wrist supination    (Blank rows = not tested)  UPPER EXTREMITY MMT: not tested due to surgical condition  SHOULDER SPECIAL TESTS: Not tested due to surgical condition  PALPATION:  TTP: left upper trapezius, supraspinatus, infraspinatus, deltoid, biceps, and triceps                                                                                                                             TREATMENT DATE:   07/15/23:  Pulleys into flexion x 6 minutes f/b seated UE Ranger x 6 minutes f/b gentle PROM into left shoulder flexion and and ER x 13 minutes per protocol guidelines f/b low-level (non-motoric) IFC at 80-150 Hz on 40% scan x 15 minutes.      07/11/23    EXERCISE LOG   LT shldr   RTS  Exercise Repetitions and Resistance Comments  Pulleys  6 minutes   UE ranger (seated) Flex/ext; CW and CCW circles x 3 mins each   Wall Ladder 5 reps (max 22)   AA Flexion  seated  Cane 2 sets of 15 reps   AA Chest Press seated Cane 2 sets of 15 reps    AA ER with cane  3 minutes   AA Abduction with cane 3 mins   Ball squeeze     Bicep Curls 3-way    Therabard    Ball roll out on mat with wedge  Scapular retraction    Cane punch out      Blank cell = exercise not performed today   Modalities  Date:  Unattended Estim: Shoulder, IFC 80-150 Hz, 15 mins, Pain and Tone Vaso: Shoulder, 34 degrees; low pressure, 15 mins, Pain                                    06/17/23 EXERCISE LOG  Exercise Repetitions and Resistance Comments  Towel squeeze 20 reps  50% max effort  Elbow flexion  20 reps   Wrist flexion/extension 2 minutes   Elbow hang  5 minutes For elbow extension  Manual therapy  See below    Blank cell = exercise not performed today   Manual Therapy Soft Tissue Mobilization: left biceps, triceps, infraspinatus, and deltoid, for reduced pain and tone Passive ROM: left shoulder flexion, abduction, and ER along with left elbow flexion and extension, to tolerance within protocol   PATIENT EDUCATION: Education details: HEP, healing, and expectation for soreness Person educated: Patient Education method: Explanation Education comprehension: verbalized understanding  HOME EXERCISE PROGRAM:   ASSESSMENT:  CLINICAL IMPRESSION: Patient reporting left shoulder stiffness.  She tolerated PROM per protocol guidelines without complaint.  OBJECTIVE IMPAIRMENTS: decreased activity tolerance, decreased ROM, decreased strength, hypomobility, impaired sensation, impaired tone, impaired UE functional use, and pain.   ACTIVITY LIMITATIONS: carrying, lifting, sleeping, bathing, dressing, reach over head, and hygiene/grooming  PARTICIPATION LIMITATIONS: meal prep, cleaning, laundry, shopping, community activity, and yard work  PERSONAL FACTORS: Age, Past/current experiences, Time since onset of injury/illness/exacerbation, Transportation, and 3+ comorbidities: Hypertension, asthma, and allergies are also affecting patient's functional outcome.   REHAB POTENTIAL: Good  CLINICAL DECISION MAKING: Evolving/moderate complexity  EVALUATION COMPLEXITY: Moderate   GOALS: Goals reviewed with patient? Yes  SHORT TERM GOALS: Target date: 07/05/23  Patient will be independent with her initial HEP.  Baseline: Goal status: MET  2.  Patient will be able to demonstrate at least 90 degrees of left shoulder flexion for improved function reaching.  Baseline: 5/29: AA 98 degrees Goal status: PARTIALLY MET  3.  Patient will be able to demonstrate at least 90 degrees of left shoulder abduction for improved function reaching.  Baseline: 6/5: AA 92 degrees Goal status: PARTIALLY MET  4.  Patient will improve her QuickDASH score to 75 or less  for improved perceived function with her daily activities.  Baseline: 6/5: 56.8  Goal status: MET  LONG TERM GOALS: Target date: 07/26/23  Patient will be independent with her advanced HEP.  Baseline:  Goal status: IN PROGRESS  2.  Patient will be able to demonstrate at least 120 degrees of active left shoulder flexion for improved function reaching overhead.  Baseline:  Goal status: IN PROGRESS  3.  Patient will be able to demonstrate at least 120 degrees of active left shoulder abduction for improved function reaching overhead.  Baseline:  Goal status: IN PROGRESS  4.  Patient will be able to carry at least 5 pounds in her left hand for improved function gardening.  Baseline:  Goal status: IN PROGRESS  5.  Patient will improve her QuickDASH score to 50 or less for improved perceived function with her daily activities.  Baseline:  Goal status: IN PROGRESS  PLAN:  PT FREQUENCY: 2x/week  PT DURATION: 6 weeks  PLANNED INTERVENTIONS: 97164- PT Re-evaluation, 97750- Physical Performance Testing, 97110-Therapeutic exercises, 97530- Therapeutic activity, [id number]- Neuromuscular re-education, 97535- Self Care,  16109- Manual therapy, G0283- Electrical stimulation (unattended), 97016- Vasopneumatic device, Patient/Family education, Joint mobilization, Cryotherapy, and Moist heat  PLAN FOR NEXT SESSION: PROM, manual therapy, pendulums, scapular retraction, and modalities as needed   Lewi Drost, [place], PT 07/16/2023, 11:37 AM

## 2023-07-18 ENCOUNTER — Ambulatory Visit: Admitting: Physical Therapy

## 2023-07-18 DIAGNOSIS — M25612 Stiffness of left shoulder, not elsewhere classified: Secondary | ICD-10-CM | POA: Diagnosis not present

## 2023-07-18 DIAGNOSIS — M25512 Pain in left shoulder: Secondary | ICD-10-CM

## 2023-07-18 NOTE — Therapy (Signed)
 OUTPATIENT PHYSICAL THERAPY SHOULDER TREATMENT   Patient Name: Diane Grant MRN: [id number] DOB:1950/08/17, 73 y.o., female Today's Date: 07/18/2023  END OF SESSION:  PT End of Session - 07/18/23 1129     Visit Number 10    Number of Visits 12    Date for PT Re-Evaluation 08/30/23    PT Start Time 1015    PT Stop Time 1105    PT Time Calculation (min) 50 min    Activity Tolerance Patient tolerated treatment well    Behavior During Therapy WFL for tasks assessed/performed             Past Medical History:  Diagnosis Date   Allergy     Asthma    Degenerative disc disease, lumbar    GERD (gastroesophageal reflux disease)    Hypertension    Hypothyroidism    Thyroid  disease    hypothyroidism   Ulcer    Past Surgical History:  Procedure Laterality Date   ABDOMINAL HYSTERECTOMY     DILATION AND CURETTAGE OF UTERUS     REVERSE SHOULDER ARTHROPLASTY Left 06/11/2023   Procedure: ARTHROPLASTY, SHOULDER, TOTAL, REVERSE;  Surgeon: Wilhelmenia Harada, MD;  Location: [facility];  Service: Orthopedics;  Laterality: Left;   ROTATOR CUFF REPAIR  11/30/2015   Right shoulder   Patient Active Problem List   Diagnosis Date Noted   Nontraumatic complete tear of left rotator cuff 06/11/2023   Moderate persistent asthma without complication 04/24/2023   Laryngopharyngeal reflux (LPR) 04/24/2023   Allergy  to alpha-gal 04/24/2023   Second hand tobacco smoke exposure 06/15/2021   Other allergic rhinitis 06/15/2021   Other adverse food reactions, not elsewhere classified, subsequent encounter 06/15/2021   Irritable bowel syndrome with constipation 11/16/2020   Cough 03/29/2020   Acute pharyngitis 03/29/2020   Urticaria 11/27/2019   Palpitations 06/16/2018   Chest pain of uncertain etiology 06/16/2018   Shortness of breath 06/16/2018   Educated about COVID-19 virus infection 06/16/2018   Change in voice 03/08/2017   Degenerative disc disease, lumbar  03/08/2017   Hypothyroidism 06/26/2013   Essential hypertension 06/26/2013    PCP: Manette Section, MD  REFERRING PROVIDER: Wilhelmenia Harada, MD   REFERRING DIAG: Nontraumatic complete tear of left rotator cuff   THERAPY DIAG:  Acute pain of left shoulder  Stiffness of left shoulder, not elsewhere classified  Rationale for Evaluation and Treatment: Rehabilitation  ONSET DATE: 06/11/23  SUBJECTIVE:                                                                                                                                                                                      SUBJECTIVE STATEMENT: Shoulder  feels stiff. Hand dominance: Right  PERTINENT HISTORY: Hypertension, asthma, and allergies  PAIN:  Are you having pain? Yes: NPRS scale: 3-4/10 Pain location: left shoulder and arm Pain description: constant aching and throbbing Aggravating factors: coughing Relieving factors: ice and medication   PRECAUTIONS: Shoulder  RED FLAGS: None   WEIGHT BEARING RESTRICTIONS: NWB on LUE  FALLS:  Has patient fallen in last 6 months? No  LIVING ENVIRONMENT: Lives with: lives with their family Lives in: House/apartment Has following equipment at home: sling with abduction pillow  OCCUPATION: Retired   PLOF: Independent  PATIENT GOALS:be able garden and working outside, reduced pain, and improved mobility  NEXT MD VISIT: 06/21/23  OBJECTIVE:  Note: Objective measures were completed at Evaluation unless otherwise noted.  DIAGNOSTIC FINDINGS: 06/11/23 left shoulder x-ray IMPRESSION: Baseline radiograph status post left reverse shoulder arthroplasty.  PATIENT SURVEYS:  Quick Dash 95.45  COGNITION: Overall cognitive status: Within functional limits for tasks assessed     SENSATION: Patient reports numbness in her left forearm and hand  UPPER EXTREMITY ROM:   Active ROM Right eval Left Eval (PROM)  Shoulder flexion 126 60  Shoulder extension    Shoulder  abduction 126 55  Shoulder adduction    Shoulder internal rotation    Shoulder external rotation 30 5  Elbow flexion    Elbow extension    Wrist flexion    Wrist extension    Wrist ulnar deviation    Wrist radial deviation    Wrist pronation    Wrist supination     07/18/23:  In supine P/AROM left shoulder flexion to 125 degrees.  UPPER EXTREMITY MMT: not tested due to surgical condition  SHOULDER SPECIAL TESTS: Not tested due to surgical condition  PALPATION:  TTP: left upper trapezius, supraspinatus, infraspinatus, deltoid, biceps, and triceps                                                                                                                             TREATMENT DATE:   07/18/23:                                     EXERCISE LOG  Exercise Repetitions and Resistance Comments  Pulleys 5 minutes   UE Ranger (seated) 5 minutes   Wall ladder 4 minutes (23)   Towel on wall 4 minutes       f/b gentle PROM into left shoulder flexion and and ER x 9 minutes per protocol guidelines f/b low-level (non-motoric) IFC at 80-150 Hz on 40% scan x 15 minutes.      07/15/23:  Pulleys into flexion x 6 minutes f/b seated UE Ranger x 6 minutes f/b gentle PROM into left shoulder flexion and and ER x 13 minutes per protocol guidelines f/b low-level (non-motoric) IFC at 80-150 Hz on 40% scan x 15 minutes.      07/11/23  EXERCISE LOG   LT shldr   RTS  Exercise Repetitions and Resistance Comments  Pulleys  6 minutes   UE ranger (seated) Flex/ext; CW and CCW circles x 3 mins each   Wall Ladder 5 reps (max 22)   AA Flexion  seated  Cane 2 sets of 15 reps   AA Chest Press seated Cane 2 sets of 15 reps    AA ER with cane  3 minutes   AA Abduction with cane 3 mins   Ball squeeze     Bicep Curls 3-way    Therabard    Ball roll out on mat with wedge    Scapular retraction    Cane punch out      Blank cell = exercise not performed today   Modalities  Date:  Unattended Estim:  Shoulder, IFC 80-150 Hz, 15 mins, Pain and Tone Vaso: Shoulder, 34 degrees; low pressure, 15 mins, Pain    PATIENT EDUCATION: Education details: HEP, healing, and expectation for soreness Person educated: Patient Education method: Explanation Education comprehension: verbalized understanding  HOME EXERCISE PROGRAM:   ASSESSMENT:  CLINICAL IMPRESSION: Patient reporting left shoulder stiffness.  She tolerated PROM per protocol guidelines without complaint.  She achieved #23 on the wall ladder today.  In supine P/AROM left shoulder flexion to 125 degrees.   OBJECTIVE IMPAIRMENTS: decreased activity tolerance, decreased ROM, decreased strength, hypomobility, impaired sensation, impaired tone, impaired UE functional use, and pain.   ACTIVITY LIMITATIONS: carrying, lifting, sleeping, bathing, dressing, reach over head, and hygiene/grooming  PARTICIPATION LIMITATIONS: meal prep, cleaning, laundry, shopping, community activity, and yard work  PERSONAL FACTORS: Age, Past/current experiences, Time since onset of injury/illness/exacerbation, Transportation, and 3+ comorbidities: Hypertension, asthma, and allergies are also affecting patient's functional outcome.   REHAB POTENTIAL: Good  CLINICAL DECISION MAKING: Evolving/moderate complexity  EVALUATION COMPLEXITY: Moderate   GOALS: Goals reviewed with patient? Yes  SHORT TERM GOALS: Target date: 07/05/23  Patient will be independent with her initial HEP.  Baseline: Goal status: MET  2.  Patient will be able to demonstrate at least 90 degrees of left shoulder flexion for improved function reaching.  Baseline: 5/29: AA 98 degrees Goal status: PARTIALLY MET  3.  Patient will be able to demonstrate at least 90 degrees of left shoulder abduction for improved function reaching.  Baseline: 6/5: AA 92 degrees Goal status: PARTIALLY MET  4.  Patient will improve her QuickDASH score to 75 or less for improved perceived function with her  daily activities.  Baseline: 6/5: 56.8  Goal status: MET  LONG TERM GOALS: Target date: 07/26/23  Patient will be independent with her advanced HEP.  Baseline:  Goal status: IN PROGRESS  2.  Patient will be able to demonstrate at least 120 degrees of active left shoulder flexion for improved function reaching overhead.  Baseline:  Goal status: IN PROGRESS  3.  Patient will be able to demonstrate at least 120 degrees of active left shoulder abduction for improved function reaching overhead.  Baseline:  Goal status: IN PROGRESS  4.  Patient will be able to carry at least 5 pounds in her left hand for improved function gardening.  Baseline:  Goal status: IN PROGRESS  5.  Patient will improve her QuickDASH score to 50 or less for improved perceived function with her daily activities.  Baseline:  Goal status: IN PROGRESS  PLAN:  PT FREQUENCY: 2x/week  PT DURATION: 6 weeks  PLANNED INTERVENTIONS: 97164- PT Re-evaluation, 97750- Physical Performance Testing, 97110-Therapeutic exercises, 97530- Therapeutic  activity, [id number]- Neuromuscular re-education, [phone]- Self Care, 19147- Manual therapy, [id number]- Electrical stimulation (unattended), 97016- Vasopneumatic device, Patient/Family education, Joint mobilization, Cryotherapy, and Moist heat  PLAN FOR NEXT SESSION: PROM, manual therapy, pendulums, scapular retraction, and modalities as needed  Progress Note Reporting Period 06/14/23 to 07/18/23.  See note below for Objective Data and Assessment of Progress/Goals. Patient progressing toward goals in accord with protocol.     Gurleen Larrivee, [place], PT 07/18/2023, 11:30 AM

## 2023-07-23 ENCOUNTER — Other Ambulatory Visit: Payer: Self-pay | Admitting: Family Medicine

## 2023-07-23 ENCOUNTER — Encounter: Admitting: Physical Therapy

## 2023-07-23 ENCOUNTER — Encounter: Payer: Self-pay | Admitting: Family Medicine

## 2023-07-23 DIAGNOSIS — R42 Dizziness and giddiness: Secondary | ICD-10-CM

## 2023-07-23 DIAGNOSIS — M1990 Unspecified osteoarthritis, unspecified site: Secondary | ICD-10-CM

## 2023-07-23 MED ORDER — MECLIZINE HCL 25 MG PO TABS: 25.0000 mg | ORAL_TABLET | Freq: Three times a day (TID) | ORAL | 1 refills | Status: AC | PRN

## 2023-07-24 ENCOUNTER — Ambulatory Visit (INDEPENDENT_AMBULATORY_CARE_PROVIDER_SITE_OTHER): Admitting: Orthopaedic Surgery

## 2023-07-24 DIAGNOSIS — M75122 Complete rotator cuff tear or rupture of left shoulder, not specified as traumatic: Secondary | ICD-10-CM

## 2023-07-24 NOTE — Progress Notes (Signed)
 Post Operative Evaluation    Procedure/Date of Surgery: Left shoulder reverse shoulder arthroplasty 5/6  Interval History:    Presents today 6 weeks status post the above procedure overall doing well.  Overall she is doing very well.   PMH/PSH/Family History/Social History/Meds/Allergies:    Past Medical History:  Diagnosis Date   Allergy     Asthma    Degenerative disc disease, lumbar    GERD (gastroesophageal reflux disease)    Hypertension    Hypothyroidism    Thyroid  disease    hypothyroidism   Ulcer    Past Surgical History:  Procedure Laterality Date   ABDOMINAL HYSTERECTOMY     DILATION AND CURETTAGE OF UTERUS     REVERSE SHOULDER ARTHROPLASTY Left 06/11/2023   Procedure: ARTHROPLASTY, SHOULDER, TOTAL, REVERSE;  Surgeon: Wilhelmenia Harada, MD;  Location: [facility];  Service: Orthopedics;  Laterality: Left;   ROTATOR CUFF REPAIR  11/30/2015   Right shoulder   Social History   Socioeconomic History   Marital status: Married    Spouse name: Royston Cornea   Number of children: 5   Years of education: 12   Highest education level: 12th grade  Occupational History    Employer: retired  Tobacco Use   Smoking status: Never    Passive exposure: Current (husband smokes 50+ years)   Smokeless tobacco: Never  Vaping Use   Vaping status: Never Used  Substance and Sexual Activity   Alcohol use: No   Drug use: No   Sexual activity: Not Currently    Birth control/protection: Surgical    Comment: hyst  Other Topics Concern   Not on file  Social History Narrative   Lives with husband. Has 3 daughters and 2 step-daughters - all live within an hour away   Social Drivers of Health   Financial Resource Strain: Low Risk  (07/08/2023)   Overall Financial Resource Strain (CARDIA)    Difficulty of Paying Living Expenses: Not hard at all  Food Insecurity: Low Risk  (06/04/2023)   Received from [facility]   Hunger Vital Sign     Within the past 12 months, you worried that your food would run out before you got money to buy more: Never true    Within the past 12 months, the food you bought just didn't last and you didn't have money to get more. : Never true  Transportation Needs: No Transportation Needs (06/04/2023)   Received from [facility]    In the past 12 months, has lack of reliable transportation kept you from medical appointments, meetings, work or from getting things needed for daily living? : No  Physical Activity: Insufficiently Active (07/08/2023)   Exercise Vital Sign    Days of Exercise per Week: 7 days    Minutes of Exercise per Session: 20 min  Stress: No Stress Concern Present (07/08/2023)   [employer] of Occupational Health - Occupational Stress Questionnaire    Feeling of Stress : Not at all  Social Connections: Unknown (02/03/2023)   Social Connection and Isolation Panel    Frequency of Communication with Friends and Family: Patient declined    Frequency of Social Gatherings with Friends and Family: Patient declined    Attends Religious Services: Patient declined    Active Member of Clubs or Organizations: Patient declined  Attends Engineer, structural: More than 4 times per year    Marital Status: Patient declined   Family History  Problem Relation Age of Onset   Arthritis Mother    Heart attack Father        17s MI   Heart disease Father    Allergic rhinitis Sister    Asthma Sister    Allergic rhinitis Sister    Allergic rhinitis Brother    Heart disease Brother    Cancer Brother        liver   Asthma Maternal Grandmother    Heart attack Maternal Grandmother    Breast cancer Maternal Grandmother    Healthy Daughter    Healthy Daughter    Healthy Daughter    Healthy Daughter    Healthy Daughter    Food Allergy  Niece    Thyroid  cancer Neg Hx    Colon cancer Neg Hx    Prostate cancer Neg Hx    Ovarian cancer Neg Hx    Angioedema Neg Hx     Eczema Neg Hx    Atopy Neg Hx    Immunodeficiency Neg Hx    Allergies  Allergen Reactions   Anesthesia S-I-40 [Propofol ] Nausea And Vomiting    Per patient need to be careful when giving this   Current Outpatient Medications  Medication Sig Dispense Refill   albuterol  (VENTOLIN  HFA) 108 (90 Base) MCG/ACT inhaler Inhale 2 puffs into the lungs every 4 (four) hours as needed for wheezing or shortness of breath (coughing fits). 18 g 1   Azelastine HCl 137 MCG/SPRAY SOLN Place into the nose.     budesonide -formoterol  (SYMBICORT ) 160-4.5 MCG/ACT inhaler Inhale 2 puffs into the lungs 2 (two) times daily.     celecoxib  (CELEBREX ) 100 MG capsule TAKE 1 CAPSULE BY MOUTH TWICE A DAY 60 capsule 0   Cholecalciferol (VITAMIN D-3 PO) Take by mouth.     dicyclomine (BENTYL) 20 MG tablet Take 20 mg by mouth 3 (three) times daily.     doxepin (SINEQUAN) 10 MG capsule Take 10 mg by mouth at bedtime as needed.     fluticasone  (FLONASE ) 50 MCG/ACT nasal spray Place 2 sprays into both nostrils daily. 16 g 6   hydrOXYzine  (ATARAX ) 10 MG tablet Take 10 mg by mouth 3 (three) times daily as needed for itching.     levothyroxine  (EUTHYROX ) 75 MCG tablet Take 1 tablet (75 mcg total) by mouth daily before breakfast. 90 tablet 1   losartan -hydrochlorothiazide  (HYZAAR) 100-25 MG tablet Take 1 tablet by mouth daily. 90 tablet 3   meclizine  (ANTIVERT ) 25 MG tablet Take 1 tablet (25 mg total) by mouth 3 (three) times daily as needed for dizziness. 30 tablet 1   methocarbamol  (ROBAXIN ) 500 MG tablet Take 1 tablet (500 mg total) by mouth 4 (four) times daily. 40 tablet 0   montelukast  (SINGULAIR ) 10 MG tablet Take 1 tablet (10 mg total) by mouth at bedtime. 90 tablet 3   omeprazole  (PRILOSEC) 40 MG capsule Take 1 capsule (40 mg total) by mouth daily. 90 capsule 1   oxyCODONE  (ROXICODONE ) 5 MG immediate release tablet Take 1 tablet (5 mg total) by mouth every 4 (four) hours as needed for severe pain (pain score 7-10) or  breakthrough pain. 30 tablet 0   rosuvastatin  (CRESTOR ) 10 MG tablet Take 1 tablet (10 mg total) by mouth at bedtime. For cholesterol 90 tablet 3   Spacer/Aero-Holding Chambers (AEROCHAMBER PLUS) inhaler Use as instructed 1 each 2  No current facility-administered medications for this visit.   No results found.  Review of Systems:   A ROS was performed including pertinent positives and negatives as documented in the HPI.   Musculoskeletal Exam:    There were no vitals taken for this visit.  Left shoulder incisions well-appearing with some bruising without erythema or drainage.  In the spine position she can forward elevate to 90 degrees with external rotation at side to 30 degrees.  Distal neurosensory exam is intact with 2+ radial pulse  Imaging:    3 views left shoulder: Status post reverse shoulder arthroplasty without evidence of complication  I personally reviewed and interpreted the radiographs.   Assessment:   73 year old female who is 6 weeks status post left reverse shoulder arthroplasty without evidence of complication.  Overall doing extremely well.  Range of motion is improving nicely.  I will plan to see her back in 6 weeks.  I do believe she would benefit from ongoing continued physical therapy as she does have some weakness with overhead elevation and limited range of motion Plan :    - Return to clinic 6 weeks for reassessment      I personally saw and evaluated the patient, and participated in the management and treatment plan.  Wilhelmenia Harada, MD Attending Physician, Orthopedic Surgery  This document was dictated using Dragon voice recognition software. A reasonable attempt at proof reading has been made to minimize errors.

## 2023-07-25 ENCOUNTER — Ambulatory Visit

## 2023-07-25 DIAGNOSIS — M25512 Pain in left shoulder: Secondary | ICD-10-CM | POA: Diagnosis not present

## 2023-07-25 DIAGNOSIS — M25612 Stiffness of left shoulder, not elsewhere classified: Secondary | ICD-10-CM | POA: Diagnosis not present

## 2023-07-25 NOTE — Therapy (Signed)
 OUTPATIENT PHYSICAL THERAPY SHOULDER TREATMENT   Patient Name: Diane Grant MRN: [id number] DOB:01/12/1951, 73 y.o., female Today's Date: 07/25/2023  END OF SESSION:  PT End of Session - 07/25/23 1020     Visit Number 11    Number of Visits 12    Date for PT Re-Evaluation 08/30/23    PT Start Time 1017    PT Stop Time 1110    PT Time Calculation (min) 53 min    Activity Tolerance Patient tolerated treatment well    Behavior During Therapy WFL for tasks assessed/performed              Past Medical History:  Diagnosis Date   Allergy     Asthma    Degenerative disc disease, lumbar    GERD (gastroesophageal reflux disease)    Hypertension    Hypothyroidism    Thyroid  disease    hypothyroidism   Ulcer    Past Surgical History:  Procedure Laterality Date   ABDOMINAL HYSTERECTOMY     DILATION AND CURETTAGE OF UTERUS     REVERSE SHOULDER ARTHROPLASTY Left 06/11/2023   Procedure: ARTHROPLASTY, SHOULDER, TOTAL, REVERSE;  Surgeon: Wilhelmenia Harada, MD;  Location: [facility];  Service: Orthopedics;  Laterality: Left;   ROTATOR CUFF REPAIR  11/30/2015   Right shoulder   Patient Active Problem List   Diagnosis Date Noted   Nontraumatic complete tear of left rotator cuff 06/11/2023   Moderate persistent asthma without complication 04/24/2023   Laryngopharyngeal reflux (LPR) 04/24/2023   Allergy  to alpha-gal 04/24/2023   Second hand tobacco smoke exposure 06/15/2021   Other allergic rhinitis 06/15/2021   Other adverse food reactions, not elsewhere classified, subsequent encounter 06/15/2021   Irritable bowel syndrome with constipation 11/16/2020   Cough 03/29/2020   Acute pharyngitis 03/29/2020   Urticaria 11/27/2019   Palpitations 06/16/2018   Chest pain of uncertain etiology 06/16/2018   Shortness of breath 06/16/2018   Educated about COVID-19 virus infection 06/16/2018   Change in voice 03/08/2017   Degenerative disc disease, lumbar  03/08/2017   Hypothyroidism 06/26/2013   Essential hypertension 06/26/2013    PCP: Manette Section, MD  REFERRING PROVIDER: Wilhelmenia Harada, MD   REFERRING DIAG: Nontraumatic complete tear of left rotator cuff   THERAPY DIAG:  Acute pain of left shoulder  Stiffness of left shoulder, not elsewhere classified  Rationale for Evaluation and Treatment: Rehabilitation  ONSET DATE: 06/11/23  SUBJECTIVE:                                                                                                                                                                                      SUBJECTIVE STATEMENT:  Patient reports that her shoulder is little tight, but not hurting. Her surgeon is pleased with how her shoulder is doing. She was not too sore after her last appointment.  Hand dominance: Right  PERTINENT HISTORY: Hypertension, asthma, and allergies  PAIN:  Are you having pain? Yes: NPRS scale: 0/10 Pain location: left shoulder and arm Pain description: constant aching and throbbing Aggravating factors: coughing Relieving factors: ice and medication   PRECAUTIONS: Shoulder  RED FLAGS: None   WEIGHT BEARING RESTRICTIONS: NWB on LUE  FALLS:  Has patient fallen in last 6 months? No  LIVING ENVIRONMENT: Lives with: lives with their family Lives in: House/apartment Has following equipment at home: sling with abduction pillow  OCCUPATION: Retired   PLOF: Independent  PATIENT GOALS:be able garden and working outside, reduced pain, and improved mobility  NEXT MD VISIT: none scheduled  OBJECTIVE:  Note: Objective measures were completed at Evaluation unless otherwise noted.  DIAGNOSTIC FINDINGS: 06/11/23 left shoulder x-ray IMPRESSION: Baseline radiograph status post left reverse shoulder arthroplasty.  PATIENT SURVEYS:  Quick Dash 95.45  COGNITION: Overall cognitive status: Within functional limits for tasks assessed     SENSATION: Patient reports numbness in  her left forearm and hand  UPPER EXTREMITY ROM:   Active ROM Right eval Left Eval (PROM)  Shoulder flexion 126 60  Shoulder extension    Shoulder abduction 126 55  Shoulder adduction    Shoulder internal rotation    Shoulder external rotation 30 5  Elbow flexion    Elbow extension    Wrist flexion    Wrist extension    Wrist ulnar deviation    Wrist radial deviation    Wrist pronation    Wrist supination     07/18/23:  In supine P/AROM left shoulder flexion to 125 degrees.  UPPER EXTREMITY MMT: not tested due to surgical condition  SHOULDER SPECIAL TESTS: Not tested due to surgical condition  PALPATION:  TTP: left upper trapezius, supraspinatus, infraspinatus, deltoid, biceps, and triceps                                                                                                                             TREATMENT DATE:                                   07/25/23  EXERCISE LOG  Exercise Repetitions and Resistance Comments  Pulleys  5 minutes  Flexion  UE ranger (seated)  4.5 minutes  Multidirectional  UE ranger (standing)   2 minutes  Flexion   Resisted row Green t-band x 3 minutes   Resisted pull down  Green t-band x 20 reps    Wall ladder  2 minutes  #25 max   L shoulder ADD   Red t-band x 2 minutes    AA L shoulder ABD  2 minutes   AA cane punch out  2 minutes  Blank cell = exercise not performed today  Modalities: no redness or adverse reaction to today's modalities  Date:  Unattended Estim: left deltoid, pre mod @ 80-150 Hz, 15 mins, Pain and Tone  07/18/23:                                     EXERCISE LOG  Exercise Repetitions and Resistance Comments  Pulleys 5 minutes   UE Ranger (seated) 5 minutes   Wall ladder 4 minutes (23)   Towel on wall 4 minutes       f/b gentle PROM into left shoulder flexion and and ER x 9 minutes per protocol guidelines f/b low-level (non-motoric) IFC at 80-150 Hz on 40% scan x 15 minutes.      07/15/23:  Pulleys  into flexion x 6 minutes f/b seated UE Ranger x 6 minutes f/b gentle PROM into left shoulder flexion and and ER x 13 minutes per protocol guidelines f/b low-level (non-motoric) IFC at 80-150 Hz on 40% scan x 15 minutes.    PATIENT EDUCATION: Education details: HEP, healing, and expectation for soreness Person educated: Patient Education method: Explanation Education comprehension: verbalized understanding  HOME EXERCISE PROGRAM:   ASSESSMENT:  CLINICAL IMPRESSION: Patient was progressed with multiple new interventions for improved function with upper extremity elevation. She required minimal cueing with today's new interventions for proper exercise performance. Muscular fatigue was her primary limitation with today's interventions as she required brief rest breaks throughout treatment. She reported that her shoulder felt tired, but better upon the conclusion of treatment. She continues to require skilled physical therapy to address her remaining impairments to return to her prior level of function.   OBJECTIVE IMPAIRMENTS: decreased activity tolerance, decreased ROM, decreased strength, hypomobility, impaired sensation, impaired tone, impaired UE functional use, and pain.   ACTIVITY LIMITATIONS: carrying, lifting, sleeping, bathing, dressing, reach over head, and hygiene/grooming  PARTICIPATION LIMITATIONS: meal prep, cleaning, laundry, shopping, community activity, and yard work  PERSONAL FACTORS: Age, Past/current experiences, Time since onset of injury/illness/exacerbation, Transportation, and 3+ comorbidities: Hypertension, asthma, and allergies are also affecting patient's functional outcome.   REHAB POTENTIAL: Good  CLINICAL DECISION MAKING: Evolving/moderate complexity  EVALUATION COMPLEXITY: Moderate   GOALS: Goals reviewed with patient? Yes  SHORT TERM GOALS: Target date: 07/05/23  Patient will be independent with her initial HEP.  Baseline: Goal status: MET  2.   Patient will be able to demonstrate at least 90 degrees of left shoulder flexion for improved function reaching.  Baseline: 5/29: AA 98 degrees Goal status: PARTIALLY MET  3.  Patient will be able to demonstrate at least 90 degrees of left shoulder abduction for improved function reaching.  Baseline: 6/5: AA 92 degrees Goal status: PARTIALLY MET  4.  Patient will improve her QuickDASH score to 75 or less for improved perceived function with her daily activities.  Baseline: 6/5: 56.8  Goal status: MET  LONG TERM GOALS: Target date: 07/26/23  Patient will be independent with her advanced HEP.  Baseline:  Goal status: IN PROGRESS  2.  Patient will be able to demonstrate at least 120 degrees of active left shoulder flexion for improved function reaching overhead.  Baseline:  Goal status: IN PROGRESS  3.  Patient will be able to demonstrate at least 120 degrees of active left shoulder abduction for improved function reaching overhead.  Baseline:  Goal status: IN PROGRESS  4.  Patient will be able  to carry at least 5 pounds in her left hand for improved function gardening.  Baseline:  Goal status: IN PROGRESS  5.  Patient will improve her QuickDASH score to 50 or less for improved perceived function with her daily activities.  Baseline:  Goal status: IN PROGRESS  PLAN:  PT FREQUENCY: 2x/week  PT DURATION: 6 weeks  PLANNED INTERVENTIONS: 97164- PT Re-evaluation, 97750- Physical Performance Testing, 97110-Therapeutic exercises, 97530- Therapeutic activity, [id number]- Neuromuscular re-education, 97535- Self Care, 95284- Manual therapy, G0283- Electrical stimulation (unattended), 97016- Vasopneumatic device, Patient/Family education, Joint mobilization, Cryotherapy, and Moist heat  PLAN FOR NEXT SESSION: PROM, manual therapy, pendulums, scapular retraction, and modalities as needed   Lane Pinon, PT 07/25/2023, 12:48 PM

## 2023-07-30 ENCOUNTER — Encounter: Payer: Self-pay | Admitting: *Deleted

## 2023-07-30 ENCOUNTER — Ambulatory Visit: Admitting: *Deleted

## 2023-07-30 DIAGNOSIS — M25512 Pain in left shoulder: Secondary | ICD-10-CM | POA: Diagnosis not present

## 2023-07-30 DIAGNOSIS — M25612 Stiffness of left shoulder, not elsewhere classified: Secondary | ICD-10-CM | POA: Diagnosis not present

## 2023-07-30 NOTE — Therapy (Unsigned)
 OUTPATIENT PHYSICAL THERAPY SHOULDER TREATMENT   Patient Name: Diane Grant MRN: [id number] DOB:02-03-1951, 73 y.o., female Today's Date: 07/30/2023  END OF SESSION:  PT End of Session - 07/30/23 0848     Visit Number 12    Number of Visits 12    Date for PT Re-Evaluation 08/30/23    PT Start Time 0848    PT Stop Time 0948    PT Time Calculation (min) 60 min              Past Medical History:  Diagnosis Date   Allergy     Asthma    Degenerative disc disease, lumbar    GERD (gastroesophageal reflux disease)    Hypertension    Hypothyroidism    Thyroid  disease    hypothyroidism   Ulcer    Past Surgical History:  Procedure Laterality Date   ABDOMINAL HYSTERECTOMY     DILATION AND CURETTAGE OF UTERUS     REVERSE SHOULDER ARTHROPLASTY Left 06/11/2023   Procedure: ARTHROPLASTY, SHOULDER, TOTAL, REVERSE;  Surgeon: Genelle Standing, MD;  Location: [facility];  Service: Orthopedics;  Laterality: Left;   ROTATOR CUFF REPAIR  11/30/2015   Right shoulder   Patient Active Problem List   Diagnosis Date Noted   Nontraumatic complete tear of left rotator cuff 06/11/2023   Moderate persistent asthma without complication 04/24/2023   Laryngopharyngeal reflux (LPR) 04/24/2023   Allergy  to alpha-gal 04/24/2023   Second hand tobacco smoke exposure 06/15/2021   Other allergic rhinitis 06/15/2021   Other adverse food reactions, not elsewhere classified, subsequent encounter 06/15/2021   Irritable bowel syndrome with constipation 11/16/2020   Cough 03/29/2020   Acute pharyngitis 03/29/2020   Urticaria 11/27/2019   Palpitations 06/16/2018   Chest pain of uncertain etiology 06/16/2018   Shortness of breath 06/16/2018   Educated about COVID-19 virus infection 06/16/2018   Change in voice 03/08/2017   Degenerative disc disease, lumbar 03/08/2017   Hypothyroidism 06/26/2013   Essential hypertension 06/26/2013    PCP: Colette Torrence GRADE, MD  REFERRING  PROVIDER: Genelle Standing, MD   REFERRING DIAG: Nontraumatic complete tear of left rotator cuff   THERAPY DIAG:  Acute pain of left shoulder  Stiffness of left shoulder, not elsewhere classified  Rationale for Evaluation and Treatment: Rehabilitation  ONSET DATE: 06/11/23  SUBJECTIVE:                                                                                                                                                                                      SUBJECTIVE STATEMENT: Patient reports that her shoulder is little tight, but not hurting. Her surgeon is pleased with how her shoulder  is doing but wants her to continue PT because I can't raise it high enough  Hand dominance: Right  PERTINENT HISTORY: Hypertension, asthma, and allergies  PAIN:  Are you having pain? Yes: NPRS scale: 0/10 Pain location: left shoulder and arm Pain description: constant aching and throbbing Aggravating factors: coughing Relieving factors: ice and medication   PRECAUTIONS: Shoulder  RED FLAGS: None   WEIGHT BEARING RESTRICTIONS: NWB on LUE  FALLS:  Has patient fallen in last 6 months? No  LIVING ENVIRONMENT: Lives with: lives with their family Lives in: House/apartment Has following equipment at home: sling with abduction pillow  OCCUPATION: Retired   PLOF: Independent  PATIENT GOALS:be able garden and working outside, reduced pain, and improved mobility  NEXT MD VISIT: none scheduled  OBJECTIVE:  Note: Objective measures were completed at Evaluation unless otherwise noted.  DIAGNOSTIC FINDINGS: 06/11/23 left shoulder x-ray IMPRESSION: Baseline radiograph status post left reverse shoulder arthroplasty.  PATIENT SURVEYS:  Quick Dash 95.45  COGNITION: Overall cognitive status: Within functional limits for tasks assessed     SENSATION: Patient reports numbness in her left forearm and hand  UPPER EXTREMITY ROM:   Active ROM Right eval Left Eval (PROM)  Shoulder  flexion 126 60  Shoulder extension    Shoulder abduction 126 55  Shoulder adduction    Shoulder internal rotation    Shoulder external rotation 30 5  Elbow flexion    Elbow extension    Wrist flexion    Wrist extension    Wrist ulnar deviation    Wrist radial deviation    Wrist pronation    Wrist supination     07/18/23:  In supine P/AROM left shoulder flexion to 125 degrees.  UPPER EXTREMITY MMT: not tested due to surgical condition  SHOULDER SPECIAL TESTS: Not tested due to surgical condition  PALPATION:  TTP: left upper trapezius, supraspinatus, infraspinatus, deltoid, biceps, and triceps                                                                                                                             TREATMENT DATE:                                   07/30/23  EXERCISE LOG   7weeks 07-30-23  Exercise Repetitions and Resistance Comments  Pulleys  5 minutes  Flexion  UE ranger (seated)  4.5 minutes  Multidirectional  UE ranger (standing)    Flexion   Resisted row Green t-band 2x10   Resisted pull down  Green t-band x 20 reps    Wall ladder  X 10 with pulling hand off the ladder for isometric hold at top #25 max   L shoulder ADD   Red t-band x 2 minutes    AA L shoulder ABD   and flexion 2 minutes each way with focus on eccentrics   AA cane punch out  [facility] in/out of cabinet 1st shelf x10    Blank cell = exercise not performed today  Modalities: no redness or adverse reaction to today's modalities Manual AAROM for flexion Date:  Unattended Estim: left deltoid, pre mod @ 80-150 Hz, 15 mins, Pain and Tone Reviewed and progressed HEP incase  Pt  is DC 07/18/23:                                     EXERCISE LOG  Exercise Repetitions and Resistance Comments  Pulleys 5 minutes   UE Ranger (seated) 5 minutes   Wall ladder 4 minutes (23)   Towel on wall 4 minutes       f/b gentle PROM into left shoulder flexion and and ER x 9 minutes per protocol guidelines f/b  low-level (non-motoric) IFC at 80-150 Hz on 40% scan x 15 minutes.      07/15/23:  Pulleys into flexion x 6 minutes f/b seated UE Ranger x 6 minutes f/b gentle PROM into left shoulder flexion and and ER x 13 minutes per protocol guidelines f/b low-level (non-motoric) IFC at 80-150 Hz on 40% scan x 15 minutes.    PATIENT EDUCATION: Education details: HEP, healing, and expectation for soreness Person educated: Patient Education method: Explanation Education comprehension: verbalized understanding  HOME EXERCISE PROGRAM:   ASSESSMENT:  CLINICAL IMPRESSION: Patient arrived today doing fairly well and was able to continue with UE AAROM, AROM as well as mm activation exs. She has met majority of STG's and is progressing towards LTG's. Unable to meet LTG's due to AROM and strength deficits.  07/30/23 PROGRESS REPORT:  Patient is making fair progress with skilled physical therapy as evidenced by her improved left shoulder AROM, QuickDASH score, functional mobility and progress toward her goals. She was able to meet her short term goals for improved left shoulder flexion and QuickDASH score. However, she was unable to meet any of her long term physical therapy goals at this time. Recommend that she continue with skilled physical therapy for eight additional visits at twice per week to address her remaining impairments to return to her prior level of function.   Lacinda Fass, PT, DPT   OBJECTIVE IMPAIRMENTS: decreased activity tolerance, decreased ROM, decreased strength, hypomobility, impaired sensation, impaired tone, impaired UE functional use, and pain.   ACTIVITY LIMITATIONS: carrying, lifting, sleeping, bathing, dressing, reach over head, and hygiene/grooming  PARTICIPATION LIMITATIONS: meal prep, cleaning, laundry, shopping, community activity, and yard work  PERSONAL FACTORS: Age, Past/current experiences, Time since onset of injury/illness/exacerbation, Transportation, and 3+  comorbidities: Hypertension, asthma, and allergies are also affecting patient's functional outcome.   REHAB POTENTIAL: Good  CLINICAL DECISION MAKING: Evolving/moderate complexity  EVALUATION COMPLEXITY: Moderate   GOALS: Goals reviewed with patient? Yes  SHORT TERM GOALS: Target date: 07/05/23  Patient will be independent with her initial HEP.  Baseline: Goal status: MET  2.  Patient will be able to demonstrate at least 90 degrees of left shoulder flexion for improved function reaching.  Baseline: 5/29: AA   120degrees     AROM  90 degrees Goal status:  MET  3.  Patient will be able to demonstrate at least 90 degrees of left shoulder abduction for improved function reaching.  Baseline: 6/5: AA 92 degrees       80 degrees Goal status: PARTIALLY MET  4.  Patient will improve her QuickDASH score to 75 or less for improved  perceived function with her daily activities.  Baseline: 6/5: 56.8  Goal status: MET  LONG TERM GOALS: Target date: 07/26/23  Patient will be independent with her advanced HEP.  Baseline:  Goal status: IN PROGRESS  2.  Patient will be able to demonstrate at least 120 degrees of active left shoulder flexion for improved function reaching overhead.  Baseline:  Goal status: IN PROGRESS   90 degrees  3.  Patient will be able to demonstrate at least 120 degrees of active left shoulder abduction for improved function reaching overhead.  Baseline:  Goal status: IN PROGRESS   80 degrees  4.  Patient will be able to carry at least 5 pounds in her left hand for improved function gardening.  Baseline:  Goal status: in Progress  5.  Patient will improve her QuickDASH score to 50 or less for improved perceived function with her daily activities.  Baseline:  Goal status: IN PROGRESS    59.09  PLAN:  PT FREQUENCY: 2x/week  PT DURATION: 6 weeks  PLANNED INTERVENTIONS: 97164- PT Re-evaluation, 97750- Physical Performance Testing, 97110-Therapeutic exercises,  97530- Therapeutic activity, 97112- Neuromuscular re-education, 97535- Self Care, 02859- Manual therapy, G0283- Electrical stimulation (unattended), 97016- Vasopneumatic device, Patient/Family education, Joint mobilization, Cryotherapy, and Moist heat  PLAN : Recert to continue PT and wait for insurance approval.   Kapil Petropoulos,CHRIS, PTA 07/30/2023, 10:13 AM

## 2023-08-05 ENCOUNTER — Ambulatory Visit

## 2023-08-05 DIAGNOSIS — M25612 Stiffness of left shoulder, not elsewhere classified: Secondary | ICD-10-CM | POA: Diagnosis not present

## 2023-08-05 DIAGNOSIS — M25512 Pain in left shoulder: Secondary | ICD-10-CM | POA: Diagnosis not present

## 2023-08-05 NOTE — Therapy (Signed)
 OUTPATIENT PHYSICAL THERAPY SHOULDER TREATMENT   Patient Name: Diane Grant MRN: [id number] DOB:02-27-1950, 73 y.o., female Today's Date: 08/05/2023  END OF SESSION:  PT End of Session - 08/05/23 1345     Visit Number 13    Number of Visits 20    Date for PT Re-Evaluation 08/30/23    PT Start Time 1345    PT Stop Time 1426    PT Time Calculation (min) 41 min    Activity Tolerance Patient tolerated treatment well    Behavior During Therapy WFL for tasks assessed/performed               Past Medical History:  Diagnosis Date   Allergy     Asthma    Degenerative disc disease, lumbar    GERD (gastroesophageal reflux disease)    Hypertension    Hypothyroidism    Thyroid  disease    hypothyroidism   Ulcer    Past Surgical History:  Procedure Laterality Date   ABDOMINAL HYSTERECTOMY     DILATION AND CURETTAGE OF UTERUS     REVERSE SHOULDER ARTHROPLASTY Left 06/11/2023   Procedure: ARTHROPLASTY, SHOULDER, TOTAL, REVERSE;  Surgeon: Genelle Standing, MD;  Location: [facility];  Service: Orthopedics;  Laterality: Left;   ROTATOR CUFF REPAIR  11/30/2015   Right shoulder   Patient Active Problem List   Diagnosis Date Noted   Nontraumatic complete tear of left rotator cuff 06/11/2023   Moderate persistent asthma without complication 04/24/2023   Laryngopharyngeal reflux (LPR) 04/24/2023   Allergy  to alpha-gal 04/24/2023   Second hand tobacco smoke exposure 06/15/2021   Other allergic rhinitis 06/15/2021   Other adverse food reactions, not elsewhere classified, subsequent encounter 06/15/2021   Irritable bowel syndrome with constipation 11/16/2020   Cough 03/29/2020   Acute pharyngitis 03/29/2020   Urticaria 11/27/2019   Palpitations 06/16/2018   Chest pain of uncertain etiology 06/16/2018   Shortness of breath 06/16/2018   Educated about COVID-19 virus infection 06/16/2018   Change in voice 03/08/2017   Degenerative disc disease, lumbar  03/08/2017   Hypothyroidism 06/26/2013   Essential hypertension 06/26/2013    PCP: Colette Torrence GRADE, MD  REFERRING PROVIDER: Genelle Standing, MD   REFERRING DIAG: Nontraumatic complete tear of left rotator cuff   THERAPY DIAG:  Acute pain of left shoulder  Stiffness of left shoulder, not elsewhere classified  Rationale for Evaluation and Treatment: Rehabilitation  ONSET DATE: 06/11/23  SUBJECTIVE:                                                                                                                                                                                      SUBJECTIVE  STATEMENT: She feels that she is doing better. However, it began bothering her again over the weekend with no known cause.  Hand dominance: Right  PERTINENT HISTORY: Hypertension, asthma, and allergies  PAIN:  Are you having pain? Yes: NPRS scale: 2-3/10 Pain location: left shoulder and arm Pain description: constant aching and throbbing Aggravating factors: coughing Relieving factors: ice and medication   PRECAUTIONS: Shoulder  RED FLAGS: None   WEIGHT BEARING RESTRICTIONS: NWB on LUE  FALLS:  Has patient fallen in last 6 months? No  LIVING ENVIRONMENT: Lives with: lives with their family Lives in: House/apartment Has following equipment at home: sling with abduction pillow  OCCUPATION: Retired   PLOF: Independent  PATIENT GOALS:be able garden and working outside, reduced pain, and improved mobility  NEXT MD VISIT: none scheduled  OBJECTIVE:  Note: Objective measures were completed at Evaluation unless otherwise noted.  DIAGNOSTIC FINDINGS: 06/11/23 left shoulder x-ray IMPRESSION: Baseline radiograph status post left reverse shoulder arthroplasty.  PATIENT SURVEYS:  Quick Dash 95.45  COGNITION: Overall cognitive status: Within functional limits for tasks assessed     SENSATION: Patient reports numbness in her left forearm and hand  UPPER EXTREMITY ROM:    Active ROM Right eval Left Eval (PROM)  Shoulder flexion 126 60  Shoulder extension    Shoulder abduction 126 55  Shoulder adduction    Shoulder internal rotation    Shoulder external rotation 30 5  Elbow flexion    Elbow extension    Wrist flexion    Wrist extension    Wrist ulnar deviation    Wrist radial deviation    Wrist pronation    Wrist supination     07/18/23:  In supine P/AROM left shoulder flexion to 125 degrees.  UPPER EXTREMITY MMT: not tested due to surgical condition  SHOULDER SPECIAL TESTS: Not tested due to surgical condition  PALPATION:  TTP: left upper trapezius, supraspinatus, infraspinatus, deltoid, biceps, and triceps                                                                                                                             TREATMENT DATE:                                    08/05/23 EXERCISE LOG  Exercise Repetitions and Resistance Comments  Pulleys  5.5 minutes   Wall ladder  2 minutes  Max #25  Resisted row Green t-band x 3 minutes    Resisted pull down  Green t-band x 3 minutes   L bicep curl  3# x 2 x 15 reps    L shoulder ER  Yellow t-band x 20 reps    L shoulder IR  Red t-band x 20 reps    Wall ladder with lift off 10 reps    Wall ball  2 x 1 minute  Multidirectional  Ball roll out  3.5 minutes  Flexion   Blank cell = exercise not performed today                                   07/30/23  EXERCISE LOG   7weeks 07-30-23  Exercise Repetitions and Resistance Comments  Pulleys  5 minutes  Flexion  UE ranger (seated)  4.5 minutes  Multidirectional  UE ranger (standing)    Flexion   Resisted row Green t-band 2x10   Resisted pull down  Green t-band x 20 reps    Wall ladder  X 10 with pulling hand off the ladder for isometric hold at top #25 max   L shoulder ADD   Red t-band x 2 minutes    AA L shoulder ABD   and flexion 2 minutes each way with focus on eccentrics   AA cane punch out     [facility] in/out of cabinet 1st shelf  x10    Blank cell = exercise not performed today  Modalities: no redness or adverse reaction to today's modalities Manual AAROM for flexion Date:  Unattended Estim: left deltoid, pre mod @ 80-150 Hz, 15 mins, Pain and Tone Reviewed and progressed HEP incase  Pt  is DC 07/18/23:                                     EXERCISE LOG  Exercise Repetitions and Resistance Comments  Pulleys 5 minutes   UE Ranger (seated) 5 minutes   Wall ladder 4 minutes (23)   Towel on wall 4 minutes       f/b gentle PROM into left shoulder flexion and and ER x 9 minutes per protocol guidelines f/b low-level (non-motoric) IFC at 80-150 Hz on 40% scan x 15 minutes.     PATIENT EDUCATION: Education details: healing Person educated: Patient Education method: Explanation Education comprehension: verbalized understanding  HOME EXERCISE PROGRAM:   ASSESSMENT:  CLINICAL IMPRESSION: Patient was progressed with multiple new and familiar interventions for improved left shoulder functional mobility. She required minimal multimodal cueing with resisted internal and external rotation to prevent trunk rotation. Muscle fatigue was her primary limitation with today's interventions as she required brief rest breaks throughout treatment. She reported that her shoulder felt tired upon the conclusion of treatment. She continues to require skilled physical therapy to address her remaining impairments to maximize her functional mobility.   OBJECTIVE IMPAIRMENTS: decreased activity tolerance, decreased ROM, decreased strength, hypomobility, impaired sensation, impaired tone, impaired UE functional use, and pain.   ACTIVITY LIMITATIONS: carrying, lifting, sleeping, bathing, dressing, reach over head, and hygiene/grooming  PARTICIPATION LIMITATIONS: meal prep, cleaning, laundry, shopping, community activity, and yard work  PERSONAL FACTORS: Age, Past/current experiences, Time since onset of injury/illness/exacerbation,  Transportation, and 3+ comorbidities: Hypertension, asthma, and allergies are also affecting patient's functional outcome.   REHAB POTENTIAL: Good  CLINICAL DECISION MAKING: Evolving/moderate complexity  EVALUATION COMPLEXITY: Moderate   GOALS: Goals reviewed with patient? Yes  SHORT TERM GOALS: Target date: 07/05/23  Patient will be independent with her initial HEP.  Baseline: Goal status: MET  2.  Patient will be able to demonstrate at least 90 degrees of left shoulder flexion for improved function reaching.  Baseline: 5/29: AA   120degrees     AROM  90 degrees Goal status:  MET  3.  Patient will be able to  demonstrate at least 90 degrees of left shoulder abduction for improved function reaching.  Baseline: 6/5: AA 92 degrees       80 degrees Goal status: PARTIALLY MET  4.  Patient will improve her QuickDASH score to 75 or less for improved perceived function with her daily activities.  Baseline: 6/5: 56.8  Goal status: MET  LONG TERM GOALS: Target date: 07/26/23  Patient will be independent with her advanced HEP.  Baseline:  Goal status: IN PROGRESS  2.  Patient will be able to demonstrate at least 120 degrees of active left shoulder flexion for improved function reaching overhead.  Baseline:  Goal status: IN PROGRESS   90 degrees  3.  Patient will be able to demonstrate at least 120 degrees of active left shoulder abduction for improved function reaching overhead.  Baseline:  Goal status: IN PROGRESS   80 degrees  4.  Patient will be able to carry at least 5 pounds in her left hand for improved function gardening.  Baseline:  Goal status: in Progress  5.  Patient will improve her QuickDASH score to 50 or less for improved perceived function with her daily activities.  Baseline:  Goal status: IN PROGRESS    59.09  PLAN:  PT FREQUENCY: 2x/week  PT DURATION: 6 weeks  PLANNED INTERVENTIONS: 97164- PT Re-evaluation, 97750- Physical Performance Testing,  97110-Therapeutic exercises, 97530- Therapeutic activity, 97112- Neuromuscular re-education, 97535- Self Care, 02859- Manual therapy, G0283- Electrical stimulation (unattended), 97016- Vasopneumatic device, Patient/Family education, Joint mobilization, Cryotherapy, and Moist heat  PLAN : Recert to continue PT and wait for insurance approval.   Lacinda JAYSON Fass, PT 08/05/2023, 2:29 PM

## 2023-08-07 ENCOUNTER — Ambulatory Visit: Attending: Orthopaedic Surgery

## 2023-08-07 DIAGNOSIS — M25512 Pain in left shoulder: Secondary | ICD-10-CM | POA: Diagnosis not present

## 2023-08-07 DIAGNOSIS — M25612 Stiffness of left shoulder, not elsewhere classified: Secondary | ICD-10-CM | POA: Diagnosis not present

## 2023-08-07 NOTE — Therapy (Signed)
 OUTPATIENT PHYSICAL THERAPY SHOULDER TREATMENT   Patient Name: Diane Grant MRN: [id number] DOB:1950-12-11, 73 y.o., female Today's Date: 08/07/2023  END OF SESSION:  PT End of Session - 08/07/23 1520     Visit Number 14    Number of Visits 20    Date for PT Re-Evaluation 08/30/23    PT Start Time 1515    PT Stop Time 1604    PT Time Calculation (min) 49 min    Activity Tolerance Patient tolerated treatment well    Behavior During Therapy WFL for tasks assessed/performed               Past Medical History:  Diagnosis Date   Allergy     Asthma    Degenerative disc disease, lumbar    GERD (gastroesophageal reflux disease)    Hypertension    Hypothyroidism    Thyroid  disease    hypothyroidism   Ulcer    Past Surgical History:  Procedure Laterality Date   ABDOMINAL HYSTERECTOMY     DILATION AND CURETTAGE OF UTERUS     REVERSE SHOULDER ARTHROPLASTY Left 06/11/2023   Procedure: ARTHROPLASTY, SHOULDER, TOTAL, REVERSE;  Surgeon: Genelle Standing, MD;  Location: [facility];  Service: Orthopedics;  Laterality: Left;   ROTATOR CUFF REPAIR  11/30/2015   Right shoulder   Patient Active Problem List   Diagnosis Date Noted   Nontraumatic complete tear of left rotator cuff 06/11/2023   Moderate persistent asthma without complication 04/24/2023   Laryngopharyngeal reflux (LPR) 04/24/2023   Allergy  to alpha-gal 04/24/2023   Second hand tobacco smoke exposure 06/15/2021   Other allergic rhinitis 06/15/2021   Other adverse food reactions, not elsewhere classified, subsequent encounter 06/15/2021   Irritable bowel syndrome with constipation 11/16/2020   Cough 03/29/2020   Acute pharyngitis 03/29/2020   Urticaria 11/27/2019   Palpitations 06/16/2018   Chest pain of uncertain etiology 06/16/2018   Shortness of breath 06/16/2018   Educated about COVID-19 virus infection 06/16/2018   Change in voice 03/08/2017   Degenerative disc disease, lumbar  03/08/2017   Hypothyroidism 06/26/2013   Essential hypertension 06/26/2013    PCP: Colette Torrence GRADE, MD  REFERRING PROVIDER: Genelle Standing, MD   REFERRING DIAG: Nontraumatic complete tear of left rotator cuff   THERAPY DIAG:  Acute pain of left shoulder  Stiffness of left shoulder, not elsewhere classified  Rationale for Evaluation and Treatment: Rehabilitation  ONSET DATE: 06/11/23  SUBJECTIVE:                                                                                                                                                                                      SUBJECTIVE  STATEMENT: She feels that she is doing better. However, it began bothering her again over the weekend with no known cause.  Hand dominance: Right  PERTINENT HISTORY: Hypertension, asthma, and allergies  PAIN:  Are you having pain? Yes: NPRS scale: 2-3/10 Pain location: left shoulder and arm Pain description: constant aching and throbbing Aggravating factors: coughing Relieving factors: ice and medication   PRECAUTIONS: Shoulder  RED FLAGS: None   WEIGHT BEARING RESTRICTIONS: NWB on LUE  FALLS:  Has patient fallen in last 6 months? No  LIVING ENVIRONMENT: Lives with: lives with their family Lives in: House/apartment Has following equipment at home: sling with abduction pillow  OCCUPATION: Retired   PLOF: Independent  PATIENT GOALS:be able garden and working outside, reduced pain, and improved mobility  NEXT MD VISIT: none scheduled  OBJECTIVE:  Note: Objective measures were completed at Evaluation unless otherwise noted.  DIAGNOSTIC FINDINGS: 06/11/23 left shoulder x-ray IMPRESSION: Baseline radiograph status post left reverse shoulder arthroplasty.  PATIENT SURVEYS:  Quick Dash 95.45  COGNITION: Overall cognitive status: Within functional limits for tasks assessed     SENSATION: Patient reports numbness in her left forearm and hand  UPPER EXTREMITY ROM:    Active ROM Right eval Left Eval (PROM)  Shoulder flexion 126 60  Shoulder extension    Shoulder abduction 126 55  Shoulder adduction    Shoulder internal rotation    Shoulder external rotation 30 5  Elbow flexion    Elbow extension    Wrist flexion    Wrist extension    Wrist ulnar deviation    Wrist radial deviation    Wrist pronation    Wrist supination     07/18/23:  In supine P/AROM left shoulder flexion to 125 degrees.  UPPER EXTREMITY MMT: not tested due to surgical condition  SHOULDER SPECIAL TESTS: Not tested due to surgical condition  PALPATION:  TTP: left upper trapezius, supraspinatus, infraspinatus, deltoid, biceps, and triceps                                                                                                                             TREATMENT DATE:                                    08/05/23 EXERCISE LOG  Exercise Repetitions and Resistance Comments  Pulleys  6 minutes   Wall ladder  2 minutes  Max #25  Resisted row Green t-band x 3 minutes    Resisted pull down  Green t-band x 3 minutes   L bicep curl  3# x 2 x 15 reps    L shoulder ER     L shoulder IR     Wall ladder with lift off 10 reps    Wall ball   Multidirectional  Ball roll out  4 minutes  Flexion   Blank cell = exercise not performed today  Manual Therapy Soft Tissue Mobilization: left shoulder, STW/M to short head of left bicep and left upper trap to decrease pain and tone.   Modalities  Date:  Unattended Estim: Shoulder, IFC 80-150 Hz, 15 mins, Pain and Tone                                    07/30/23  EXERCISE LOG   7weeks 07-30-23  Exercise Repetitions and Resistance Comments  Pulleys  5 minutes  Flexion  UE ranger (seated)  4.5 minutes  Multidirectional  UE ranger (standing)    Flexion   Resisted row Green t-band 2x10   Resisted pull down  Green t-band x 20 reps    Wall ladder  X 10 with pulling hand off the ladder for isometric hold at top #25 max   L  shoulder ADD   Red t-band x 2 minutes    AA L shoulder ABD   and flexion 2 minutes each way with focus on eccentrics   AA cane punch out     [facility] in/out of cabinet 1st shelf x10    Blank cell = exercise not performed today  Modalities: no redness or adverse reaction to today's modalities Manual AAROM for flexion Date:  Unattended Estim: left deltoid, pre mod @ 80-150 Hz, 15 mins, Pain and Tone Reviewed and progressed HEP incase  Pt  is DC 07/18/23:                                     EXERCISE LOG  Exercise Repetitions and Resistance Comments  Pulleys 5 minutes   UE Ranger (seated) 5 minutes   Wall ladder 4 minutes (23)   Towel on wall 4 minutes       f/b gentle PROM into left shoulder flexion and and ER x 9 minutes per protocol guidelines f/b low-level (non-motoric) IFC at 80-150 Hz on 40% scan x 15 minutes.     PATIENT EDUCATION: Education details: healing Person educated: Patient Education method: Explanation Education comprehension: verbalized understanding  HOME EXERCISE PROGRAM:   ASSESSMENT:  CLINICAL IMPRESSION: Pt arrives for today's treatment session reporting 2-3/10 constant anterior left shoulder pain.  Pain located along short head of left bicep and can be attributed to increased activity.  Pt able to tolerate increased time with various exercise today.  STW/M performed to short head of left bicep and left upper trap to decrease pain and tone.  Normal responses to estim noted upon removal.  Pt reported decreased pain and soreness at completion of today's treatment session.   OBJECTIVE IMPAIRMENTS: decreased activity tolerance, decreased ROM, decreased strength, hypomobility, impaired sensation, impaired tone, impaired UE functional use, and pain.   ACTIVITY LIMITATIONS: carrying, lifting, sleeping, bathing, dressing, reach over head, and hygiene/grooming  PARTICIPATION LIMITATIONS: meal prep, cleaning, laundry, shopping, community activity, and yard work  PERSONAL  FACTORS: Age, Past/current experiences, Time since onset of injury/illness/exacerbation, Transportation, and 3+ comorbidities: Hypertension, asthma, and allergies are also affecting patient's functional outcome.   REHAB POTENTIAL: Good  CLINICAL DECISION MAKING: Evolving/moderate complexity  EVALUATION COMPLEXITY: Moderate   GOALS: Goals reviewed with patient? Yes  SHORT TERM GOALS: Target date: 07/05/23  Patient will be independent with her initial HEP.  Baseline: Goal status: MET  2.  Patient will be able to demonstrate at least 90 degrees of left shoulder flexion  for improved function reaching.  Baseline: 5/29: AA   120degrees     AROM  90 degrees Goal status:  MET  3.  Patient will be able to demonstrate at least 90 degrees of left shoulder abduction for improved function reaching.  Baseline: 6/5: AA 92 degrees       80 degrees Goal status: PARTIALLY MET  4.  Patient will improve her QuickDASH score to 75 or less for improved perceived function with her daily activities.  Baseline: 6/5: 56.8  Goal status: MET  LONG TERM GOALS: Target date: 07/26/23  Patient will be independent with her advanced HEP.  Baseline:  Goal status: IN PROGRESS  2.  Patient will be able to demonstrate at least 120 degrees of active left shoulder flexion for improved function reaching overhead.  Baseline:  Goal status: IN PROGRESS   90 degrees  3.  Patient will be able to demonstrate at least 120 degrees of active left shoulder abduction for improved function reaching overhead.  Baseline:  Goal status: IN PROGRESS   80 degrees  4.  Patient will be able to carry at least 5 pounds in her left hand for improved function gardening.  Baseline:  Goal status: in Progress  5.  Patient will improve her QuickDASH score to 50 or less for improved perceived function with her daily activities.  Baseline:  Goal status: IN PROGRESS    59.09  PLAN:  PT FREQUENCY: 2x/week  PT DURATION: 6  weeks  PLANNED INTERVENTIONS: 97164- PT Re-evaluation, 97750- Physical Performance Testing, 97110-Therapeutic exercises, 97530- Therapeutic activity, [id number]- Neuromuscular re-education, 97535- Self Care, 02859- Manual therapy, G0283- Electrical stimulation (unattended), 97016- Vasopneumatic device, Patient/Family education, Joint mobilization, Cryotherapy, and Moist heat  PLAN :    Delon DELENA Gosling, PTA 08/07/2023, 4:06 PM

## 2023-08-12 ENCOUNTER — Encounter: Payer: Self-pay | Admitting: *Deleted

## 2023-08-12 ENCOUNTER — Ambulatory Visit: Admitting: *Deleted

## 2023-08-12 DIAGNOSIS — M25512 Pain in left shoulder: Secondary | ICD-10-CM

## 2023-08-12 DIAGNOSIS — M25612 Stiffness of left shoulder, not elsewhere classified: Secondary | ICD-10-CM

## 2023-08-12 NOTE — Therapy (Signed)
 OUTPATIENT PHYSICAL THERAPY SHOULDER TREATMENT   Patient Name: Diane Grant MRN: [id number] DOB:05-16-1950, 73 y.o., female Today's Date: 08/12/2023  END OF SESSION:  PT End of Session - 08/12/23 0940     Visit Number 15    Number of Visits 20    Date for PT Re-Evaluation 08/30/23    PT Start Time 0930    PT Stop Time 1030    PT Time Calculation (min) 60 min               Past Medical History:  Diagnosis Date   Allergy     Asthma    Degenerative disc disease, lumbar    GERD (gastroesophageal reflux disease)    Hypertension    Hypothyroidism    Thyroid  disease    hypothyroidism   Ulcer    Past Surgical History:  Procedure Laterality Date   ABDOMINAL HYSTERECTOMY     DILATION AND CURETTAGE OF UTERUS     REVERSE SHOULDER ARTHROPLASTY Left 06/11/2023   Procedure: ARTHROPLASTY, SHOULDER, TOTAL, REVERSE;  Surgeon: Genelle Standing, MD;  Location: [facility];  Service: Orthopedics;  Laterality: Left;   ROTATOR CUFF REPAIR  11/30/2015   Right shoulder   Patient Active Problem List   Diagnosis Date Noted   Nontraumatic complete tear of left rotator cuff 06/11/2023   Moderate persistent asthma without complication 04/24/2023   Laryngopharyngeal reflux (LPR) 04/24/2023   Allergy  to alpha-gal 04/24/2023   Second hand tobacco smoke exposure 06/15/2021   Other allergic rhinitis 06/15/2021   Other adverse food reactions, not elsewhere classified, subsequent encounter 06/15/2021   Irritable bowel syndrome with constipation 11/16/2020   Cough 03/29/2020   Acute pharyngitis 03/29/2020   Urticaria 11/27/2019   Palpitations 06/16/2018   Chest pain of uncertain etiology 06/16/2018   Shortness of breath 06/16/2018   Educated about COVID-19 virus infection 06/16/2018   Change in voice 03/08/2017   Degenerative disc disease, lumbar 03/08/2017   Hypothyroidism 06/26/2013   Essential hypertension 06/26/2013    PCP: Colette Torrence GRADE, MD  REFERRING  PROVIDER: Genelle Standing, MD   REFERRING DIAG: Nontraumatic complete tear of left rotator cuff   THERAPY DIAG:  Acute pain of left shoulder  Stiffness of left shoulder, not elsewhere classified  Rationale for Evaluation and Treatment: Rehabilitation  ONSET DATE: 06/11/23  SUBJECTIVE:                                                                                                                                                                                      SUBJECTIVE STATEMENT: Pt reports that RT shldr has been staying sore and painful.3-4/10 at times for about a week Hand  dominance: Right  PERTINENT HISTORY: Hypertension, asthma, and allergies  PAIN:  Are you having pain? Yes: NPRS scale: 3-4/10 Pain location: left shoulder and arm Pain description: constant aching and throbbing Aggravating factors: coughing Relieving factors: ice and medication   PRECAUTIONS: Shoulder  RED FLAGS: None   WEIGHT BEARING RESTRICTIONS: NWB on LUE  FALLS:  Has patient fallen in last 6 months? No  LIVING ENVIRONMENT: Lives with: lives with their family Lives in: House/apartment Has following equipment at home: sling with abduction pillow  OCCUPATION: Retired   PLOF: Independent  PATIENT GOALS:be able garden and working outside, reduced pain, and improved mobility  NEXT MD VISIT: none scheduled  OBJECTIVE:  Note: Objective measures were completed at Evaluation unless otherwise noted.  DIAGNOSTIC FINDINGS: 06/11/23 left shoulder x-ray IMPRESSION: Baseline radiograph status post left reverse shoulder arthroplasty.  PATIENT SURVEYS:  Quick Dash 95.45  COGNITION: Overall cognitive status: Within functional limits for tasks assessed     SENSATION: Patient reports numbness in her left forearm and hand  UPPER EXTREMITY ROM:   Active ROM Right eval Left Eval (PROM)  Shoulder flexion 126 60  Shoulder extension    Shoulder abduction 126 55  Shoulder adduction     Shoulder internal rotation    Shoulder external rotation 30 5  Elbow flexion    Elbow extension    Wrist flexion    Wrist extension    Wrist ulnar deviation    Wrist radial deviation    Wrist pronation    Wrist supination     07/18/23:  In supine P/AROM left shoulder flexion to 125 degrees.  UPPER EXTREMITY MMT: not tested due to surgical condition  SHOULDER SPECIAL TESTS: Not tested due to surgical condition  PALPATION:  TTP: left upper trapezius, supraspinatus, infraspinatus, deltoid, biceps, and triceps                                                                                                                             TREATMENT DATE:                                    08/12/23 EXERCISE LOG  LT shldr  Exercise Repetitions and Resistance Comments  Pulleys  6 minutes   Seated UE ranger X 4 mins   Wall ladder   Max #25  Resisted row Red t-band x 3 minutes    Resisted pull down  Red t-band x 3 minutes   Punches Red x 10   IR  Red x 10   L bicep curl     L shoulder ER     L shoulder IR     Wall ladder with lift off    Wall ball   Multidirectional  Ball roll out MAT/Wedge 4 minutes  Flexion   Blank cell = exercise not performed today   Manual Therapy Soft Tissue Mobilization: left shoulder, STW/M  to short head of left bicep , Post. Delt, levator, and Utrap to decrease pain and tone.   Modalities  Date:  Unattended Estim: Shoulder, IFC 80-150 Hz, 15 mins, Pain and Tone and  Vaso x 15 mins low                                    07/30/23  EXERCISE LOG   7weeks 07-30-23  Exercise Repetitions and Resistance Comments  Pulleys  5 minutes  Flexion  UE ranger (seated)  4.5 minutes  Multidirectional  UE ranger (standing)    Flexion   Resisted row Green t-band 2x10   Resisted pull down  Green t-band x 20 reps    Wall ladder  X 10 with pulling hand off the ladder for isometric hold at top #25 max   L shoulder ADD   Red t-band x 2 minutes    AA L shoulder ABD   and  flexion 2 minutes each way with focus on eccentrics   AA cane punch out     Cone in/out of cabinet 1st shelf x10    Blank cell = exercise not performed today  Modalities: no redness or adverse reaction to today's modalities Manual AAROM for flexion Date:  Unattended Estim: left deltoid, pre mod @ 80-150 Hz, 15 mins, Pain and Tone Reviewed and progressed HEP incase  Pt  is DC 07/18/23:                                     EXERCISE LOG  Exercise Repetitions and Resistance Comments  Pulleys 5 minutes   UE Ranger (seated) 5 minutes   Wall ladder 4 minutes (23)   Towel on wall 4 minutes       f/b gentle PROM into left shoulder flexion and and ER x 9 minutes per protocol guidelines f/b low-level (non-motoric) IFC at 80-150 Hz on 40% scan x 15 minutes.     PATIENT EDUCATION: Education details: healing Person educated: Patient Education method: Explanation Education comprehension: verbalized understanding  HOME EXERCISE PROGRAM:   ASSESSMENT:  CLINICAL IMPRESSION: Pt arrives for today's treatment session reporting 3-4/10 constant anterior left shoulder pain.  Pain located along short head of left bicep and anterior shldr. Rx focused on scaling back painful exs and modifications with resistance( used Red band today and for HEP)  STW/M  and TPR performed to  left bicep, Levator, and Utrap  with good release and  decrease pain and tone.  Normal responses to estim and vaso noted upon removal.  Pt reported decreased pain and soreness at completion of today's treatment session.   OBJECTIVE IMPAIRMENTS: decreased activity tolerance, decreased ROM, decreased strength, hypomobility, impaired sensation, impaired tone, impaired UE functional use, and pain.   ACTIVITY LIMITATIONS: carrying, lifting, sleeping, bathing, dressing, reach over head, and hygiene/grooming  PARTICIPATION LIMITATIONS: meal prep, cleaning, laundry, shopping, community activity, and yard work  PERSONAL FACTORS: Age,  Past/current experiences, Time since onset of injury/illness/exacerbation, Transportation, and 3+ comorbidities: Hypertension, asthma, and allergies are also affecting patient's functional outcome.   REHAB POTENTIAL: Good  CLINICAL DECISION MAKING: Evolving/moderate complexity  EVALUATION COMPLEXITY: Moderate   GOALS: Goals reviewed with patient? Yes  SHORT TERM GOALS: Target date: 07/05/23  Patient will be independent with her initial HEP.  Baseline: Goal status: MET  2.  Patient will be  able to demonstrate at least 90 degrees of left shoulder flexion for improved function reaching.  Baseline: 5/29: AA   120degrees     AROM  90 degrees Goal status:  MET  3.  Patient will be able to demonstrate at least 90 degrees of left shoulder abduction for improved function reaching.  Baseline: 6/5: AA 92 degrees       80 degrees Goal status: PARTIALLY MET  4.  Patient will improve her QuickDASH score to 75 or less for improved perceived function with her daily activities.  Baseline: 6/5: 56.8  Goal status: MET  LONG TERM GOALS: Target date: 07/26/23  Patient will be independent with her advanced HEP.  Baseline:  Goal status: IN PROGRESS  2.  Patient will be able to demonstrate at least 120 degrees of active left shoulder flexion for improved function reaching overhead.  Baseline:  Goal status: IN PROGRESS   90 degrees  3.  Patient will be able to demonstrate at least 120 degrees of active left shoulder abduction for improved function reaching overhead.  Baseline:  Goal status: IN PROGRESS   80 degrees  4.  Patient will be able to carry at least 5 pounds in her left hand for improved function gardening.  Baseline:  Goal status: in Progress  5.  Patient will improve her QuickDASH score to 50 or less for improved perceived function with her daily activities.  Baseline:  Goal status: IN PROGRESS    59.09  PLAN:  PT FREQUENCY: 2x/week  PT DURATION: 6 weeks  PLANNED  INTERVENTIONS: 97164- PT Re-evaluation, 97750- Physical Performance Testing, 97110-Therapeutic exercises, 97530- Therapeutic activity, [id number]- Neuromuscular re-education, 97535- Self Care, 02859- Manual therapy, [id number]- Electrical stimulation (unattended), 97016- Vasopneumatic device, Patient/Family education, Joint mobilization, Cryotherapy, and Moist heat  PLAN :    Dillinger Aston,CHRIS, PTA 08/12/2023, 10:53 AM

## 2023-08-14 ENCOUNTER — Ambulatory Visit: Admitting: *Deleted

## 2023-08-14 ENCOUNTER — Encounter: Payer: Self-pay | Admitting: *Deleted

## 2023-08-14 DIAGNOSIS — M25512 Pain in left shoulder: Secondary | ICD-10-CM

## 2023-08-14 DIAGNOSIS — M25612 Stiffness of left shoulder, not elsewhere classified: Secondary | ICD-10-CM | POA: Diagnosis not present

## 2023-08-14 NOTE — Therapy (Signed)
 OUTPATIENT PHYSICAL THERAPY SHOULDER TREATMENT   Patient Name: Diane Grant MRN: [id number] DOB:13-Mar-1950, 73 y.o., female Today's Date: 08/14/2023  END OF SESSION:  PT End of Session - 08/14/23 0859     Visit Number 16    Number of Visits 20    Date for PT Re-Evaluation 08/30/23    PT Start Time 0845               Past Medical History:  Diagnosis Date   Allergy     Asthma    Degenerative disc disease, lumbar    GERD (gastroesophageal reflux disease)    Hypertension    Hypothyroidism    Thyroid  disease    hypothyroidism   Ulcer    Past Surgical History:  Procedure Laterality Date   ABDOMINAL HYSTERECTOMY     DILATION AND CURETTAGE OF UTERUS     REVERSE SHOULDER ARTHROPLASTY Left 06/11/2023   Procedure: ARTHROPLASTY, SHOULDER, TOTAL, REVERSE;  Surgeon: Genelle Standing, MD;  Location: [facility];  Service: Orthopedics;  Laterality: Left;   ROTATOR CUFF REPAIR  11/30/2015   Right shoulder   Patient Active Problem List   Diagnosis Date Noted   Nontraumatic complete tear of left rotator cuff 06/11/2023   Moderate persistent asthma without complication 04/24/2023   Laryngopharyngeal reflux (LPR) 04/24/2023   Allergy  to alpha-gal 04/24/2023   Second hand tobacco smoke exposure 06/15/2021   Other allergic rhinitis 06/15/2021   Other adverse food reactions, not elsewhere classified, subsequent encounter 06/15/2021   Irritable bowel syndrome with constipation 11/16/2020   Cough 03/29/2020   Acute pharyngitis 03/29/2020   Urticaria 11/27/2019   Palpitations 06/16/2018   Chest pain of uncertain etiology 06/16/2018   Shortness of breath 06/16/2018   Educated about COVID-19 virus infection 06/16/2018   Change in voice 03/08/2017   Degenerative disc disease, lumbar 03/08/2017   Hypothyroidism 06/26/2013   Essential hypertension 06/26/2013    PCP: Colette Torrence GRADE, MD  REFERRING PROVIDER: Genelle Standing, MD   REFERRING DIAG:  Nontraumatic complete tear of left rotator cuff   THERAPY DIAG:  Acute pain of left shoulder  Stiffness of left shoulder, not elsewhere classified  Rationale for Evaluation and Treatment: Rehabilitation  ONSET DATE: 06/11/23  SUBJECTIVE:                                                                                                                                                                                      SUBJECTIVE STATEMENT: Pt reports that RT shldr has been staying sore  and tight.1/10  Hand dominance: Right  PERTINENT HISTORY: Hypertension, asthma, and allergies  PAIN:  Are you having pain? Yes: NPRS scale: 1/10  Pain location: left shoulder and arm Pain description: constant aching and throbbing Aggravating factors: coughing Relieving factors: ice and medication   PRECAUTIONS: Shoulder  RED FLAGS: None   WEIGHT BEARING RESTRICTIONS: NWB on LUE  FALLS:  Has patient fallen in last 6 months? No  LIVING ENVIRONMENT: Lives with: lives with their family Lives in: House/apartment Has following equipment at home: sling with abduction pillow  OCCUPATION: Retired   PLOF: Independent  PATIENT GOALS:be able garden and working outside, reduced pain, and improved mobility  NEXT MD VISIT: none scheduled  OBJECTIVE:  Note: Objective measures were completed at Evaluation unless otherwise noted.  DIAGNOSTIC FINDINGS: 06/11/23 left shoulder x-ray IMPRESSION: Baseline radiograph status post left reverse shoulder arthroplasty.  PATIENT SURVEYS:  Quick Dash 95.45  COGNITION: Overall cognitive status: Within functional limits for tasks assessed     SENSATION: Patient reports numbness in her left forearm and hand  UPPER EXTREMITY ROM:   Active ROM Right eval Left Eval (PROM)  Shoulder flexion 126 60  Shoulder extension    Shoulder abduction 126 55  Shoulder adduction    Shoulder internal rotation    Shoulder external rotation 30 5  Elbow flexion     Elbow extension    Wrist flexion    Wrist extension    Wrist ulnar deviation    Wrist radial deviation    Wrist pronation    Wrist supination     07/18/23:  In supine P/AROM left shoulder flexion to 125 degrees.  UPPER EXTREMITY MMT: not tested due to surgical condition  SHOULDER SPECIAL TESTS: Not tested due to surgical condition  PALPATION:  TTP: left upper trapezius, supraspinatus, infraspinatus, deltoid, biceps, and triceps                                                                                                                             TREATMENT DATE:                                    08/14/23 EXERCISE LOG  LT shldr  Exercise Repetitions and Resistance Comments  Pulleys  6 minutes   Standing UE ranger 3x10 elevation and CW/CCW    Wall ladder   Max #25  Resisted row Red t-band x 3 minutes    Resisted Ext  Red t-band x 3 minutes   Punches Red x 15   IR  Red x 15   L bicep curl     L shoulder ER     L shoulder IR     Wall ladder with lift off    Wall ball   Multidirectional  Ball roll out MAT/Wedge  5x10 Flexion   Blank cell = exercise not performed today   Manual Therapy Soft Tissue Mobilization: left shoulder, STW/M to short head of left bicep , Post. Delt, levator, and Utrap to decrease pain and tone.   Modalities  Date:  Unattended Estim: Shoulder, IFC 80-150 Hz, 15 mins, Pain and Tone and  Vaso x 15 mins low                                    07/30/23  EXERCISE LOG   7weeks 07-30-23  Exercise Repetitions and Resistance Comments  Pulleys  5 minutes  Flexion  UE ranger (seated)  4.5 minutes  Multidirectional  UE ranger (standing)    Flexion   Resisted row Green t-band 2x10   Resisted pull down  Green t-band x 20 reps    Wall ladder  X 10 with pulling hand off the ladder for isometric hold at top #25 max   L shoulder ADD   Red t-band x 2 minutes    AA L shoulder ABD   and flexion 2 minutes each way with focus on eccentrics   AA cane punch out      [facility] in/out of cabinet 1st shelf x10    Blank cell = exercise not performed today  Modalities: no redness or adverse reaction to today's modalities Manual AAROM for flexion Date:  Unattended Estim: left deltoid, pre mod @ 80-150 Hz, 15 mins, Pain and Tone Reviewed and progressed HEP incase  Pt  is DC 07/18/23:                                     EXERCISE LOG  Exercise Repetitions and Resistance Comments  Pulleys 5 minutes   UE Ranger (seated) 5 minutes   Wall ladder 4 minutes (23)   Towel on wall 4 minutes       f/b gentle PROM into left shoulder flexion and and ER x 9 minutes per protocol guidelines f/b low-level (non-motoric) IFC at 80-150 Hz on 40% scan x 15 minutes.     PATIENT EDUCATION: Education details: healing Person educated: Patient Education method: Explanation Education comprehension: verbalized understanding  HOME EXERCISE PROGRAM:   ASSESSMENT:  CLINICAL IMPRESSION: Pt arrives for today's treatment session reporting 1-2/10  anterior left shoulder pain.   Rx focused on AAROM as well as light strengthening exs avoiding increased pain. She was able to add 1# wt onto 1st cabinet shelf today with no increase in pain.  STW/M  and TPR performed to  left bicep, Levator, and Utrap  with good release and  decrease pain and tone.  Normal responses to estim and vaso noted upon removal.  Pt reported decreased pain and soreness at completion of today's treatment session.   OBJECTIVE IMPAIRMENTS: decreased activity tolerance, decreased ROM, decreased strength, hypomobility, impaired sensation, impaired tone, impaired UE functional use, and pain.   ACTIVITY LIMITATIONS: carrying, lifting, sleeping, bathing, dressing, reach over head, and hygiene/grooming  PARTICIPATION LIMITATIONS: meal prep, cleaning, laundry, shopping, community activity, and yard work  PERSONAL FACTORS: Age, Past/current experiences, Time since onset of injury/illness/exacerbation, Transportation, and 3+  comorbidities: Hypertension, asthma, and allergies are also affecting patient's functional outcome.   REHAB POTENTIAL: Good  CLINICAL DECISION MAKING: Evolving/moderate complexity  EVALUATION COMPLEXITY: Moderate   GOALS: Goals reviewed with patient? Yes  SHORT TERM GOALS: Target date: 07/05/23  Patient will be independent with her initial HEP.  Baseline: Goal status: MET  2.  Patient will be able to demonstrate at least 90 degrees of left shoulder flexion for improved function reaching.  Baseline: 5/29: AA  120degrees     AROM  90 degrees Goal status:  MET  3.  Patient will be able to demonstrate at least 90 degrees of left shoulder abduction for improved function reaching.  Baseline: 6/5: AA 92 degrees       80 degrees Goal status: PARTIALLY MET  4.  Patient will improve her QuickDASH score to 75 or less for improved perceived function with her daily activities.  Baseline: 6/5: 56.8  Goal status: MET  LONG TERM GOALS: Target date: 07/26/23  Patient will be independent with her advanced HEP.  Baseline:  Goal status: IN PROGRESS  2.  Patient will be able to demonstrate at least 120 degrees of active left shoulder flexion for improved function reaching overhead.  Baseline:  Goal status: IN PROGRESS   90 degrees  3.  Patient will be able to demonstrate at least 120 degrees of active left shoulder abduction for improved function reaching overhead.  Baseline:  Goal status: IN PROGRESS   80 degrees  4.  Patient will be able to carry at least 5 pounds in her left hand for improved function gardening.  Baseline:  Goal status: in Progress  5.  Patient will improve her QuickDASH score to 50 or less for improved perceived function with her daily activities.  Baseline:  Goal status: IN PROGRESS    59.09  PLAN:  PT FREQUENCY: 2x/week  PT DURATION: 6 weeks  PLANNED INTERVENTIONS: 97164- PT Re-evaluation, 97750- Physical Performance Testing, 97110-Therapeutic exercises,  97530- Therapeutic activity, [id number]- Neuromuscular re-education, 97535- Self Care, 02859- Manual therapy, G0283- Electrical stimulation (unattended), 97016- Vasopneumatic device, Patient/Family education, Joint mobilization, Cryotherapy, and Moist heat  PLAN :    Yolander Goodie,CHRIS, PTA 08/14/2023, 9:01 AM

## 2023-08-19 ENCOUNTER — Ambulatory Visit

## 2023-08-19 ENCOUNTER — Other Ambulatory Visit: Payer: Self-pay | Admitting: Family Medicine

## 2023-08-19 ENCOUNTER — Telehealth: Payer: Self-pay | Admitting: Orthopaedic Surgery

## 2023-08-19 DIAGNOSIS — M25612 Stiffness of left shoulder, not elsewhere classified: Secondary | ICD-10-CM | POA: Diagnosis not present

## 2023-08-19 DIAGNOSIS — M25512 Pain in left shoulder: Secondary | ICD-10-CM

## 2023-08-19 DIAGNOSIS — M1990 Unspecified osteoarthritis, unspecified site: Secondary | ICD-10-CM

## 2023-08-19 NOTE — Telephone Encounter (Signed)
 Pt called and forgot to make 6 wk follow up post op. Put pt on 8/6. See if pt need sooner appt.

## 2023-08-19 NOTE — Therapy (Signed)
 OUTPATIENT PHYSICAL THERAPY SHOULDER TREATMENT   Patient Name: Diane Grant MRN: [id number] DOB:04/16/1950, 73 y.o., female Today's Date: 08/19/2023  END OF SESSION:  PT End of Session - 08/19/23 0849     Visit Number 17    Number of Visits 20    Date for PT Re-Evaluation 08/30/23    PT Start Time 0845    PT Stop Time 0900    PT Time Calculation (min) 15 min               Past Medical History:  Diagnosis Date   Allergy     Asthma    Degenerative disc disease, lumbar    GERD (gastroesophageal reflux disease)    Hypertension    Hypothyroidism    Thyroid  disease    hypothyroidism   Ulcer    Past Surgical History:  Procedure Laterality Date   ABDOMINAL HYSTERECTOMY     DILATION AND CURETTAGE OF UTERUS     REVERSE SHOULDER ARTHROPLASTY Left 06/11/2023   Procedure: ARTHROPLASTY, SHOULDER, TOTAL, REVERSE;  Surgeon: Genelle Standing, MD;  Location: [facility];  Service: Orthopedics;  Laterality: Left;   ROTATOR CUFF REPAIR  11/30/2015   Right shoulder   Patient Active Problem List   Diagnosis Date Noted   Nontraumatic complete tear of left rotator cuff 06/11/2023   Moderate persistent asthma without complication 04/24/2023   Laryngopharyngeal reflux (LPR) 04/24/2023   Allergy  to alpha-gal 04/24/2023   Second hand tobacco smoke exposure 06/15/2021   Other allergic rhinitis 06/15/2021   Other adverse food reactions, not elsewhere classified, subsequent encounter 06/15/2021   Irritable bowel syndrome with constipation 11/16/2020   Cough 03/29/2020   Acute pharyngitis 03/29/2020   Urticaria 11/27/2019   Palpitations 06/16/2018   Chest pain of uncertain etiology 06/16/2018   Shortness of breath 06/16/2018   Educated about COVID-19 virus infection 06/16/2018   Change in voice 03/08/2017   Degenerative disc disease, lumbar 03/08/2017   Hypothyroidism 06/26/2013   Essential hypertension 06/26/2013    PCP: Colette Torrence GRADE, MD  REFERRING  PROVIDER: Genelle Standing, MD   REFERRING DIAG: Nontraumatic complete tear of left rotator cuff   THERAPY DIAG:  Acute pain of left shoulder  Stiffness of left shoulder, not elsewhere classified  Rationale for Evaluation and Treatment: Rehabilitation  ONSET DATE: 06/11/23  SUBJECTIVE:                                                                                                                                                                                      SUBJECTIVE STATEMENT: Pt reports continued left shoulder soreness with pain reaching 2-3/10.  Pt reports performing more activities at home  with her affected UE.   Hand dominance: Right  PERTINENT HISTORY: Hypertension, asthma, and allergies  PAIN:  Are you having pain? Yes: NPRS scale: 2-3/10 Pain location: left shoulder and arm Pain description: constant aching and throbbing Aggravating factors: coughing Relieving factors: ice and medication   PRECAUTIONS: Shoulder  RED FLAGS: None   WEIGHT BEARING RESTRICTIONS: NWB on LUE  FALLS:  Has patient fallen in last 6 months? No  LIVING ENVIRONMENT: Lives with: lives with their family Lives in: House/apartment Has following equipment at home: sling with abduction pillow  OCCUPATION: Retired   PLOF: Independent  PATIENT GOALS:be able garden and working outside, reduced pain, and improved mobility  NEXT MD VISIT: none scheduled  OBJECTIVE:  Note: Objective measures were completed at Evaluation unless otherwise noted.  DIAGNOSTIC FINDINGS: 06/11/23 left shoulder x-ray IMPRESSION: Baseline radiograph status post left reverse shoulder arthroplasty.  PATIENT SURVEYS:  Quick Dash 95.45  COGNITION: Overall cognitive status: Within functional limits for tasks assessed     SENSATION: Patient reports numbness in her left forearm and hand  UPPER EXTREMITY ROM:   Active ROM Right eval Left Eval (PROM)  Shoulder flexion 126 60  Shoulder extension     Shoulder abduction 126 55  Shoulder adduction    Shoulder internal rotation    Shoulder external rotation 30 5  Elbow flexion    Elbow extension    Wrist flexion    Wrist extension    Wrist ulnar deviation    Wrist radial deviation    Wrist pronation    Wrist supination     07/18/23:  In supine P/AROM left shoulder flexion to 125 degrees.  UPPER EXTREMITY MMT: not tested due to surgical condition  SHOULDER SPECIAL TESTS: Not tested due to surgical condition  PALPATION:  TTP: left upper trapezius, supraspinatus, infraspinatus, deltoid, biceps, and triceps                                                                                                                             TREATMENT DATE:                                    08/19/23 EXERCISE LOG  LT shldr  Exercise Repetitions and Resistance Comments  Pulleys  7 minutes   Standing UE ranger 4.5 mins   Wall ladder   Max #25  Resisted row Green t-band x 20 reps    Resisted Ext  Green t-band x 20 reps    Punches Red x 20 reps    IR  Red x 20 reps   L bicep curl     L shoulder ER     L shoulder IR     Wall ladder with lift off    Wall ball   Multidirectional  Ball roll out MAT/Wedge  5x10 Flexion   Blank cell = exercise not performed today  Manual Therapy Soft Tissue Mobilization: left shoulder, STW/M to short head of left bicep , Post. Delt, levator, and Utrap to decrease pain and tone.   Modalities  Date:  Unattended Estim: Shoulder, IFC 80-150 Hz, 15 mins, Pain and Tone and  Vaso x 15 mins low                                    07/30/23  EXERCISE LOG   7weeks 07-30-23  Exercise Repetitions and Resistance Comments  Pulleys  5 minutes  Flexion  UE ranger (seated)  4.5 minutes  Multidirectional  UE ranger (standing)    Flexion   Resisted row Green t-band 2x10   Resisted pull down  Green t-band x 20 reps    Wall ladder  X 10 with pulling hand off the ladder for isometric hold at top #25 max   L shoulder ADD    Red t-band x 2 minutes    AA L shoulder ABD   and flexion 2 minutes each way with focus on eccentrics   AA cane punch out     Cone in/out of cabinet 1st shelf x10    Blank cell = exercise not performed today  Modalities: no redness or adverse reaction to today's modalities Manual AAROM for flexion Date:  Unattended Estim: left deltoid, pre mod @ 80-150 Hz, 15 mins, Pain and Tone Reviewed and progressed HEP incase  Pt  is DC 07/18/23:                                     EXERCISE LOG  Exercise Repetitions and Resistance Comments  Pulleys 5 minutes   UE Ranger (seated) 5 minutes   Wall ladder 4 minutes (23)   Towel on wall 4 minutes       f/b gentle PROM into left shoulder flexion and and ER x 9 minutes per protocol guidelines f/b low-level (non-motoric) IFC at 80-150 Hz on 40% scan x 15 minutes.     PATIENT EDUCATION: Education details: healing Person educated: Patient Education method: Explanation Education comprehension: verbalized understanding  HOME EXERCISE PROGRAM:   ASSESSMENT:  CLINICAL IMPRESSION: Pt arrives for today's treatment session reporting 2-3/10  anterior left shoulder pain.  Pt reports continued, constant left shoulder soreness.  Pt concerned about possible infection, but no signs or symptoms found.  Pt able to tolerate increased time with various previously performed exercises.  Pt able to tolerate increased resistance with rows and pull-downs today without discomfort.  Pt also able to ready the second shelf with cones today.  STW/M performed to left anterior shoulder musculature and left bicep to decrease pain and tone.  Normal responses to estim and vaso noted upon removal.  Pt reported 1/10 left shoulder pain at completion of today's treatment session.    OBJECTIVE IMPAIRMENTS: decreased activity tolerance, decreased ROM, decreased strength, hypomobility, impaired sensation, impaired tone, impaired UE functional use, and pain.   ACTIVITY LIMITATIONS:  carrying, lifting, sleeping, bathing, dressing, reach over head, and hygiene/grooming  PARTICIPATION LIMITATIONS: meal prep, cleaning, laundry, shopping, community activity, and yard work  PERSONAL FACTORS: Age, Past/current experiences, Time since onset of injury/illness/exacerbation, Transportation, and 3+ comorbidities: Hypertension, asthma, and allergies are also affecting patient's functional outcome.   REHAB POTENTIAL: Good  CLINICAL DECISION MAKING: Evolving/moderate complexity  EVALUATION COMPLEXITY: Moderate   GOALS: Goals reviewed  with patient? Yes  SHORT TERM GOALS: Target date: 07/05/23  Patient will be independent with her initial HEP.  Baseline: Goal status: MET  2.  Patient will be able to demonstrate at least 90 degrees of left shoulder flexion for improved function reaching.  Baseline: 5/29: AA   120degrees     AROM  90 degrees Goal status:  MET  3.  Patient will be able to demonstrate at least 90 degrees of left shoulder abduction for improved function reaching.  Baseline: 6/5: AA 92 degrees       80 degrees Goal status: PARTIALLY MET  4.  Patient will improve her QuickDASH score to 75 or less for improved perceived function with her daily activities.  Baseline: 6/5: 56.8  Goal status: MET  LONG TERM GOALS: Target date: 07/26/23  Patient will be independent with her advanced HEP.  Baseline:  Goal status: IN PROGRESS  2.  Patient will be able to demonstrate at least 120 degrees of active left shoulder flexion for improved function reaching overhead.  Baseline:  Goal status: IN PROGRESS   90 degrees  3.  Patient will be able to demonstrate at least 120 degrees of active left shoulder abduction for improved function reaching overhead.  Baseline:  Goal status: IN PROGRESS   80 degrees  4.  Patient will be able to carry at least 5 pounds in her left hand for improved function gardening.  Baseline:  Goal status: in Progress  5.  Patient will improve her  QuickDASH score to 50 or less for improved perceived function with her daily activities.  Baseline:  Goal status: IN PROGRESS    59.09  PLAN:  PT FREQUENCY: 2x/week  PT DURATION: 6 weeks  PLANNED INTERVENTIONS: 97164- PT Re-evaluation, 97750- Physical Performance Testing, 97110-Therapeutic exercises, 97530- Therapeutic activity, 97112- Neuromuscular re-education, 97535- Self Care, 02859- Manual therapy, G0283- Electrical stimulation (unattended), 97016- Vasopneumatic device, Patient/Family education, Joint mobilization, Cryotherapy, and Moist heat  PLAN :    Delon DELENA Gosling, PTA 08/19/2023, 9:40 AM

## 2023-08-21 ENCOUNTER — Ambulatory Visit

## 2023-08-21 DIAGNOSIS — M25512 Pain in left shoulder: Secondary | ICD-10-CM

## 2023-08-21 DIAGNOSIS — M25612 Stiffness of left shoulder, not elsewhere classified: Secondary | ICD-10-CM | POA: Diagnosis not present

## 2023-08-21 NOTE — Therapy (Signed)
 OUTPATIENT PHYSICAL THERAPY SHOULDER TREATMENT   Patient Name: Diane Grant MRN: [id number] DOB:09-15-1950, 73 y.o., female Today's Date: 08/21/2023  END OF SESSION:  PT End of Session - 08/21/23 0849     Visit Number 18    Number of Visits 20    Date for PT Re-Evaluation 08/30/23    PT Start Time 0845           Past Medical History:  Diagnosis Date   Allergy     Asthma    Degenerative disc disease, lumbar    GERD (gastroesophageal reflux disease)    Hypertension    Hypothyroidism    Thyroid  disease    hypothyroidism   Ulcer    Past Surgical History:  Procedure Laterality Date   ABDOMINAL HYSTERECTOMY     DILATION AND CURETTAGE OF UTERUS     REVERSE SHOULDER ARTHROPLASTY Left 06/11/2023   Procedure: ARTHROPLASTY, SHOULDER, TOTAL, REVERSE;  Surgeon: Genelle Standing, MD;  Location: [facility];  Service: Orthopedics;  Laterality: Left;   ROTATOR CUFF REPAIR  11/30/2015   Right shoulder   Patient Active Problem List   Diagnosis Date Noted   Nontraumatic complete tear of left rotator cuff 06/11/2023   Moderate persistent asthma without complication 04/24/2023   Laryngopharyngeal reflux (LPR) 04/24/2023   Allergy  to alpha-gal 04/24/2023   Second hand tobacco smoke exposure 06/15/2021   Other allergic rhinitis 06/15/2021   Other adverse food reactions, not elsewhere classified, subsequent encounter 06/15/2021   Irritable bowel syndrome with constipation 11/16/2020   Cough 03/29/2020   Acute pharyngitis 03/29/2020   Urticaria 11/27/2019   Palpitations 06/16/2018   Chest pain of uncertain etiology 06/16/2018   Shortness of breath 06/16/2018   Educated about COVID-19 virus infection 06/16/2018   Change in voice 03/08/2017   Degenerative disc disease, lumbar 03/08/2017   Hypothyroidism 06/26/2013   Essential hypertension 06/26/2013    PCP: Colette Torrence GRADE, MD  REFERRING PROVIDER: Genelle Standing, MD   REFERRING DIAG: Nontraumatic  complete tear of left rotator cuff   THERAPY DIAG:  Acute pain of left shoulder  Stiffness of left shoulder, not elsewhere classified  Rationale for Evaluation and Treatment: Rehabilitation  ONSET DATE: 06/11/23  SUBJECTIVE:                                                                                                                                                                                      SUBJECTIVE STATEMENT: Pt reports continued left shoulder soreness with pain reaching 2-3/10.  Pt reports performing more activities at home with her affected UE.   Hand dominance: Right  PERTINENT HISTORY: Hypertension, asthma, and allergies  PAIN:  Are  you having pain? Yes: NPRS scale: 2-3/10 Pain location: left shoulder and arm Pain description: constant aching and throbbing Aggravating factors: coughing Relieving factors: ice and medication   PRECAUTIONS: Shoulder  RED FLAGS: None   WEIGHT BEARING RESTRICTIONS: NWB on LUE  FALLS:  Has patient fallen in last 6 months? No  LIVING ENVIRONMENT: Lives with: lives with their family Lives in: House/apartment Has following equipment at home: sling with abduction pillow  OCCUPATION: Retired   PLOF: Independent  PATIENT GOALS:be able garden and working outside, reduced pain, and improved mobility  NEXT MD VISIT: none scheduled  OBJECTIVE:  Note: Objective measures were completed at Evaluation unless otherwise noted.  DIAGNOSTIC FINDINGS: 06/11/23 left shoulder x-ray IMPRESSION: Baseline radiograph status post left reverse shoulder arthroplasty.  PATIENT SURVEYS:  Quick Dash 95.45  COGNITION: Overall cognitive status: Within functional limits for tasks assessed     SENSATION: Patient reports numbness in her left forearm and hand  UPPER EXTREMITY ROM:   Active ROM Right eval Left Eval (PROM)  Shoulder flexion 126 60  Shoulder extension    Shoulder abduction 126 55  Shoulder adduction    Shoulder internal  rotation    Shoulder external rotation 30 5  Elbow flexion    Elbow extension    Wrist flexion    Wrist extension    Wrist ulnar deviation    Wrist radial deviation    Wrist pronation    Wrist supination     07/18/23:  In supine P/AROM left shoulder flexion to 125 degrees.  UPPER EXTREMITY MMT: not tested due to surgical condition  SHOULDER SPECIAL TESTS: Not tested due to surgical condition  PALPATION:  TTP: left upper trapezius, supraspinatus, infraspinatus, deltoid, biceps, and triceps                                                                                                                             TREATMENT DATE:                                    7/16/5 EXERCISE LOG  LT shldr  Exercise Repetitions and Resistance Comments  Pulleys  7 minutes   Standing UE ranger 5 mins   Wall ladder   Max #25  Items to Shelf 5 cones to 2nd shelf x 3; 1# to 2nd shelf 2 sets 10   Resisted row Green t-band x 25 reps    Resisted Ext  Green t-band x 25 reps    Punches Red x 25 reps    IR  Red x 25 reps   L bicep curl     L shoulder ER     L shoulder IR     Wall ladder with lift off    Wall ball   Multidirectional  Ball roll out MAT/Wedge   Flexion   Blank cell = exercise not performed today   Manual  Therapy Soft Tissue Mobilization: left shoulder, STW/M to short head of left bicep , Post. Delt, levator, and Utrap to decrease pain and tone.   Modalities  Date:  Unattended Estim: Shoulder, IFC 80-150 Hz, 15 mins, Pain and Tone and  Vaso x 15 mins low                                    07/30/23  EXERCISE LOG   7weeks 07-30-23  Exercise Repetitions and Resistance Comments  Pulleys  5 minutes  Flexion  UE ranger (seated)  4.5 minutes  Multidirectional  UE ranger (standing)    Flexion   Resisted row Green t-band 2x10   Resisted pull down  Green t-band x 20 reps    Wall ladder  X 10 with pulling hand off the ladder for isometric hold at top #25 max   L shoulder ADD   Red  t-band x 2 minutes    AA L shoulder ABD   and flexion 2 minutes each way with focus on eccentrics   AA cane punch out     Cone in/out of cabinet 1st shelf x10    Blank cell = exercise not performed today  Modalities: no redness or adverse reaction to today's modalities Manual AAROM for flexion Date:  Unattended Estim: left deltoid, pre mod @ 80-150 Hz, 15 mins, Pain and Tone Reviewed and progressed HEP incase  Pt  is DC 07/18/23:                                     EXERCISE LOG  Exercise Repetitions and Resistance Comments  Pulleys 5 minutes   UE Ranger (seated) 5 minutes   Wall ladder 4 minutes (23)   Towel on wall 4 minutes       f/b gentle PROM into left shoulder flexion and and ER x 9 minutes per protocol guidelines f/b low-level (non-motoric) IFC at 80-150 Hz on 40% scan x 15 minutes.     PATIENT EDUCATION: Education details: healing Person educated: Patient Education method: Explanation Education comprehension: verbalized understanding  HOME EXERCISE PROGRAM:   ASSESSMENT:  CLINICAL IMPRESSION: Pt arrives for today's treatment session reporting 2-3/10  anterior left shoulder pain.  Pt reports continued achy soreness in left shoulder.  Pt able to tolerate progressing to putting objects on second shelf today.  Pt with fatigue at completion of shelf activity.  Pt able to tolerate increased reps with t-band exercises today as well with minimal fatigue.  STW/M performed to left shoulder musculature to decrease pain and tone.  Normal responses to estim and vaso noted upon removal.  Pt denied any pain at completion of today's treatment session.   OBJECTIVE IMPAIRMENTS: decreased activity tolerance, decreased ROM, decreased strength, hypomobility, impaired sensation, impaired tone, impaired UE functional use, and pain.   ACTIVITY LIMITATIONS: carrying, lifting, sleeping, bathing, dressing, reach over head, and hygiene/grooming  PARTICIPATION LIMITATIONS: meal prep, cleaning,  laundry, shopping, community activity, and yard work  PERSONAL FACTORS: Age, Past/current experiences, Time since onset of injury/illness/exacerbation, Transportation, and 3+ comorbidities: Hypertension, asthma, and allergies are also affecting patient's functional outcome.   REHAB POTENTIAL: Good  CLINICAL DECISION MAKING: Evolving/moderate complexity  EVALUATION COMPLEXITY: Moderate   GOALS: Goals reviewed with patient? Yes  SHORT TERM GOALS: Target date: 07/05/23  Patient will be independent with her initial HEP.  Baseline: Goal status: MET  2.  Patient will be able to demonstrate at least 90 degrees of left shoulder flexion for improved function reaching.  Baseline: 5/29: AA   120degrees     AROM  90 degrees Goal status:  MET  3.  Patient will be able to demonstrate at least 90 degrees of left shoulder abduction for improved function reaching.  Baseline: 6/5: AA 92 degrees       80 degrees Goal status: PARTIALLY MET  4.  Patient will improve her QuickDASH score to 75 or less for improved perceived function with her daily activities.  Baseline: 6/5: 56.8  Goal status: MET  LONG TERM GOALS: Target date: 07/26/23  Patient will be independent with her advanced HEP.  Baseline:  Goal status: IN PROGRESS  2.  Patient will be able to demonstrate at least 120 degrees of active left shoulder flexion for improved function reaching overhead.  Baseline:  Goal status: IN PROGRESS   90 degrees  3.  Patient will be able to demonstrate at least 120 degrees of active left shoulder abduction for improved function reaching overhead.  Baseline:  Goal status: IN PROGRESS   80 degrees  4.  Patient will be able to carry at least 5 pounds in her left hand for improved function gardening.  Baseline:  Goal status: in Progress  5.  Patient will improve her QuickDASH score to 50 or less for improved perceived function with her daily activities.  Baseline:  Goal status: IN PROGRESS     59.09  PLAN:  PT FREQUENCY: 2x/week  PT DURATION: 6 weeks  PLANNED INTERVENTIONS: 97164- PT Re-evaluation, 97750- Physical Performance Testing, 97110-Therapeutic exercises, 97530- Therapeutic activity, 97112- Neuromuscular re-education, 97535- Self Care, 02859- Manual therapy, G0283- Electrical stimulation (unattended), 97016- Vasopneumatic device, Patient/Family education, Joint mobilization, Cryotherapy, and Moist heat  PLAN :    Delon DELENA Gosling, PTA 08/21/2023, 9:51 AM

## 2023-08-28 ENCOUNTER — Ambulatory Visit

## 2023-08-28 DIAGNOSIS — M25612 Stiffness of left shoulder, not elsewhere classified: Secondary | ICD-10-CM

## 2023-08-28 DIAGNOSIS — M25512 Pain in left shoulder: Secondary | ICD-10-CM | POA: Diagnosis not present

## 2023-08-28 NOTE — Therapy (Signed)
 OUTPATIENT PHYSICAL THERAPY SHOULDER TREATMENT   Patient Name: Diane Grant MRN: [id number] DOB:12-08-1950, 73 y.o., female Today's Date: 08/28/2023  END OF SESSION:  PT End of Session - 08/28/23 0848     Visit Number 19    Number of Visits 20    Date for PT Re-Evaluation 08/30/23    PT Start Time 0845    PT Stop Time 0944    PT Time Calculation (min) 59 min           Past Medical History:  Diagnosis Date   Allergy     Asthma    Degenerative disc disease, lumbar    GERD (gastroesophageal reflux disease)    Hypertension    Hypothyroidism    Thyroid  disease    hypothyroidism   Ulcer    Past Surgical History:  Procedure Laterality Date   ABDOMINAL HYSTERECTOMY     DILATION AND CURETTAGE OF UTERUS     REVERSE SHOULDER ARTHROPLASTY Left 06/11/2023   Procedure: ARTHROPLASTY, SHOULDER, TOTAL, REVERSE;  Surgeon: Genelle Standing, MD;  Location: [facility];  Service: Orthopedics;  Laterality: Left;   ROTATOR CUFF REPAIR  11/30/2015   Right shoulder   Patient Active Problem List   Diagnosis Date Noted   Nontraumatic complete tear of left rotator cuff 06/11/2023   Moderate persistent asthma without complication 04/24/2023   Laryngopharyngeal reflux (LPR) 04/24/2023   Allergy  to alpha-gal 04/24/2023   Second hand tobacco smoke exposure 06/15/2021   Other allergic rhinitis 06/15/2021   Other adverse food reactions, not elsewhere classified, subsequent encounter 06/15/2021   Irritable bowel syndrome with constipation 11/16/2020   Cough 03/29/2020   Acute pharyngitis 03/29/2020   Urticaria 11/27/2019   Palpitations 06/16/2018   Chest pain of uncertain etiology 06/16/2018   Shortness of breath 06/16/2018   Educated about COVID-19 virus infection 06/16/2018   Change in voice 03/08/2017   Degenerative disc disease, lumbar 03/08/2017   Hypothyroidism 06/26/2013   Essential hypertension 06/26/2013    PCP: Colette Torrence GRADE, MD  REFERRING  PROVIDER: Genelle Standing, MD   REFERRING DIAG: Nontraumatic complete tear of left rotator cuff   THERAPY DIAG:  Acute pain of left shoulder  Stiffness of left shoulder, not elsewhere classified  Rationale for Evaluation and Treatment: Rehabilitation  ONSET DATE: 06/11/23  SUBJECTIVE:                                                                                                                                                                                      SUBJECTIVE STATEMENT: Pt reports 1-2/10 left shoulder pain.   Hand dominance: Right  PERTINENT HISTORY: Hypertension, asthma, and allergies  PAIN:  Are  you having pain? Yes: NPRS scale: 1-2/10 Pain location: left shoulder and arm Pain description: constant aching and throbbing Aggravating factors: coughing Relieving factors: ice and medication   PRECAUTIONS: Shoulder  RED FLAGS: None   WEIGHT BEARING RESTRICTIONS: NWB on LUE  FALLS:  Has patient fallen in last 6 months? No  LIVING ENVIRONMENT: Lives with: lives with their family Lives in: House/apartment Has following equipment at home: sling with abduction pillow  OCCUPATION: Retired   PLOF: Independent  PATIENT GOALS:be able garden and working outside, reduced pain, and improved mobility  NEXT MD VISIT: none scheduled  OBJECTIVE:  Note: Objective measures were completed at Evaluation unless otherwise noted.  DIAGNOSTIC FINDINGS: 06/11/23 left shoulder x-ray IMPRESSION: Baseline radiograph status post left reverse shoulder arthroplasty.  PATIENT SURVEYS:  Quick Dash 95.45  COGNITION: Overall cognitive status: Within functional limits for tasks assessed     SENSATION: Patient reports numbness in her left forearm and hand  UPPER EXTREMITY ROM:   Active ROM Right eval Left Eval (PROM)  Shoulder flexion 126 60  Shoulder extension    Shoulder abduction 126 55  Shoulder adduction    Shoulder internal rotation    Shoulder external rotation  30 5  Elbow flexion    Elbow extension    Wrist flexion    Wrist extension    Wrist ulnar deviation    Wrist radial deviation    Wrist pronation    Wrist supination     07/18/23:  In supine P/AROM left shoulder flexion to 125 degrees.  UPPER EXTREMITY MMT: not tested due to surgical condition  SHOULDER SPECIAL TESTS: Not tested due to surgical condition  PALPATION:  TTP: left upper trapezius, supraspinatus, infraspinatus, deltoid, biceps, and triceps                                                                                                                             TREATMENT DATE:   08/28/23    EXERCISE LOG  LT shldr  Exercise Repetitions and Resistance Comments  UBE 6 mins 120 RPM  (forward/backward)   Pulleys  7 minutes (flexion and abduction)   Standing UE ranger 5 mins   Wall ladder  5 reps Max #26  Items to Shelf 5 cones to 2nd shelf x 3; 1# to 2nd shelf 3 sets 10   Resisted row Green t-band x 25 reps    Resisted Ext  Green t-band x 25 reps    Punches Red x 25 reps    IR  Red x 25 reps   Flexion 1# x 10 reps   Abduction 1# x 10 reps   L bicep curl     L shoulder ER     L shoulder IR     Wall ladder with lift off    Wall ball   Multidirectional  Ball roll out MAT/Wedge   Flexion   Blank cell = exercise not performed today   Manual Therapy Soft Tissue  Mobilization: left shoulder, STW/M to short head of left bicep , Post. Delt, levator, and Utrap to decrease pain and tone.   Modalities  Date:  Unattended Estim: Shoulder, IFC 80-150 Hz, 15 mins, Pain and Tone and  Vaso x 15 mins low                                    07/30/23  EXERCISE LOG   7weeks 07-30-23  Exercise Repetitions and Resistance Comments  Pulleys  5 minutes  Flexion  UE ranger (seated)  4.5 minutes  Multidirectional  UE ranger (standing)    Flexion   Resisted row Green t-band 2x10   Resisted pull down  Green t-band x 20 reps    Wall ladder  X 10 with pulling hand off the ladder for  isometric hold at top #25 max   L shoulder ADD   Red t-band x 2 minutes    AA L shoulder ABD   and flexion 2 minutes each way with focus on eccentrics   AA cane punch out     [facility] in/out of cabinet 1st shelf x10    Blank cell = exercise not performed today  Modalities: no redness or adverse reaction to today's modalities Manual AAROM for flexion Date:  Unattended Estim: left deltoid, pre mod @ 80-150 Hz, 15 mins, Pain and Tone Reviewed and progressed HEP incase  Pt  is DC 07/18/23:                                     EXERCISE LOG  Exercise Repetitions and Resistance Comments  Pulleys 5 minutes   UE Ranger (seated) 5 minutes   Wall ladder 4 minutes (23)   Towel on wall 4 minutes       f/b gentle PROM into left shoulder flexion and and ER x 9 minutes per protocol guidelines f/b low-level (non-motoric) IFC at 80-150 Hz on 40% scan x 15 minutes.     PATIENT EDUCATION: Education details: healing Person educated: Patient Education method: Explanation Education comprehension: verbalized understanding  HOME EXERCISE PROGRAM:   ASSESSMENT:  CLINICAL IMPRESSION: Pt arrives for today's treatment session reporting 1-2/10  anterior left shoulder pain.  Pt introduced to UBE today without any discomfort.  Pt able to reach 26 on the wall ladder today.  Pt reports performing all of her exercises at home to tolerance.  STW/M performed to anterior shoulder musculature to decrease pain and tone.  Normal responses to estim and vaso noted upon removal.  Pt denied pain at completion of today's treatment session.  OBJECTIVE IMPAIRMENTS: decreased activity tolerance, decreased ROM, decreased strength, hypomobility, impaired sensation, impaired tone, impaired UE functional use, and pain.   ACTIVITY LIMITATIONS: carrying, lifting, sleeping, bathing, dressing, reach over head, and hygiene/grooming  PARTICIPATION LIMITATIONS: meal prep, cleaning, laundry, shopping, community activity, and yard  work  PERSONAL FACTORS: Age, Past/current experiences, Time since onset of injury/illness/exacerbation, Transportation, and 3+ comorbidities: Hypertension, asthma, and allergies are also affecting patient's functional outcome.   REHAB POTENTIAL: Good  CLINICAL DECISION MAKING: Evolving/moderate complexity  EVALUATION COMPLEXITY: Moderate   GOALS: Goals reviewed with patient? Yes  SHORT TERM GOALS: Target date: 07/05/23  Patient will be independent with her initial HEP.  Baseline: Goal status: MET  2.  Patient will be able to demonstrate at least 90 degrees of left  shoulder flexion for improved function reaching.  Baseline: 5/29: AA   120degrees     AROM  90 degrees Goal status:  MET  3.  Patient will be able to demonstrate at least 90 degrees of left shoulder abduction for improved function reaching.  Baseline: 6/5: AA 92 degrees       80 degrees Goal status: PARTIALLY MET  4.  Patient will improve her QuickDASH score to 75 or less for improved perceived function with her daily activities.  Baseline: 6/5: 56.8  Goal status: MET  LONG TERM GOALS: Target date: 07/26/23  Patient will be independent with her advanced HEP.  Baseline:  Goal status: IN PROGRESS  2.  Patient will be able to demonstrate at least 120 degrees of active left shoulder flexion for improved function reaching overhead.  Baseline:  Goal status: IN PROGRESS   90 degrees  3.  Patient will be able to demonstrate at least 120 degrees of active left shoulder abduction for improved function reaching overhead.  Baseline:  Goal status: IN PROGRESS   80 degrees  4.  Patient will be able to carry at least 5 pounds in her left hand for improved function gardening.  Baseline:  Goal status: in Progress  5.  Patient will improve her QuickDASH score to 50 or less for improved perceived function with her daily activities.  Baseline:  Goal status: IN PROGRESS    59.09  PLAN:  PT FREQUENCY: 2x/week  PT DURATION:  6 weeks  PLANNED INTERVENTIONS: 97164- PT Re-evaluation, 97750- Physical Performance Testing, 97110-Therapeutic exercises, 97530- Therapeutic activity, [id number]- Neuromuscular re-education, 97535- Self Care, 02859- Manual therapy, [id number]- Electrical stimulation (unattended), 97016- Vasopneumatic device, Patient/Family education, Joint mobilization, Cryotherapy, and Moist heat  PLAN :    Delon DELENA Gosling, PTA 08/28/2023, 11:08 AM

## 2023-09-04 ENCOUNTER — Ambulatory Visit

## 2023-09-04 DIAGNOSIS — M25612 Stiffness of left shoulder, not elsewhere classified: Secondary | ICD-10-CM | POA: Diagnosis not present

## 2023-09-04 DIAGNOSIS — M25512 Pain in left shoulder: Secondary | ICD-10-CM

## 2023-09-04 NOTE — Therapy (Addendum)
 OUTPATIENT PHYSICAL THERAPY SHOULDER TREATMENT   Patient Name: Diane Grant MRN: [id number] DOB:10-27-1950, 73 y.o., female Today's Date: 09/04/2023  END OF SESSION:  PT End of Session - 09/04/23 0848     Visit Number 20    Number of Visits 20    Date for PT Re-Evaluation 08/30/23    PT Start Time 0845    PT Stop Time 0920    PT Time Calculation (min) 35 min           Past Medical History:  Diagnosis Date   Allergy     Asthma    Degenerative disc disease, lumbar    GERD (gastroesophageal reflux disease)    Hypertension    Hypothyroidism    Thyroid  disease    hypothyroidism   Ulcer    Past Surgical History:  Procedure Laterality Date   ABDOMINAL HYSTERECTOMY     DILATION AND CURETTAGE OF UTERUS     REVERSE SHOULDER ARTHROPLASTY Left 06/11/2023   Procedure: ARTHROPLASTY, SHOULDER, TOTAL, REVERSE;  Surgeon: Genelle Standing, MD;  Location: [facility];  Service: Orthopedics;  Laterality: Left;   ROTATOR CUFF REPAIR  11/30/2015   Right shoulder   Patient Active Problem List   Diagnosis Date Noted   Nontraumatic complete tear of left rotator cuff 06/11/2023   Moderate persistent asthma without complication 04/24/2023   Laryngopharyngeal reflux (LPR) 04/24/2023   Allergy  to alpha-gal 04/24/2023   Second hand tobacco smoke exposure 06/15/2021   Other allergic rhinitis 06/15/2021   Other adverse food reactions, not elsewhere classified, subsequent encounter 06/15/2021   Irritable bowel syndrome with constipation 11/16/2020   Cough 03/29/2020   Acute pharyngitis 03/29/2020   Urticaria 11/27/2019   Palpitations 06/16/2018   Chest pain of uncertain etiology 06/16/2018   Shortness of breath 06/16/2018   Educated about COVID-19 virus infection 06/16/2018   Change in voice 03/08/2017   Degenerative disc disease, lumbar 03/08/2017   Hypothyroidism 06/26/2013   Essential hypertension 06/26/2013    PCP: Colette Torrence GRADE, MD  REFERRING  PROVIDER: Genelle Standing, MD   REFERRING DIAG: Nontraumatic complete tear of left rotator cuff   THERAPY DIAG:  Acute pain of left shoulder  Stiffness of left shoulder, not elsewhere classified  Rationale for Evaluation and Treatment: Rehabilitation  ONSET DATE: 06/11/23  SUBJECTIVE:                                                                                                                                                                                      SUBJECTIVE STATEMENT: Pt reports 1-2/10 left shoulder pain.   Hand dominance: Right  PERTINENT HISTORY: Hypertension, asthma, and allergies  PAIN:  Are  you having pain? Yes: NPRS scale: 1-2/10 Pain location: left shoulder and arm Pain description: constant aching and throbbing Aggravating factors: coughing Relieving factors: ice and medication   PRECAUTIONS: Shoulder  RED FLAGS: None   WEIGHT BEARING RESTRICTIONS: NWB on LUE  FALLS:  Has patient fallen in last 6 months? No  LIVING ENVIRONMENT: Lives with: lives with their family Lives in: House/apartment Has following equipment at home: sling with abduction pillow  OCCUPATION: Retired   PLOF: Independent  PATIENT GOALS:be able garden and working outside, reduced pain, and improved mobility  NEXT MD VISIT: none scheduled  OBJECTIVE:  Note: Objective measures were completed at Evaluation unless otherwise noted.  DIAGNOSTIC FINDINGS: 06/11/23 left shoulder x-ray IMPRESSION: Baseline radiograph status post left reverse shoulder arthroplasty.  PATIENT SURVEYS:  Quick Dash 95.45  COGNITION: Overall cognitive status: Within functional limits for tasks assessed     SENSATION: Patient reports numbness in her left forearm and hand  UPPER EXTREMITY ROM:   Active ROM Right eval Left Eval (PROM)  Shoulder flexion 126 60  Shoulder extension    Shoulder abduction 126 55  Shoulder adduction    Shoulder internal rotation    Shoulder external rotation  30 5  Elbow flexion    Elbow extension    Wrist flexion    Wrist extension    Wrist ulnar deviation    Wrist radial deviation    Wrist pronation    Wrist supination     07/18/23:  In supine P/AROM left shoulder flexion to 125 degrees.  UPPER EXTREMITY MMT: not tested due to surgical condition  SHOULDER SPECIAL TESTS: Not tested due to surgical condition  PALPATION:  TTP: left upper trapezius, supraspinatus, infraspinatus, deltoid, biceps, and triceps                                                                                                                             TREATMENT DATE:   09/04/23    EXERCISE LOG  LT shldr  Exercise Repetitions and Resistance Comments  UBE 10 mins 120 RPM  (forward/backward)   Pulleys  7 minutes (flexion and abduction)   Standing UE ranger 6 mins   Wall ladder   Max #26  Items to Shelf    Resisted row    Resisted Ext     Punches    IR     Flexion    Abduction    L bicep curl     L shoulder ER     L shoulder IR     Wall ladder with lift off    Wall ball   Multidirectional  Ball roll out MAT/Wedge   Flexion   Blank cell = exercise not performed today                                     07/30/23  EXERCISE LOG   7weeks  07-30-23  Exercise Repetitions and Resistance Comments  Pulleys  5 minutes  Flexion  UE ranger (seated)  4.5 minutes  Multidirectional  UE ranger (standing)    Flexion   Resisted row Green t-band 2x10   Resisted pull down  Green t-band x 20 reps    Wall ladder  X 10 with pulling hand off the ladder for isometric hold at top #25 max   L shoulder ADD   Red t-band x 2 minutes    AA L shoulder ABD   and flexion 2 minutes each way with focus on eccentrics   AA cane punch out     [facility] in/out of cabinet 1st shelf x10    Blank cell = exercise not performed today  Modalities: no redness or adverse reaction to today's modalities Manual AAROM for flexion Date:  Unattended Estim: left deltoid, pre mod @ 80-150 Hz, 15 mins,  Pain and Tone Reviewed and progressed HEP incase  Pt  is DC 07/18/23:                                     EXERCISE LOG  Exercise Repetitions and Resistance Comments  Pulleys 5 minutes   UE Ranger (seated) 5 minutes   Wall ladder 4 minutes (23)   Towel on wall 4 minutes       f/b gentle PROM into left shoulder flexion and and ER x 9 minutes per protocol guidelines f/b low-level (non-motoric) IFC at 80-150 Hz on 40% scan x 15 minutes.     PATIENT EDUCATION: Education details: healing Person educated: Patient Education method: Explanation Education comprehension: verbalized understanding  HOME EXERCISE PROGRAM:   ASSESSMENT:  CLINICAL IMPRESSION: Pt arrives for today's treatment session reporting 1/10  anterior left shoulder pain.  Pt able to demonstrate 127 degrees of left shoulder flexion and 105 degrees of left shoulder abduction, meeting her flexion goal and making good progress towards her abduction goal.  Pt able to decrease QuickDASH score to 18.18 well surpassing her LTG.  Pt also able to pick up a 5 pound weight from the floor and ambulate 24 ft while carrying it without any discomfort.  Pt has met all of her goal except her abduction goal at this time and is ready for discharge.  Pt encouraged to call the facility with any questions or concerns.  Pt denied any pain at completion of today's treatment session.  Pt ready for discharge at this time.  PHYSICAL THERAPY DISCHARGE SUMMARY  Visits from Start of Care: 20  Current functional level related to goals / functional outcomes: Patient was able to meet or nearly meet all of her goals for skilled physical therapy.    Remaining deficits: Left shoulder abduction AROM    Education / Equipment: HEP    Patient agrees to discharge. Patient goals were partially met. Patient is being discharged due to being pleased with the current functional level.  Diane Grant, PT, DPT    OBJECTIVE IMPAIRMENTS: decreased activity  tolerance, decreased ROM, decreased strength, hypomobility, impaired sensation, impaired tone, impaired UE functional use, and pain.   ACTIVITY LIMITATIONS: carrying, lifting, sleeping, bathing, dressing, reach over head, and hygiene/grooming  PARTICIPATION LIMITATIONS: meal prep, cleaning, laundry, shopping, community activity, and yard work  PERSONAL FACTORS: Age, Past/current experiences, Time since onset of injury/illness/exacerbation, Transportation, and 3+ comorbidities: Hypertension, asthma, and allergies are also affecting patient's functional outcome.   REHAB POTENTIAL: Good  CLINICAL DECISION MAKING: Evolving/moderate complexity  EVALUATION COMPLEXITY: Moderate   GOALS: Goals reviewed with patient? Yes  SHORT TERM GOALS: Target date: 07/05/23  Patient will be independent with her initial HEP.  Baseline: Goal status: MET  2.  Patient will be able to demonstrate at least 90 degrees of left shoulder flexion for improved function reaching.  Baseline: 5/29: AA   120degrees     AROM  90 degrees Goal status:  MET  3.  Patient will be able to demonstrate at least 90 degrees of left shoulder abduction for improved function reaching.  Baseline: 6/5: AA 92 degrees; 7/30: 105 degrees Goal status: MET  4.  Patient will improve her QuickDASH score to 75 or less for improved perceived function with her daily activities.  Baseline: 6/5: 56.8  Goal status: MET  LONG TERM GOALS: Target date: 07/26/23  Patient will be independent with her advanced HEP.  Baseline:  Goal status: MET  2.  Patient will be able to demonstrate at least 120 degrees of active left shoulder flexion for improved function reaching overhead.  Baseline: 7/30: 127 degrees Goal status: MET  3.  Patient will be able to demonstrate at least 120 degrees of active left shoulder abduction for improved function reaching overhead.  Baseline: 7/30: 105 degrees Goal status: IN PROGRESS     4.  Patient will be able to  carry at least 5 pounds in her left hand for improved function gardening.  Baseline:  Goal status: MET  5.  Patient will improve her QuickDASH score to 50 or less for improved perceived function with her daily activities.  Baseline: 7/30: 18.18 Goal status: MET  PLAN:  PT FREQUENCY: 2x/week  PT DURATION: 6 weeks  PLANNED INTERVENTIONS: 97164- PT Re-evaluation, 97750- Physical Performance Testing, 97110-Therapeutic exercises, 97530- Therapeutic activity, 97112- Neuromuscular re-education, 97535- Self Care, 02859- Manual therapy, G0283- Electrical stimulation (unattended), 97016- Vasopneumatic device, Patient/Family education, Joint mobilization, Cryotherapy, and Moist heat  PLAN :    Delon DELENA Gosling, PTA 09/04/2023, 9:27 AM

## 2023-09-11 ENCOUNTER — Ambulatory Visit (HOSPITAL_BASED_OUTPATIENT_CLINIC_OR_DEPARTMENT_OTHER)

## 2023-09-11 ENCOUNTER — Ambulatory Visit (HOSPITAL_BASED_OUTPATIENT_CLINIC_OR_DEPARTMENT_OTHER): Admitting: Orthopaedic Surgery

## 2023-09-11 DIAGNOSIS — Z96619 Presence of unspecified artificial shoulder joint: Secondary | ICD-10-CM | POA: Diagnosis not present

## 2023-09-11 DIAGNOSIS — M75122 Complete rotator cuff tear or rupture of left shoulder, not specified as traumatic: Secondary | ICD-10-CM

## 2023-09-11 NOTE — Progress Notes (Signed)
 Post Operative Evaluation    Procedure/Date of Surgery: Left shoulder reverse shoulder arthroplasty 5/6  Interval History:    Presents today 12 weeks status post the above procedure overall doing well.  She is doing extremely well at this time.   PMH/PSH/Family History/Social History/Meds/Allergies:    Past Medical History:  Diagnosis Date   Allergy     Asthma    Degenerative disc disease, lumbar    GERD (gastroesophageal reflux disease)    Hypertension    Hypothyroidism    Thyroid  disease    hypothyroidism   Ulcer    Past Surgical History:  Procedure Laterality Date   ABDOMINAL HYSTERECTOMY     DILATION AND CURETTAGE OF UTERUS     REVERSE SHOULDER ARTHROPLASTY Left 06/11/2023   Procedure: ARTHROPLASTY, SHOULDER, TOTAL, REVERSE;  Surgeon: Genelle Standing, MD;  Location: [facility];  Service: Orthopedics;  Laterality: Left;   ROTATOR CUFF REPAIR  11/30/2015   Right shoulder   Social History   Socioeconomic History   Marital status: Married    Spouse name: Lynwood   Number of children: 5   Years of education: 12   Highest education level: 12th grade  Occupational History    Employer: retired  Tobacco Use   Smoking status: Never    Passive exposure: Current (husband smokes 50+ years)   Smokeless tobacco: Never  Vaping Use   Vaping status: Never Used  Substance and Sexual Activity   Alcohol use: No   Drug use: No   Sexual activity: Not Currently    Birth control/protection: Surgical    Comment: hyst  Other Topics Concern   Not on file  Social History Narrative   Lives with husband. Has 3 daughters and 2 step-daughters - all live within an hour away   Social Drivers of Health   Financial Resource Strain: Low Risk  (07/08/2023)   Overall Financial Resource Strain (CARDIA)    Difficulty of Paying Living Expenses: Not hard at all  Food Insecurity: Low Risk  (06/04/2023)   Received from [facility]   Hunger Vital  Sign    Within the past 12 months, you worried that your food would run out before you got money to buy more: Never true    Within the past 12 months, the food you bought just didn't last and you didn't have money to get more. : Never true  Transportation Needs: No Transportation Needs (06/04/2023)   Received from [facility]    In the past 12 months, has lack of reliable transportation kept you from medical appointments, meetings, work or from getting things needed for daily living? : No  Physical Activity: Insufficiently Active (07/08/2023)   Exercise Vital Sign    Days of Exercise per Week: 7 days    Minutes of Exercise per Session: 20 min  Stress: No Stress Concern Present (07/08/2023)   [employer] of Occupational Health - Occupational Stress Questionnaire    Feeling of Stress : Not at all  Social Connections: Unknown (02/03/2023)   Social Connection and Isolation Panel    Frequency of Communication with Friends and Family: Patient declined    Frequency of Social Gatherings with Friends and Family: Patient declined    Attends Religious Services: Patient declined    Database administrator or Organizations: Patient  declined    Attends Engineer, structural: More than 4 times per year    Marital Status: Patient declined   Family History  Problem Relation Age of Onset   Arthritis Mother    Heart attack Father        53s MI   Heart disease Father    Allergic rhinitis Sister    Asthma Sister    Allergic rhinitis Sister    Allergic rhinitis Brother    Heart disease Brother    Cancer Brother        liver   Asthma Maternal Grandmother    Heart attack Maternal Grandmother    Breast cancer Maternal Grandmother    Healthy Daughter    Healthy Daughter    Healthy Daughter    Healthy Daughter    Healthy Daughter    Food Allergy  Niece    Thyroid  cancer Neg Hx    Colon cancer Neg Hx    Prostate cancer Neg Hx    Ovarian cancer Neg Hx    Angioedema Neg  Hx    Eczema Neg Hx    Atopy Neg Hx    Immunodeficiency Neg Hx    Allergies  Allergen Reactions   Anesthesia S-I-40 [Propofol ] Nausea And Vomiting    Per patient need to be careful when giving this   Current Outpatient Medications  Medication Sig Dispense Refill   albuterol  (VENTOLIN  HFA) 108 (90 Base) MCG/ACT inhaler Inhale 2 puffs into the lungs every 4 (four) hours as needed for wheezing or shortness of breath (coughing fits). 18 g 1   Azelastine HCl 137 MCG/SPRAY SOLN Place into the nose.     budesonide -formoterol  (SYMBICORT ) 160-4.5 MCG/ACT inhaler Inhale 2 puffs into the lungs 2 (two) times daily.     celecoxib  (CELEBREX ) 100 MG capsule TAKE 1 CAPSULE BY MOUTH TWICE A DAY 60 capsule 0   Cholecalciferol (VITAMIN D-3 PO) Take by mouth.     dicyclomine (BENTYL) 20 MG tablet Take 20 mg by mouth 3 (three) times daily.     doxepin (SINEQUAN) 10 MG capsule Take 10 mg by mouth at bedtime as needed.     fluticasone  (FLONASE ) 50 MCG/ACT nasal spray Place 2 sprays into both nostrils daily. 16 g 6   hydrOXYzine  (ATARAX ) 10 MG tablet Take 10 mg by mouth 3 (three) times daily as needed for itching.     levothyroxine  (EUTHYROX ) 75 MCG tablet Take 1 tablet (75 mcg total) by mouth daily before breakfast. 90 tablet 1   losartan -hydrochlorothiazide  (HYZAAR) 100-25 MG tablet Take 1 tablet by mouth daily. 90 tablet 3   meclizine  (ANTIVERT ) 25 MG tablet Take 1 tablet (25 mg total) by mouth 3 (three) times daily as needed for dizziness. 30 tablet 1   methocarbamol  (ROBAXIN ) 500 MG tablet Take 1 tablet (500 mg total) by mouth 4 (four) times daily. 40 tablet 0   montelukast  (SINGULAIR ) 10 MG tablet Take 1 tablet (10 mg total) by mouth at bedtime. 90 tablet 3   omeprazole  (PRILOSEC) 40 MG capsule Take 1 capsule (40 mg total) by mouth daily. 90 capsule 1   oxyCODONE  (ROXICODONE ) 5 MG immediate release tablet Take 1 tablet (5 mg total) by mouth every 4 (four) hours as needed for severe pain (pain score 7-10) or  breakthrough pain. 30 tablet 0   rosuvastatin  (CRESTOR ) 10 MG tablet Take 1 tablet (10 mg total) by mouth at bedtime. For cholesterol 90 tablet 3   Spacer/Aero-Holding Chambers (AEROCHAMBER PLUS) inhaler Use as instructed  1 each 2   No current facility-administered medications for this visit.   No results found.  Review of Systems:   A ROS was performed including pertinent positives and negatives as documented in the HPI.   Musculoskeletal Exam:    There were no vitals taken for this visit.  Left shoulder incisions well-appearing with some bruising without erythema or drainage.  In the spine position she can forward elevate to 90 degrees with external rotation at side to 30 degrees.  Distal neurosensory exam is intact with 2+ radial pulse  Imaging:    3 views left shoulder: Status post reverse shoulder arthroplasty without evidence of complication  I personally reviewed and interpreted the radiographs.   Assessment:   73 year old female who is 12 weeks status post left reverse shoulder arthroplasty without evidence of complication.  Overall she is doing extremely well.  I did advise on scar massage at today's visit.  I will plan to see her back in September for her right shoulder assessment Plan :    - Return to clinic 12 weeks for assessment of the right shoulder      I personally saw and evaluated the patient, and participated in the management and treatment plan.  Elspeth Parker, MD Attending Physician, Orthopedic Surgery  This document was dictated using Dragon voice recognition software. A reasonable attempt at proof reading has been made to minimize errors.

## 2023-09-13 ENCOUNTER — Encounter (HOSPITAL_BASED_OUTPATIENT_CLINIC_OR_DEPARTMENT_OTHER): Admitting: Orthopaedic Surgery

## 2023-09-28 ENCOUNTER — Other Ambulatory Visit: Payer: Self-pay | Admitting: Family Medicine

## 2023-09-28 DIAGNOSIS — I10 Essential (primary) hypertension: Secondary | ICD-10-CM

## 2023-09-30 ENCOUNTER — Other Ambulatory Visit: Payer: Self-pay | Admitting: Family Medicine

## 2023-09-30 DIAGNOSIS — I10 Essential (primary) hypertension: Secondary | ICD-10-CM

## 2023-09-30 DIAGNOSIS — E7800 Pure hypercholesterolemia, unspecified: Secondary | ICD-10-CM

## 2023-10-10 DIAGNOSIS — K581 Irritable bowel syndrome with constipation: Secondary | ICD-10-CM | POA: Diagnosis not present

## 2023-10-10 DIAGNOSIS — Z860100 Personal history of colon polyps, unspecified: Secondary | ICD-10-CM | POA: Diagnosis not present

## 2023-10-10 DIAGNOSIS — K219 Gastro-esophageal reflux disease without esophagitis: Secondary | ICD-10-CM | POA: Diagnosis not present

## 2023-10-10 DIAGNOSIS — M6289 Other specified disorders of muscle: Secondary | ICD-10-CM | POA: Diagnosis not present

## 2023-10-18 ENCOUNTER — Other Ambulatory Visit: Payer: Self-pay | Admitting: Family Medicine

## 2023-10-18 DIAGNOSIS — I10 Essential (primary) hypertension: Secondary | ICD-10-CM

## 2023-10-23 ENCOUNTER — Other Ambulatory Visit: Payer: Self-pay | Admitting: Family Medicine

## 2023-10-23 DIAGNOSIS — Z860101 Personal history of adenomatous and serrated colon polyps: Secondary | ICD-10-CM | POA: Diagnosis not present

## 2023-10-23 DIAGNOSIS — K5730 Diverticulosis of large intestine without perforation or abscess without bleeding: Secondary | ICD-10-CM | POA: Diagnosis not present

## 2023-10-23 DIAGNOSIS — Z09 Encounter for follow-up examination after completed treatment for conditions other than malignant neoplasm: Secondary | ICD-10-CM | POA: Diagnosis not present

## 2023-10-23 DIAGNOSIS — K635 Polyp of colon: Secondary | ICD-10-CM | POA: Diagnosis not present

## 2023-10-23 DIAGNOSIS — I10 Essential (primary) hypertension: Secondary | ICD-10-CM

## 2023-10-23 DIAGNOSIS — Z860100 Personal history of colon polyps, unspecified: Secondary | ICD-10-CM | POA: Diagnosis not present

## 2023-10-23 DIAGNOSIS — D120 Benign neoplasm of cecum: Secondary | ICD-10-CM | POA: Diagnosis not present

## 2023-10-23 DIAGNOSIS — Z1211 Encounter for screening for malignant neoplasm of colon: Secondary | ICD-10-CM | POA: Diagnosis not present

## 2023-10-24 DIAGNOSIS — R051 Acute cough: Secondary | ICD-10-CM | POA: Diagnosis not present

## 2023-10-24 DIAGNOSIS — J40 Bronchitis, not specified as acute or chronic: Secondary | ICD-10-CM | POA: Diagnosis not present

## 2023-10-24 DIAGNOSIS — R0602 Shortness of breath: Secondary | ICD-10-CM | POA: Diagnosis not present

## 2023-10-25 DIAGNOSIS — R09A2 Foreign body sensation, throat: Secondary | ICD-10-CM | POA: Diagnosis not present

## 2023-10-25 DIAGNOSIS — K1379 Other lesions of oral mucosa: Secondary | ICD-10-CM | POA: Diagnosis not present

## 2023-10-25 DIAGNOSIS — K219 Gastro-esophageal reflux disease without esophagitis: Secondary | ICD-10-CM | POA: Diagnosis not present

## 2023-11-01 ENCOUNTER — Ambulatory Visit (HOSPITAL_BASED_OUTPATIENT_CLINIC_OR_DEPARTMENT_OTHER): Admitting: Orthopaedic Surgery

## 2023-11-01 ENCOUNTER — Other Ambulatory Visit (HOSPITAL_BASED_OUTPATIENT_CLINIC_OR_DEPARTMENT_OTHER): Payer: Self-pay

## 2023-11-01 DIAGNOSIS — M5010 Cervical disc disorder with radiculopathy, unspecified cervical region: Secondary | ICD-10-CM

## 2023-11-01 DIAGNOSIS — M75122 Complete rotator cuff tear or rupture of left shoulder, not specified as traumatic: Secondary | ICD-10-CM

## 2023-11-01 MED ORDER — MELOXICAM 15 MG PO TABS
15.0000 mg | ORAL_TABLET | Freq: Every day | ORAL | 0 refills | Status: AC
  Filled 2023-11-01: qty 14, 14d supply, fill #0

## 2023-11-01 NOTE — Progress Notes (Signed)
 Post Operative Evaluation    Procedure/Date of Surgery: Left shoulder reverse shoulder arthroplasty 5/6  Interval History:    Presents today for follow-up of the left shoulder.  She states that she has been experiencing numbness down the small 2 fingers as well as pain that does radiate into the neck into the anterior aspect of the shoulder.  Denies any fevers or chills.  Has been dealing with issues with allergies and sinus issues as well for which she was recently placed on steroids.  This did not help her shoulder   PMH/PSH/Family History/Social History/Meds/Allergies:    Past Medical History:  Diagnosis Date   Allergy     Asthma    Degenerative disc disease, lumbar    GERD (gastroesophageal reflux disease)    Hypertension    Hypothyroidism    Thyroid  disease    hypothyroidism   Ulcer    Past Surgical History:  Procedure Laterality Date   ABDOMINAL HYSTERECTOMY     DILATION AND CURETTAGE OF UTERUS     REVERSE SHOULDER ARTHROPLASTY Left 06/11/2023   Procedure: ARTHROPLASTY, SHOULDER, TOTAL, REVERSE;  Surgeon: Genelle Standing, MD;  Location: [facility];  Service: Orthopedics;  Laterality: Left;   ROTATOR CUFF REPAIR  11/30/2015   Right shoulder   Social History   Socioeconomic History   Marital status: Married    Spouse name: Lynwood   Number of children: 5   Years of education: 12   Highest education level: 12th grade  Occupational History    Employer: retired  Tobacco Use   Smoking status: Never    Passive exposure: Current (husband smokes 50+ years)   Smokeless tobacco: Never  Vaping Use   Vaping status: Never Used  Substance and Sexual Activity   Alcohol use: No   Drug use: No   Sexual activity: Not Currently    Birth control/protection: Surgical    Comment: hyst  Other Topics Concern   Not on file  Social History Narrative   Lives with husband. Has 3 daughters and 2 step-daughters - all live within an hour  away   Social Drivers of Health   Financial Resource Strain: Patient Declined (10/08/2023)   Received from [facility]   Overall Financial Resource Strain (CARDIA)    How hard is it for you to pay for the very basics like food, housing, medical care, and heating?: Patient declined  Food Insecurity: Patient Declined (10/08/2023)   Received from [facility]   Hunger Vital Sign    Within the past 12 months, you worried that your food would run out before you got the money to buy more.: Patient declined    Within the past 12 months, the food you bought just didn't last and you didn't have money to get more.: Patient declined  Transportation Needs: Patient Declined (10/08/2023)   Received from [facility] - Transportation    In the past 12 months, has lack of transportation kept you from medical appointments or from getting medications?: Patient declined    In the past 12 months, has lack of transportation kept you from meetings, work, or from getting things needed for daily living?: Patient declined  Physical Activity: Unknown (10/08/2023)   Received from [facility]   Exercise Vital Sign    On average, how many days per  week do you engage in moderate to strenuous exercise (like a brisk walk)?: Patient declined    Minutes of Exercise per Session: Not on file  Stress: Patient Declined (10/08/2023)   Received from [facility] of Occupational Health - Occupational Stress Questionnaire    Do you feel stress - tense, restless, nervous, or anxious, or unable to sleep at night because your mind is troubled all the time - these days?: Patient declined  Social Connections: Patient Declined (10/08/2023)   Received from [facility]   Social Network    How would you rate your social network (family, work, friends)?: Patient declined   Family History  Problem Relation Age of Onset   Arthritis Mother    Heart attack Father        82s MI   Heart disease Father     Allergic rhinitis Sister    Asthma Sister    Allergic rhinitis Sister    Allergic rhinitis Brother    Heart disease Brother    Cancer Brother        liver   Asthma Maternal Grandmother    Heart attack Maternal Grandmother    Breast cancer Maternal Grandmother    Healthy Daughter    Healthy Daughter    Healthy Daughter    Healthy Daughter    Healthy Daughter    Food Allergy  Niece    Thyroid  cancer Neg Hx    Colon cancer Neg Hx    Prostate cancer Neg Hx    Ovarian cancer Neg Hx    Angioedema Neg Hx    Eczema Neg Hx    Atopy Neg Hx    Immunodeficiency Neg Hx    Allergies  Allergen Reactions   Anesthesia S-I-40 [Propofol ] Nausea And Vomiting    Per patient need to be careful when giving this   Current Outpatient Medications  Medication Sig Dispense Refill   meloxicam  (MOBIC ) 15 MG tablet Take 1 tablet (15 mg total) by mouth daily. 14 tablet 0   albuterol  (VENTOLIN  HFA) 108 (90 Base) MCG/ACT inhaler Inhale 2 puffs into the lungs every 4 (four) hours as needed for wheezing or shortness of breath (coughing fits). 18 g 1   Azelastine HCl 137 MCG/SPRAY SOLN Place into the nose.     budesonide -formoterol  (SYMBICORT ) 160-4.5 MCG/ACT inhaler Inhale 2 puffs into the lungs 2 (two) times daily.     celecoxib  (CELEBREX ) 100 MG capsule TAKE 1 CAPSULE BY MOUTH TWICE A DAY 60 capsule 0   Cholecalciferol (VITAMIN D-3 PO) Take by mouth.     dicyclomine (BENTYL) 20 MG tablet Take 20 mg by mouth 3 (three) times daily.     doxepin (SINEQUAN) 10 MG capsule Take 10 mg by mouth at bedtime as needed.     fluticasone  (FLONASE ) 50 MCG/ACT nasal spray Place 2 sprays into both nostrils daily. 16 g 6   hydrOXYzine  (ATARAX ) 10 MG tablet Take 10 mg by mouth 3 (three) times daily as needed for itching.     levothyroxine  (EUTHYROX ) 75 MCG tablet Take 1 tablet (75 mcg total) by mouth daily before breakfast. 90 tablet 1   losartan -hydrochlorothiazide  (HYZAAR) 100-25 MG tablet TAKE 1 TABLET BY MOUTH DAILY. (NEEDS  TO BE SEEN BEFORE NEXT REFILL) 90 tablet 1   meclizine  (ANTIVERT ) 25 MG tablet Take 1 tablet (25 mg total) by mouth 3 (three) times daily as needed for dizziness. 30 tablet 1   methocarbamol  (ROBAXIN ) 500 MG tablet Take 1 tablet (500 mg total)  by mouth 4 (four) times daily. 40 tablet 0   montelukast  (SINGULAIR ) 10 MG tablet Take 1 tablet (10 mg total) by mouth at bedtime. 90 tablet 3   omeprazole  (PRILOSEC) 40 MG capsule Take 1 capsule (40 mg total) by mouth daily. 90 capsule 1   oxyCODONE  (ROXICODONE ) 5 MG immediate release tablet Take 1 tablet (5 mg total) by mouth every 4 (four) hours as needed for severe pain (pain score 7-10) or breakthrough pain. 30 tablet 0   rosuvastatin  (CRESTOR ) 10 MG tablet Take 1 tablet (10 mg total) by mouth at bedtime. For cholesterol 90 tablet 3   Spacer/Aero-Holding Chambers (AEROCHAMBER PLUS) inhaler Use as instructed 1 each 2   No current facility-administered medications for this visit.   No results found.  Review of Systems:   A ROS was performed including pertinent positives and negatives as documented in the HPI.   Musculoskeletal Exam:    There were no vitals taken for this visit.  Left shoulder incisions well-appearing with some bruising without erythema or drainage.  In the spine position she can forward elevate to 90 degrees with external rotation at side to 30 degrees.  Distal neurosensory exam is intact with 2+ radial pulse  Imaging:    3 views left shoulder: Status post reverse shoulder arthroplasty without evidence of complication  I personally reviewed and interpreted the radiographs.   Assessment:   73 year old female who is having more acute pain in the shoulder.  There does appear to be radiation as well from the neck with numbness in the small fingers.  At this time this is worsening and causing significant pain.  This is limiting her rehab as well.  Given this I do believe an MRI is needed of her cervical spine to rule out an acute  disc herniation.  With regard to the left shoulder I would also like to obtain ESR and CRP values so that we can check and assess for any type of acute infection. Plan :    - Return to clinic following cervical MRI     I personally saw and evaluated the patient, and participated in the management and treatment plan.  Elspeth Parker, MD Attending Physician, Orthopedic Surgery  This document was dictated using Dragon voice recognition software. A reasonable attempt at proof reading has been made to minimize errors.

## 2023-11-05 ENCOUNTER — Telehealth: Payer: Self-pay

## 2023-11-05 NOTE — Telephone Encounter (Signed)
 Spoke with patient and advised her to speak to providers' office who performed colonoscopy, I informed patient that I do not see anything in PCP's notes regarding the medications or instructions that she is referring to,

## 2023-11-05 NOTE — Telephone Encounter (Signed)
 Copied from CRM #[id number]. Topic: Clinical - Medication Question >> Nov 05, 2023  1:20 PM Debby BROCKS wrote: Reason for CRM: Patient was provided two medications: dicyclomine (BENTYL) 20 MG tablet doxepin (SINEQUAN) 10 MG capsule  She was advised to stop taking one of them and continue on another but she cannot remember which to stop and which to take. All she remembers is that its for her stomach

## 2023-11-07 ENCOUNTER — Ambulatory Visit
Admission: RE | Admit: 2023-11-07 | Discharge: 2023-11-07 | Disposition: A | Source: Ambulatory Visit | Attending: Orthopaedic Surgery

## 2023-11-07 ENCOUNTER — Other Ambulatory Visit

## 2023-11-07 DIAGNOSIS — M5010 Cervical disc disorder with radiculopathy, unspecified cervical region: Secondary | ICD-10-CM

## 2023-11-07 DIAGNOSIS — M5412 Radiculopathy, cervical region: Secondary | ICD-10-CM | POA: Diagnosis not present

## 2023-11-07 DIAGNOSIS — M4802 Spinal stenosis, cervical region: Secondary | ICD-10-CM | POA: Diagnosis not present

## 2023-11-08 ENCOUNTER — Other Ambulatory Visit

## 2023-11-13 ENCOUNTER — Ambulatory Visit (HOSPITAL_BASED_OUTPATIENT_CLINIC_OR_DEPARTMENT_OTHER): Admitting: Orthopaedic Surgery

## 2023-11-13 ENCOUNTER — Ambulatory Visit (INDEPENDENT_AMBULATORY_CARE_PROVIDER_SITE_OTHER): Admitting: Orthopaedic Surgery

## 2023-11-13 DIAGNOSIS — M75122 Complete rotator cuff tear or rupture of left shoulder, not specified as traumatic: Secondary | ICD-10-CM | POA: Diagnosis not present

## 2023-11-13 NOTE — Progress Notes (Signed)
 Post Operative Evaluation    Procedure/Date of Surgery: Left shoulder reverse shoulder arthroplasty 5/6  Interval History:    Presents today for follow-up of the left shoulder.  At this time she has had continued pain about the acromial spine.  This was most recently after doing significant weed whacking.  She is here today for further discussion of MRI cervical spine   PMH/PSH/Family History/Social History/Meds/Allergies:    Past Medical History:  Diagnosis Date   Allergy     Asthma    Degenerative disc disease, lumbar    GERD (gastroesophageal reflux disease)    Hypertension    Hypothyroidism    Thyroid  disease    hypothyroidism   Ulcer    Past Surgical History:  Procedure Laterality Date   ABDOMINAL HYSTERECTOMY     DILATION AND CURETTAGE OF UTERUS     REVERSE SHOULDER ARTHROPLASTY Left 06/11/2023   Procedure: ARTHROPLASTY, SHOULDER, TOTAL, REVERSE;  Surgeon: Genelle Standing, MD;  Location: [facility];  Service: Orthopedics;  Laterality: Left;   ROTATOR CUFF REPAIR  11/30/2015   Right shoulder   Social History   Socioeconomic History   Marital status: Married    Spouse name: Lynwood   Number of children: 5   Years of education: 12   Highest education level: 12th grade  Occupational History    Employer: retired  Tobacco Use   Smoking status: Never    Passive exposure: Current (husband smokes 50+ years)   Smokeless tobacco: Never  Vaping Use   Vaping status: Never Used  Substance and Sexual Activity   Alcohol use: No   Drug use: No   Sexual activity: Not Currently    Birth control/protection: Surgical    Comment: hyst  Other Topics Concern   Not on file  Social History Narrative   Lives with husband. Has 3 daughters and 2 step-daughters - all live within an hour away   Social Drivers of Health   Financial Resource Strain: Patient Declined (10/08/2023)   Received from [facility]   Overall Financial  Resource Strain (CARDIA)    How hard is it for you to pay for the very basics like food, housing, medical care, and heating?: Patient declined  Food Insecurity: Patient Declined (10/08/2023)   Received from [facility]   Hunger Vital Sign    Within the past 12 months, you worried that your food would run out before you got the money to buy more.: Patient declined    Within the past 12 months, the food you bought just didn't last and you didn't have money to get more.: Patient declined  Transportation Needs: Patient Declined (10/08/2023)   Received from [facility] - Transportation    In the past 12 months, has lack of transportation kept you from medical appointments or from getting medications?: Patient declined    In the past 12 months, has lack of transportation kept you from meetings, work, or from getting things needed for daily living?: Patient declined  Physical Activity: Unknown (10/08/2023)   Received from [facility]   Exercise Vital Sign    On average, how many days per week do you engage in moderate to strenuous exercise (like a brisk walk)?: Patient declined    Minutes of Exercise per Session: Not on file  Stress: Patient Declined (  10/08/2023)   Received from [facility] of Occupational Health - Occupational Stress Questionnaire    Do you feel stress - tense, restless, nervous, or anxious, or unable to sleep at night because your mind is troubled all the time - these days?: Patient declined  Social Connections: Patient Declined (10/08/2023)   Received from [facility]   Social Network    How would you rate your social network (family, work, friends)?: Patient declined   Family History  Problem Relation Age of Onset   Arthritis Mother    Heart attack Father        21s MI   Heart disease Father    Allergic rhinitis Sister    Asthma Sister    Allergic rhinitis Sister    Allergic rhinitis Brother    Heart disease Brother    Cancer Brother         liver   Asthma Maternal Grandmother    Heart attack Maternal Grandmother    Breast cancer Maternal Grandmother    Healthy Daughter    Healthy Daughter    Healthy Daughter    Healthy Daughter    Healthy Daughter    Food Allergy  Niece    Thyroid  cancer Neg Hx    Colon cancer Neg Hx    Prostate cancer Neg Hx    Ovarian cancer Neg Hx    Angioedema Neg Hx    Eczema Neg Hx    Atopy Neg Hx    Immunodeficiency Neg Hx    Allergies  Allergen Reactions   Anesthesia S-I-40 [Propofol ] Nausea And Vomiting    Per patient need to be careful when giving this   Current Outpatient Medications  Medication Sig Dispense Refill   albuterol  (VENTOLIN  HFA) 108 (90 Base) MCG/ACT inhaler Inhale 2 puffs into the lungs every 4 (four) hours as needed for wheezing or shortness of breath (coughing fits). 18 g 1   Azelastine HCl 137 MCG/SPRAY SOLN Place into the nose.     budesonide -formoterol  (SYMBICORT ) 160-4.5 MCG/ACT inhaler Inhale 2 puffs into the lungs 2 (two) times daily.     celecoxib  (CELEBREX ) 100 MG capsule TAKE 1 CAPSULE BY MOUTH TWICE A DAY 60 capsule 0   Cholecalciferol (VITAMIN D-3 PO) Take by mouth.     dicyclomine (BENTYL) 20 MG tablet Take 20 mg by mouth 3 (three) times daily.     doxepin (SINEQUAN) 10 MG capsule Take 10 mg by mouth at bedtime as needed.     fluticasone  (FLONASE ) 50 MCG/ACT nasal spray Place 2 sprays into both nostrils daily. 16 g 6   hydrOXYzine  (ATARAX ) 10 MG tablet Take 10 mg by mouth 3 (three) times daily as needed for itching.     levothyroxine  (EUTHYROX ) 75 MCG tablet Take 1 tablet (75 mcg total) by mouth daily before breakfast. 90 tablet 1   losartan -hydrochlorothiazide  (HYZAAR) 100-25 MG tablet TAKE 1 TABLET BY MOUTH DAILY. (NEEDS TO BE SEEN BEFORE NEXT REFILL) 90 tablet 1   meclizine  (ANTIVERT ) 25 MG tablet Take 1 tablet (25 mg total) by mouth 3 (three) times daily as needed for dizziness. 30 tablet 1   meloxicam  (MOBIC ) 15 MG tablet Take 1 tablet (15 mg total)  by mouth daily. 14 tablet 0   methocarbamol  (ROBAXIN ) 500 MG tablet Take 1 tablet (500 mg total) by mouth 4 (four) times daily. 40 tablet 0   montelukast  (SINGULAIR ) 10 MG tablet Take 1 tablet (10 mg total) by mouth at bedtime. 90 tablet 3  omeprazole  (PRILOSEC) 40 MG capsule Take 1 capsule (40 mg total) by mouth daily. 90 capsule 1   oxyCODONE  (ROXICODONE ) 5 MG immediate release tablet Take 1 tablet (5 mg total) by mouth every 4 (four) hours as needed for severe pain (pain score 7-10) or breakthrough pain. 30 tablet 0   rosuvastatin  (CRESTOR ) 10 MG tablet Take 1 tablet (10 mg total) by mouth at bedtime. For cholesterol 90 tablet 3   Spacer/Aero-Holding Chambers (AEROCHAMBER PLUS) inhaler Use as instructed 1 each 2   No current facility-administered medications for this visit.   No results found.  Review of Systems:   A ROS was performed including pertinent positives and negatives as documented in the HPI.   Musculoskeletal Exam:    There were no vitals taken for this visit.  Left shoulder incisions well-appearing with some bruising without erythema or drainage.  In the spine position she can forward elevate to 90 degrees with external rotation at side to 30 degrees.  Distal neurosensory exam is intact with 2+ radial pulse  Imaging:    3 views left shoulder: Status post reverse shoulder arthroplasty without evidence of complication  MRI cervical spine: There is multiple degenerative disc disease although I do not see any significant herniations that I would believe would cause shooting radicular pain  I personally reviewed and interpreted the radiographs.   Assessment:   73 year old female who is having more acute pain in the shoulder.  At this time I did discuss that her inflammatory labs were negative and her MRI imaging was relatively benign although that being said I do believe the top of the differential diagnosis would more so be an acromial stress reaction.  Given this I  have asked her to be in a sling for 2 weeks.  I will plan to see her back in 2 weeks for reassessment.  Will possibly consider a cervical injection if no relief although she would like to defer this Plan :    - Return to clinic 2 weeks     I personally saw and evaluated the patient, and participated in the management and treatment plan.  Elspeth Parker, MD Attending Physician, Orthopedic Surgery  This document was dictated using Dragon voice recognition software. A reasonable attempt at proof reading has been made to minimize errors.

## 2023-11-20 ENCOUNTER — Ambulatory Visit (HOSPITAL_BASED_OUTPATIENT_CLINIC_OR_DEPARTMENT_OTHER): Admitting: Orthopaedic Surgery

## 2023-11-27 ENCOUNTER — Ambulatory Visit: Admitting: Physical Therapy

## 2023-11-27 ENCOUNTER — Ambulatory Visit (INDEPENDENT_AMBULATORY_CARE_PROVIDER_SITE_OTHER): Admitting: Orthopaedic Surgery

## 2023-11-27 ENCOUNTER — Encounter: Payer: Self-pay | Admitting: Physical Therapy

## 2023-11-27 ENCOUNTER — Other Ambulatory Visit (HOSPITAL_BASED_OUTPATIENT_CLINIC_OR_DEPARTMENT_OTHER): Payer: Self-pay | Admitting: Orthopaedic Surgery

## 2023-11-27 DIAGNOSIS — M25512 Pain in left shoulder: Secondary | ICD-10-CM

## 2023-11-27 DIAGNOSIS — M75122 Complete rotator cuff tear or rupture of left shoulder, not specified as traumatic: Secondary | ICD-10-CM | POA: Diagnosis not present

## 2023-11-27 NOTE — Progress Notes (Signed)
 Post Operative Evaluation    Procedure/Date of Surgery: Left shoulder reverse shoulder arthroplasty 5/6  Interval History:    Presents today for follow-up of the left shoulder.  She is overall feeling much better with prior to the posterior shoulder pain but has been having more pain about the conjoined tendon at today's visit   PMH/PSH/Family History/Social History/Meds/Allergies:    Past Medical History:  Diagnosis Date   Allergy     Asthma    Degenerative disc disease, lumbar    GERD (gastroesophageal reflux disease)    Hypertension    Hypothyroidism    Thyroid  disease    hypothyroidism   Ulcer    Past Surgical History:  Procedure Laterality Date   ABDOMINAL HYSTERECTOMY     DILATION AND CURETTAGE OF UTERUS     REVERSE SHOULDER ARTHROPLASTY Left 06/11/2023   Procedure: ARTHROPLASTY, SHOULDER, TOTAL, REVERSE;  Surgeon: Genelle Standing, MD;  Location: [facility];  Service: Orthopedics;  Laterality: Left;   ROTATOR CUFF REPAIR  11/30/2015   Right shoulder   Social History   Socioeconomic History   Marital status: Married    Spouse name: Lynwood   Number of children: 5   Years of education: 12   Highest education level: 12th grade  Occupational History    Employer: retired  Tobacco Use   Smoking status: Never    Passive exposure: Current (husband smokes 50+ years)   Smokeless tobacco: Never  Vaping Use   Vaping status: Never Used  Substance and Sexual Activity   Alcohol use: No   Drug use: No   Sexual activity: Not Currently    Birth control/protection: Surgical    Comment: hyst  Other Topics Concern   Not on file  Social History Narrative   Lives with husband. Has 3 daughters and 2 step-daughters - all live within an hour away   Social Drivers of Health   Financial Resource Strain: Patient Declined (10/08/2023)   Received from [facility]   Overall Financial Resource Strain (CARDIA)    How hard is it for  you to pay for the very basics like food, housing, medical care, and heating?: Patient declined  Food Insecurity: Patient Declined (10/08/2023)   Received from [facility]   Hunger Vital Sign    Within the past 12 months, you worried that your food would run out before you got the money to buy more.: Patient declined    Within the past 12 months, the food you bought just didn't last and you didn't have money to get more.: Patient declined  Transportation Needs: Patient Declined (10/08/2023)   Received from [facility] - Transportation    In the past 12 months, has lack of transportation kept you from medical appointments or from getting medications?: Patient declined    In the past 12 months, has lack of transportation kept you from meetings, work, or from getting things needed for daily living?: Patient declined  Physical Activity: Unknown (10/08/2023)   Received from [facility]   Exercise Vital Sign    On average, how many days per week do you engage in moderate to strenuous exercise (like a brisk walk)?: Patient declined    Minutes of Exercise per Session: Not on file  Stress: Patient Declined (10/08/2023)   Received from [facility]  [employer] of Occupational Health - Occupational Stress Questionnaire    Do you feel stress - tense, restless, nervous, or anxious, or unable to sleep at night because your mind is troubled all the time - these days?: Patient declined  Social Connections: Patient Declined (10/08/2023)   Received from [facility]   Social Network    How would you rate your social network (family, work, friends)?: Patient declined   Family History  Problem Relation Age of Onset   Arthritis Mother    Heart attack Father        2s MI   Heart disease Father    Allergic rhinitis Sister    Asthma Sister    Allergic rhinitis Sister    Allergic rhinitis Brother    Heart disease Brother    Cancer Brother        liver   Asthma Maternal Grandmother     Heart attack Maternal Grandmother    Breast cancer Maternal Grandmother    Healthy Daughter    Healthy Daughter    Healthy Daughter    Healthy Daughter    Healthy Daughter    Food Allergy  Niece    Thyroid  cancer Neg Hx    Colon cancer Neg Hx    Prostate cancer Neg Hx    Ovarian cancer Neg Hx    Angioedema Neg Hx    Eczema Neg Hx    Atopy Neg Hx    Immunodeficiency Neg Hx    Allergies  Allergen Reactions   Anesthesia S-I-40 [Propofol ] Nausea And Vomiting    Per patient need to be careful when giving this   Current Outpatient Medications  Medication Sig Dispense Refill   albuterol  (VENTOLIN  HFA) 108 (90 Base) MCG/ACT inhaler Inhale 2 puffs into the lungs every 4 (four) hours as needed for wheezing or shortness of breath (coughing fits). 18 g 1   Azelastine HCl 137 MCG/SPRAY SOLN Place into the nose.     budesonide -formoterol  (SYMBICORT ) 160-4.5 MCG/ACT inhaler Inhale 2 puffs into the lungs 2 (two) times daily.     celecoxib  (CELEBREX ) 100 MG capsule TAKE 1 CAPSULE BY MOUTH TWICE A DAY 60 capsule 0   Cholecalciferol (VITAMIN D-3 PO) Take by mouth.     dicyclomine (BENTYL) 20 MG tablet Take 20 mg by mouth 3 (three) times daily.     doxepin (SINEQUAN) 10 MG capsule Take 10 mg by mouth at bedtime as needed.     fluticasone  (FLONASE ) 50 MCG/ACT nasal spray Place 2 sprays into both nostrils daily. 16 g 6   hydrOXYzine  (ATARAX ) 10 MG tablet Take 10 mg by mouth 3 (three) times daily as needed for itching.     levothyroxine  (EUTHYROX ) 75 MCG tablet Take 1 tablet (75 mcg total) by mouth daily before breakfast. 90 tablet 1   losartan -hydrochlorothiazide  (HYZAAR) 100-25 MG tablet TAKE 1 TABLET BY MOUTH DAILY. (NEEDS TO BE SEEN BEFORE NEXT REFILL) 90 tablet 1   meclizine  (ANTIVERT ) 25 MG tablet Take 1 tablet (25 mg total) by mouth 3 (three) times daily as needed for dizziness. 30 tablet 1   meloxicam  (MOBIC ) 15 MG tablet Take 1 tablet (15 mg total) by mouth daily. 14 tablet 0   methocarbamol   (ROBAXIN ) 500 MG tablet Take 1 tablet (500 mg total) by mouth 4 (four) times daily. 40 tablet 0   montelukast  (SINGULAIR ) 10 MG tablet Take 1 tablet (10 mg total) by mouth at bedtime. 90 tablet 3   omeprazole  (PRILOSEC) 40 MG capsule Take 1 capsule (  40 mg total) by mouth daily. 90 capsule 1   oxyCODONE  (ROXICODONE ) 5 MG immediate release tablet Take 1 tablet (5 mg total) by mouth every 4 (four) hours as needed for severe pain (pain score 7-10) or breakthrough pain. 30 tablet 0   rosuvastatin  (CRESTOR ) 10 MG tablet Take 1 tablet (10 mg total) by mouth at bedtime. For cholesterol 90 tablet 3   Spacer/Aero-Holding Chambers (AEROCHAMBER PLUS) inhaler Use as instructed 1 each 2   No current facility-administered medications for this visit.   No results found.  Review of Systems:   A ROS was performed including pertinent positives and negatives as documented in the HPI.   Musculoskeletal Exam:    There were no vitals taken for this visit.  Left shoulder incisions well-appearing with some bruising without erythema or drainage.  In the spine position she can forward elevate to 90 degrees with external rotation at side to 30 degrees.  Distal neurosensory exam is intact with 2+ radial pulse  Imaging:    3 views left shoulder: Status post reverse shoulder arthroplasty without evidence of complication  MRI cervical spine: There is multiple degenerative disc disease although I do not see any significant herniations that I would believe would cause shooting radicular pain  I personally reviewed and interpreted the radiographs.   Assessment:   73 year old female who is having more acute pain in the shoulder.  At today's visit she has had an improvement of her acromial type pain but is now having more significant tenderness about her conjoined tendon.  Given this we will plan to engage her with physical therapy to work on mobilization of this.  I will plan to see her back in 1 month for  reassessment Plan :    - Return to clinic 4 weeks     I personally saw and evaluated the patient, and participated in the management and treatment plan.  Elspeth Parker, MD Attending Physician, Orthopedic Surgery  This document was dictated using Dragon voice recognition software. A reasonable attempt at proof reading has been made to minimize errors.

## 2023-11-27 NOTE — Therapy (Signed)
  OUTPATIENT PHYSICAL THERAPY SCREEN @[address]   Patient Name: Diane Grant MRN: [id number] DOB:[birth year], 73 y.o., female Today's Date: 11/27/2023  END OF SESSION:  PT End of Session - 11/27/23 1030     Visit Number 1    Activity Tolerance Patient tolerated treatment well          Past Medical History:  Diagnosis Date   Allergy     Asthma    Degenerative disc disease, lumbar    GERD (gastroesophageal reflux disease)    Hypertension    Hypothyroidism    Thyroid  disease    hypothyroidism   Ulcer    Past Surgical History:  Procedure Laterality Date   ABDOMINAL HYSTERECTOMY     DILATION AND CURETTAGE OF UTERUS     REVERSE SHOULDER ARTHROPLASTY Left 06/11/2023   Procedure: ARTHROPLASTY, SHOULDER, TOTAL, REVERSE;  Surgeon: Genelle Standing, MD;  Location: [facility];  Service: Orthopedics;  Laterality: Left;   ROTATOR CUFF REPAIR  11/30/2015   Right shoulder   Patient Active Problem List   Diagnosis Date Noted   Nontraumatic complete tear of left rotator cuff 06/11/2023   Moderate persistent asthma without complication 04/24/2023   Laryngopharyngeal reflux (LPR) 04/24/2023   Allergy  to alpha-gal 04/24/2023   Second hand tobacco smoke exposure 06/15/2021   Other allergic rhinitis 06/15/2021   Other adverse food reactions, not elsewhere classified, subsequent encounter 06/15/2021   Irritable bowel syndrome with constipation 11/16/2020   Cough 03/29/2020   Acute pharyngitis 03/29/2020   Urticaria 11/27/2019   Palpitations 06/16/2018   Chest pain of uncertain etiology 06/16/2018   Shortness of breath 06/16/2018   Educated about COVID-19 virus infection 06/16/2018   Change in voice 03/08/2017   Degenerative disc disease, lumbar 03/08/2017   Hypothyroidism 06/26/2013   Essential hypertension 06/26/2013     THERAPY DIAG:  Acute pain of left shoulder  Goal of screen:  This patient was referred to Physical Therapy specialty screen  by Standing Genelle, MD for HEP.   Medbridge HEP code:  Access Code: [email or web address] URL: [email or web address] Date: 11/27/2023 Prepared by: Harlene Cordon  Exercises - Seated Scapular Retraction  - 7 x weekly - Standing Upper Trapezius Stretch  - 2-3 x daily - 7 x weekly - 2 sets - 3 breaths hold - Seated Shoulder External Rotation  - 3 x daily - 7 x weekly - 1 sets - 10 reps - Standing Shoulder Flexion Full Range  - 3 x daily - 7 x weekly - 1 sets - 10 reps - Standing Shoulder Abduction Full Range  - 3 x daily - 7 x weekly - 1 sets - 10 reps [email or web address]  Clinical Impression & Plan:  IASTM applied to anterior shoulder and at incision. Discussed importance of scar mobility & encouraged use of lotion for moisture. Tendency for GHJ elevation with cues for retraction and overuse of upper trap is pulling on anterior shoulder as well as creating compression superior-laterally. Encouraged pt to use mirror for all full range motions to decrease habit of hiking.    Harlene Cordon PT, DPT 11/27/2023, 10:30 AM  [address] [place], [place] [address] [phone]   Note: charges not applied for screen.

## 2023-11-29 DIAGNOSIS — E7800 Pure hypercholesterolemia, unspecified: Secondary | ICD-10-CM

## 2023-11-29 NOTE — Progress Notes (Unsigned)
   11/29/2023  Patient ID: Diane Grant, female   DOB: 10/06/1950, 73 y.o.   MRN: [id number]  This patient is appearing on a report for being at risk of failing the adherence measure for cholesterol (statin) medications this calendar year.   Medication: Rosuvastatin  10 mg tablets Last fill date: 07/10/23 for 90 day supply  MyChart message sent to patient.  Diane Grant [facility] PharmD Candidate Class of [phone]

## 2023-12-09 ENCOUNTER — Encounter: Payer: Self-pay | Admitting: Radiology

## 2023-12-09 NOTE — Progress Notes (Unsigned)
   12/09/2023  Patient ID: Diane Grant, female   DOB: 1950/12/05, 73 y.o.   MRN: [id number]  Patient appearing on payer quality report for being at risk of failing adherence metric for her statin.  She does endorse needing a refill on her rosuvastatin  10mg  as well as montelukast  10mg .  Both prescriptions were last written in August of 2024 and have expired.  Patient was seen by Dr. Colette for a yearly physical in March of this year, and her lipid panel was within normal limits.  She was seen again via telehealth visit for her Medicare AWV in May.  Orders pending for a 90 day supply plus 1 additional refill to get patient through until she will be due for her next yearly physical.  Diane Grant, PharmD, DPLA

## 2023-12-10 MED ORDER — ROSUVASTATIN CALCIUM 10 MG PO TABS: 10.0000 mg | ORAL_TABLET | Freq: Every day | ORAL | 1 refills | Status: AC

## 2023-12-10 MED ORDER — MONTELUKAST SODIUM 10 MG PO TABS: 10.0000 mg | ORAL_TABLET | Freq: Every day | ORAL | 1 refills | Status: AC

## 2024-01-19 ENCOUNTER — Other Ambulatory Visit: Payer: Self-pay | Admitting: Family Medicine

## 2024-01-19 DIAGNOSIS — K219 Gastro-esophageal reflux disease without esophagitis: Secondary | ICD-10-CM

## 2024-03-06 ENCOUNTER — Other Ambulatory Visit: Payer: Self-pay | Admitting: Family Medicine

## 2024-03-06 DIAGNOSIS — Z1231 Encounter for screening mammogram for malignant neoplasm of breast: Secondary | ICD-10-CM

## 2024-03-08 ENCOUNTER — Telehealth: Payer: Self-pay | Admitting: Family Medicine

## 2024-03-08 DIAGNOSIS — R1084 Generalized abdominal pain: Secondary | ICD-10-CM

## 2024-03-08 DIAGNOSIS — R194 Change in bowel habit: Secondary | ICD-10-CM

## 2024-03-08 NOTE — Telephone Encounter (Signed)
 Copied from CRM #[id number]. Topic: Referral - Request for Referral >> Mar 06, 2024  3:52 PM Antwanette L wrote: The patient received a letter from [facility] stating that her insurance provider ([employer]) is requesting an insurance referral before she can be seen at [facility].

## 2024-03-08 NOTE — Telephone Encounter (Signed)
 I can place referral but need indication for referral ?

## 2024-03-09 NOTE — Telephone Encounter (Addendum)
 I have placed a referral to [facility].

## 2024-03-09 NOTE — Telephone Encounter (Signed)
 Pt states she had troubles with stomach pains in the past that have recently flared up. Pt is experiencing lower left sharp abdominal pain- alongside change in bowel movements.

## 2024-03-10 ENCOUNTER — Encounter: Payer: Self-pay | Admitting: Family Medicine

## 2024-03-11 NOTE — Telephone Encounter (Signed)
 Copied from CRM [id number]. Topic: Referral - Question >> Mar 11, 2024 10:52 AM Diane Grant wrote: Reason for CRM: Pt was advised that she is needing a referral or prior authorization from [employer] for  [employer]. Please call pt w any updates on #[phone]

## 2024-03-11 NOTE — Telephone Encounter (Addendum)
 Hello Diane Grant,  Per the patient, the GI referral information must be sent to [facility] as well. Is this a request you can help with? Thanks in advance.

## 2024-04-01 ENCOUNTER — Ambulatory Visit
